# Patient Record
Sex: Male | Born: 1945 | Race: Black or African American | Hispanic: No | Marital: Married | Smoking: Former smoker
Health system: Southern US, Community
[De-identification: ages and names within clinical notes are randomized; demographics above are authoritative.]

## PROBLEM LIST (undated history)

## (undated) DIAGNOSIS — R972 Elevated prostate specific antigen [PSA]: Secondary | ICD-10-CM

## (undated) DIAGNOSIS — E538 Deficiency of other specified B group vitamins: Secondary | ICD-10-CM

## (undated) DIAGNOSIS — H9319 Tinnitus, unspecified ear: Secondary | ICD-10-CM

## (undated) DIAGNOSIS — R350 Frequency of micturition: Secondary | ICD-10-CM

## (undated) DIAGNOSIS — Z973 Presence of spectacles and contact lenses: Secondary | ICD-10-CM

## (undated) DIAGNOSIS — Z8619 Personal history of other infectious and parasitic diseases: Secondary | ICD-10-CM

## (undated) DIAGNOSIS — R351 Nocturia: Secondary | ICD-10-CM

## (undated) DIAGNOSIS — Z87898 Personal history of other specified conditions: Secondary | ICD-10-CM

## (undated) DIAGNOSIS — E785 Hyperlipidemia, unspecified: Secondary | ICD-10-CM

## (undated) DIAGNOSIS — M25551 Pain in right hip: Secondary | ICD-10-CM

## (undated) DIAGNOSIS — H9190 Unspecified hearing loss, unspecified ear: Secondary | ICD-10-CM

## (undated) DIAGNOSIS — M1990 Unspecified osteoarthritis, unspecified site: Secondary | ICD-10-CM

## (undated) DIAGNOSIS — Z86010 Personal history of colonic polyps: Secondary | ICD-10-CM

## (undated) DIAGNOSIS — T8859XA Other complications of anesthesia, initial encounter: Secondary | ICD-10-CM

## (undated) DIAGNOSIS — K429 Umbilical hernia without obstruction or gangrene: Secondary | ICD-10-CM

## (undated) DIAGNOSIS — K432 Incisional hernia without obstruction or gangrene: Secondary | ICD-10-CM

## (undated) DIAGNOSIS — R05 Cough: Secondary | ICD-10-CM

## (undated) DIAGNOSIS — M2550 Pain in unspecified joint: Secondary | ICD-10-CM

## (undated) DIAGNOSIS — M545 Low back pain, unspecified: Secondary | ICD-10-CM

## (undated) DIAGNOSIS — R490 Dysphonia: Secondary | ICD-10-CM

## (undated) DIAGNOSIS — J4489 Other specified chronic obstructive pulmonary disease: Secondary | ICD-10-CM

## (undated) DIAGNOSIS — M25561 Pain in right knee: Secondary | ICD-10-CM

## (undated) DIAGNOSIS — M791 Myalgia, unspecified site: Secondary | ICD-10-CM

## (undated) DIAGNOSIS — R0602 Shortness of breath: Secondary | ICD-10-CM

## (undated) DIAGNOSIS — R062 Wheezing: Secondary | ICD-10-CM

## (undated) DIAGNOSIS — E119 Type 2 diabetes mellitus without complications: Secondary | ICD-10-CM

## (undated) DIAGNOSIS — R059 Cough, unspecified: Secondary | ICD-10-CM

## (undated) DIAGNOSIS — M6289 Other specified disorders of muscle: Secondary | ICD-10-CM

## (undated) DIAGNOSIS — Z860101 Personal history of adenomatous and serrated colon polyps: Secondary | ICD-10-CM

## (undated) DIAGNOSIS — J449 Chronic obstructive pulmonary disease, unspecified: Secondary | ICD-10-CM

## (undated) DIAGNOSIS — K219 Gastro-esophageal reflux disease without esophagitis: Secondary | ICD-10-CM

## (undated) DIAGNOSIS — M436 Torticollis: Secondary | ICD-10-CM

## (undated) DIAGNOSIS — G479 Sleep disorder, unspecified: Secondary | ICD-10-CM

## (undated) DIAGNOSIS — E559 Vitamin D deficiency, unspecified: Secondary | ICD-10-CM

## (undated) DIAGNOSIS — I10 Essential (primary) hypertension: Secondary | ICD-10-CM

## (undated) DIAGNOSIS — C801 Malignant (primary) neoplasm, unspecified: Secondary | ICD-10-CM

## (undated) DIAGNOSIS — H43399 Other vitreous opacities, unspecified eye: Secondary | ICD-10-CM

## (undated) DIAGNOSIS — J45909 Unspecified asthma, uncomplicated: Secondary | ICD-10-CM

## (undated) HISTORY — DX: Wheezing: R06.2

## (undated) HISTORY — DX: Malignant (primary) neoplasm, unspecified: C80.1

## (undated) HISTORY — DX: Unspecified osteoarthritis, unspecified site: M19.90

## (undated) HISTORY — DX: Cough: R05

## (undated) HISTORY — DX: Pain in right knee: M25.561

## (undated) HISTORY — DX: Vitamin D deficiency, unspecified: E55.9

## (undated) HISTORY — DX: Hyperlipidemia, unspecified: E78.5

## (undated) HISTORY — DX: Frequency of micturition: R35.0

## (undated) HISTORY — DX: Umbilical hernia without obstruction or gangrene: K42.9

## (undated) HISTORY — DX: Low back pain: M54.5

## (undated) HISTORY — DX: Shortness of breath: R06.02

## (undated) HISTORY — PX: HEMORRHOID SURGERY: SHX153

## (undated) HISTORY — DX: Deficiency of other specified B group vitamins: E53.8

## (undated) HISTORY — DX: Type 2 diabetes mellitus without complications: E11.9

## (undated) HISTORY — DX: Essential (primary) hypertension: I10

## (undated) HISTORY — DX: Sleep disorder, unspecified: G47.9

## (undated) HISTORY — DX: Pain in right hip: M25.551

## (undated) HISTORY — DX: Dysphonia: R49.0

## (undated) HISTORY — DX: Torticollis: M43.6

## (undated) HISTORY — DX: Unspecified asthma, uncomplicated: J45.909

## (undated) HISTORY — DX: Other specified disorders of muscle: M62.89

## (undated) HISTORY — PX: KNEE ARTHROSCOPY: SUR90

## (undated) HISTORY — DX: Unspecified hearing loss, unspecified ear: H91.90

## (undated) HISTORY — DX: Tinnitus, unspecified ear: H93.19

## (undated) HISTORY — DX: Myalgia, unspecified site: M79.10

## (undated) HISTORY — DX: Pain in unspecified joint: M25.50

## (undated) HISTORY — DX: Low back pain, unspecified: M54.50

## (undated) HISTORY — DX: Other vitreous opacities, unspecified eye: H43.399

## (undated) HISTORY — PX: ESOPHAGOGASTRODUODENOSCOPY: SHX1529

## (undated) HISTORY — DX: Cough, unspecified: R05.9

---

## 1972-07-09 HISTORY — PX: HEMORRHOID SURGERY: SHX153

## 1983-07-10 HISTORY — PX: KNEE ARTHROSCOPY: SUR90

## 1988-07-09 HISTORY — PX: KNEE ARTHROSCOPY: SUR90

## 1998-07-09 HISTORY — PX: COLONOSCOPY: SHX174

## 1998-07-09 HISTORY — PX: PROSTATE SURGERY: SHX751

## 2001-05-20 ENCOUNTER — Encounter: Payer: Self-pay | Admitting: Internal Medicine

## 2001-05-20 ENCOUNTER — Ambulatory Visit (HOSPITAL_COMMUNITY): Admission: RE | Admit: 2001-05-20 | Discharge: 2001-05-20 | Payer: Self-pay | Admitting: Internal Medicine

## 2001-10-08 ENCOUNTER — Encounter: Payer: Self-pay | Admitting: Emergency Medicine

## 2001-10-08 ENCOUNTER — Emergency Department (HOSPITAL_COMMUNITY): Admission: EM | Admit: 2001-10-08 | Discharge: 2001-10-08 | Payer: Self-pay | Admitting: Emergency Medicine

## 2002-09-09 ENCOUNTER — Ambulatory Visit (HOSPITAL_COMMUNITY): Admission: RE | Admit: 2002-09-09 | Discharge: 2002-09-09 | Payer: Self-pay | Admitting: Orthopaedic Surgery

## 2002-09-09 ENCOUNTER — Encounter: Payer: Self-pay | Admitting: Orthopaedic Surgery

## 2002-11-26 ENCOUNTER — Ambulatory Visit (HOSPITAL_COMMUNITY)
Admission: RE | Admit: 2002-11-26 | Discharge: 2002-11-26 | Payer: Self-pay | Admitting: Physical Medicine and Rehabilitation

## 2002-11-26 ENCOUNTER — Encounter: Payer: Self-pay | Admitting: Physical Medicine and Rehabilitation

## 2003-02-01 ENCOUNTER — Encounter: Payer: Self-pay | Admitting: Emergency Medicine

## 2003-02-01 ENCOUNTER — Emergency Department (HOSPITAL_COMMUNITY): Admission: EM | Admit: 2003-02-01 | Discharge: 2003-02-01 | Payer: Self-pay | Admitting: Emergency Medicine

## 2005-07-09 DIAGNOSIS — C61 Malignant neoplasm of prostate: Secondary | ICD-10-CM

## 2005-07-09 DIAGNOSIS — R9721 Rising PSA following treatment for malignant neoplasm of prostate: Secondary | ICD-10-CM

## 2005-07-09 HISTORY — DX: Malignant neoplasm of prostate: C61

## 2005-07-09 HISTORY — DX: Rising PSA following treatment for malignant neoplasm of prostate: R97.21

## 2005-07-09 HISTORY — PX: PROSTATECTOMY: SHX69

## 2006-07-09 HISTORY — PX: COLONOSCOPY: SHX174

## 2007-02-11 ENCOUNTER — Ambulatory Visit (HOSPITAL_COMMUNITY): Admission: RE | Admit: 2007-02-11 | Discharge: 2007-02-11 | Payer: Self-pay | Admitting: Internal Medicine

## 2007-02-27 ENCOUNTER — Ambulatory Visit (HOSPITAL_COMMUNITY): Admission: RE | Admit: 2007-02-27 | Discharge: 2007-02-27 | Payer: Self-pay | Admitting: Internal Medicine

## 2007-02-27 ENCOUNTER — Ambulatory Visit: Payer: Self-pay | Admitting: Internal Medicine

## 2007-02-28 ENCOUNTER — Encounter: Payer: Self-pay | Admitting: Internal Medicine

## 2008-05-03 ENCOUNTER — Ambulatory Visit (HOSPITAL_COMMUNITY): Admission: RE | Admit: 2008-05-03 | Discharge: 2008-05-03 | Payer: Self-pay | Admitting: Internal Medicine

## 2008-08-16 ENCOUNTER — Ambulatory Visit (HOSPITAL_COMMUNITY): Admission: RE | Admit: 2008-08-16 | Discharge: 2008-08-16 | Payer: Self-pay | Admitting: Internal Medicine

## 2008-12-26 IMAGING — CR DG CHEST 2V
2 series · 2 of 2 positions shown · non-contrast
Comparison: None.

Exam: Chest, 2 views.

HISTORY: Bronchitis.

[view not recorded (1 of 2)]
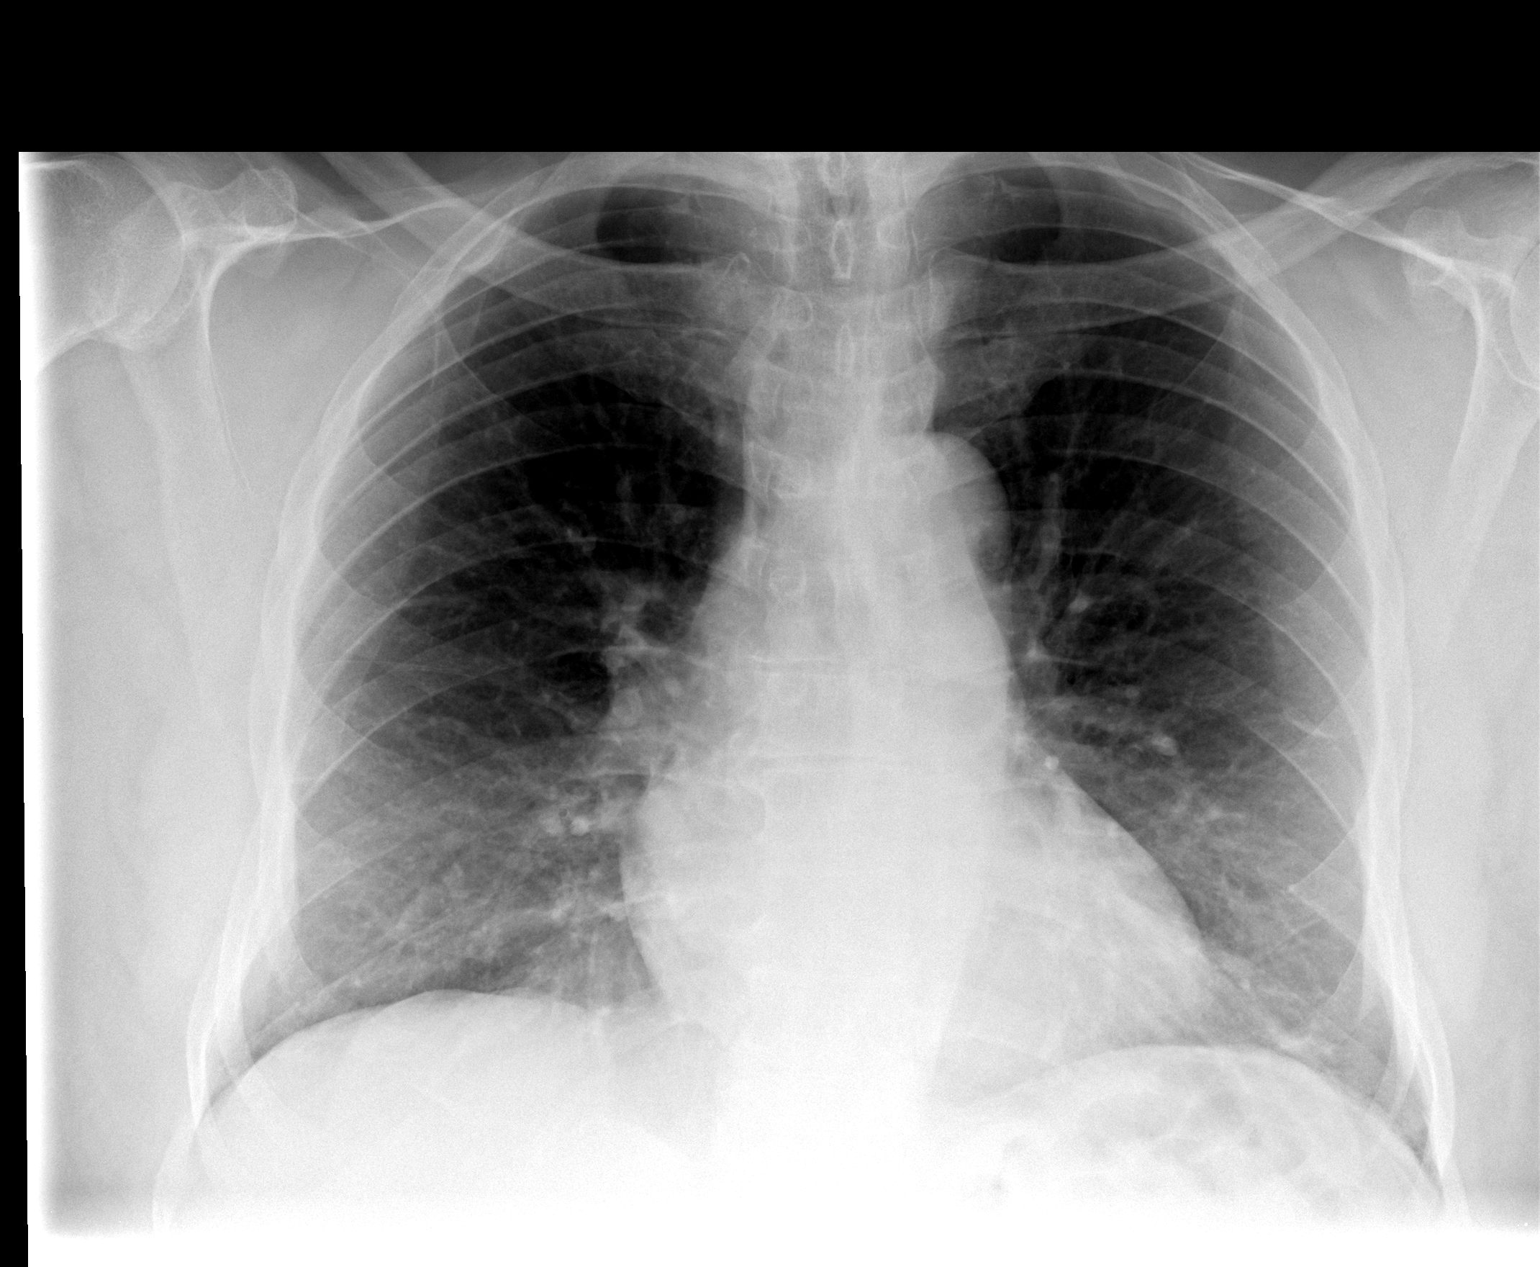

[view not recorded (2 of 2)]
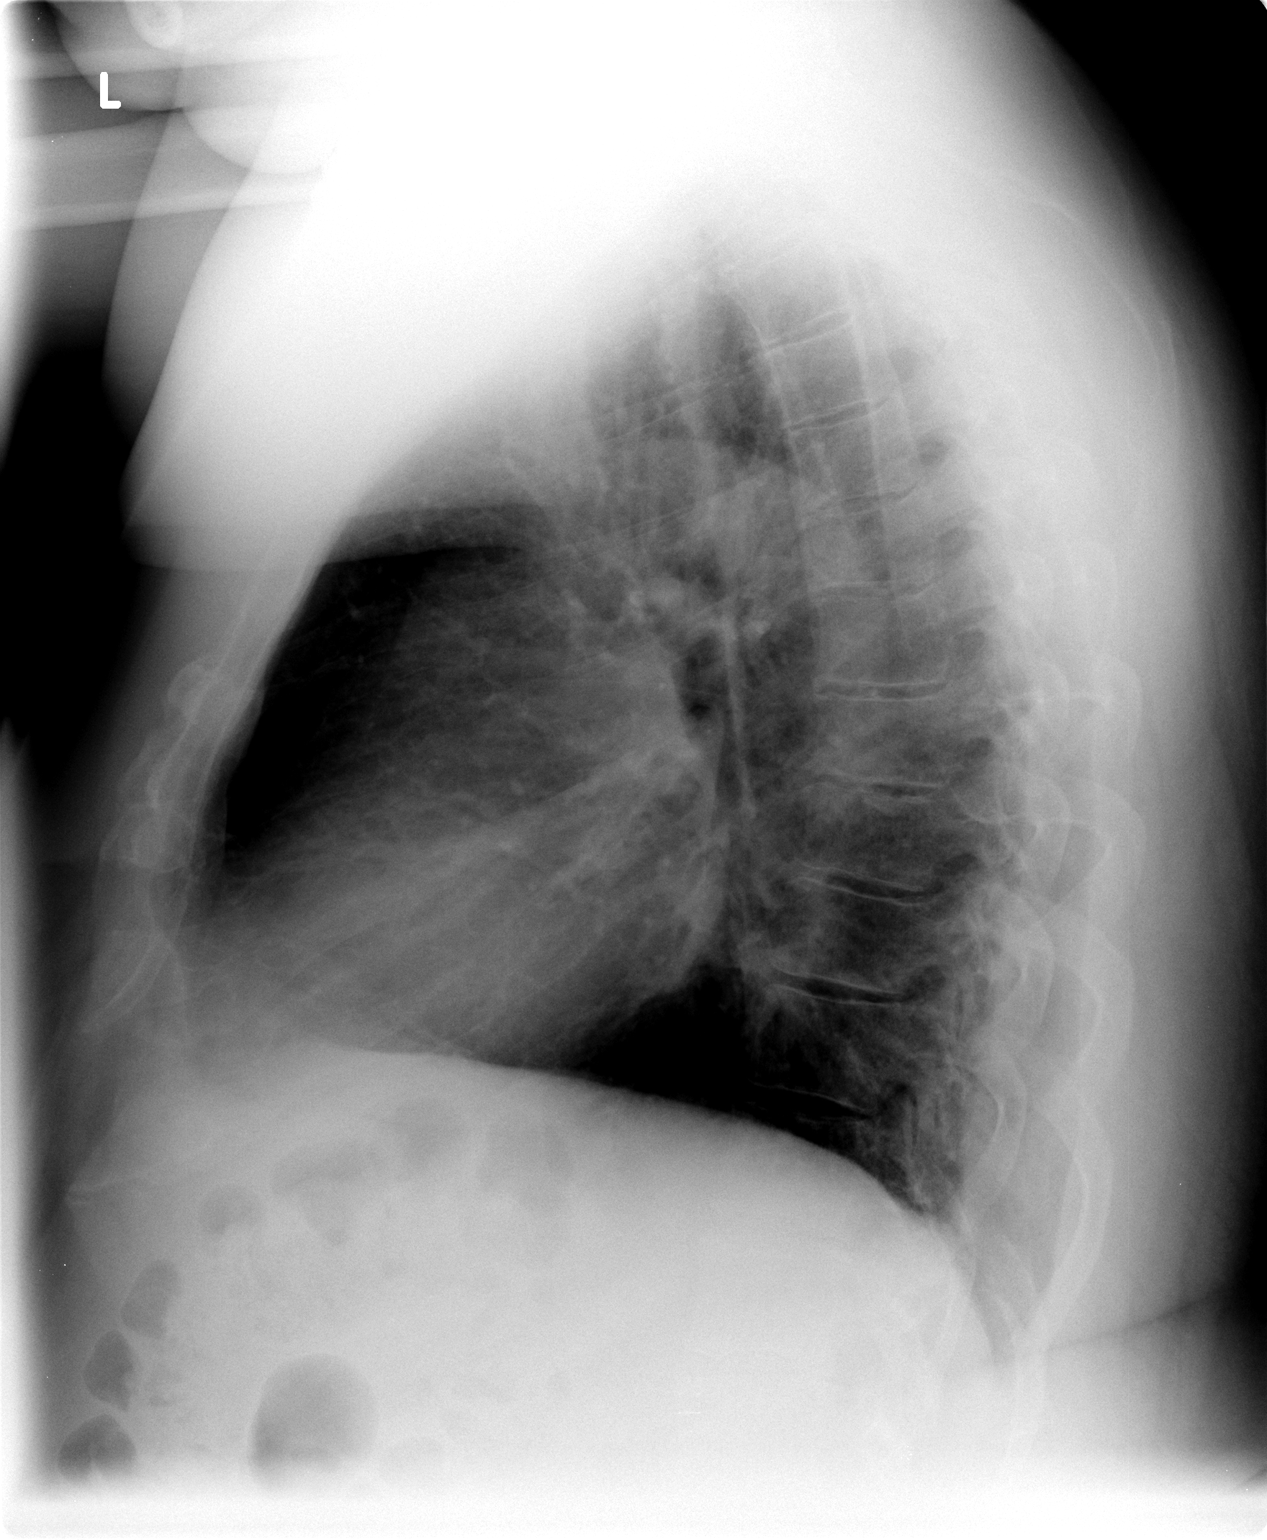

[2 of 2 positions shown; findings below may reference images not displayed]

FINDINGS: Heart size is normal.

There is no pleural fluid or pulmonary edema. 

Bronchitic changes including bronchial wall thickening is noted.

At the left bases there is scar versus atelectasis.
IMPRESSION: 1. Left base scar versus atelectasis.
2. Chronic bronchitic changes.

## 2010-11-21 NOTE — Op Note (Signed)
NAME:  Travis Blanchard, Travis Blanchard NO.:  [id number]   MEDICAL RECORD NO.:  [id number]          PATIENT TYPE:  AMB   LOCATION:  DAY                           FACILITY:  [facility]   PHYSICIAN:  R. Roetta Sessions, M.D. DATE OF BIRTH:  June 01, 1946   DATE OF PROCEDURE:  02/27/2007  DATE OF DISCHARGE:                               OPERATIVE REPORT   PROCEDURE PERFORMED:  Colonoscopy with biopsy.   INDICATIONS FOR PROCEDURE:  The patient is a 65 year old African-  American male sent here through the courtesy of Dr. Felecia Shelling for colorectal  cancer screening. He had a colonoscopy by Dr. Jani Files in January of  2000, had both hyperplastic and adenomas biopsied, removed from his  sigmoid colon, and there was concern that some portion of a flat polyp  was left behind and it was recommended that he come back for another  colonoscopy in 1 or 2 years. He has not had a follow-up colonoscopy. He  has no lower GI tract symptoms. There is no family history of colorectal  complaint. Colonoscopy is now being done .This approach has been  discussed with the patient at length. The potential risks, benefits,  alternatives have been reviewed, questions answered. He is agreeable.  Please see documentation in medical record.   PROCEDURE IN DETAIL:  Oxygen saturation, blood pressure, pulse,  respirations monitored throughout the entire procedure.   CONSCIOUS SEDATION:  Versed 4 mg IV, Demerol 100 mg IV in divided doses.   INSTRUMENT:  Pentax video chip system.   FINDINGS:  Digital rectal exam revealed no abnormalities.   ENDOSCOPIC FINDINGS:  Prep was adequate.   COLON:  The colonic mucosa was surveyed from the rectosigmoid junction  through the left transverse and right colon to the appendiceal orifice,  ileocecal valve and cecum. These structures were well seen and  photographed for the record.   From this level the scope was withdrawn. Previously treated mucosal  surfaces were again seen. There is a  thin coating of stool-stained  mucous along the way intermittently which was easily washed away. The  colonic mucosa down to the rectum appeared normal. The scope was pulled  down to the rectum. A thorough examination of the rectal mucosa  including a retroflexed view of the anal verge demonstrated 2 diminutive  polyps which were cold biopsied/removed. The remainder of the rectal and  colonic mucosa appeared normal.   The patient tolerated the procedure well and was reacted in endoscopy.   IMPRESSION:  1. Diminutive polyps in the rectum (2), status post cold      biopsy/removal. Otherwise normal rectum.  2. Normal colon.   RECOMMENDATIONS:  1. Follow up on path.  2. Further recommendations to follow.      Jonathon Bellows, M.D.  Electronically Signed     RMR/MEDQ  D:  02/27/2007  T:  02/28/2007  Job:  [id number]   cc:   Tesfaye D. Felecia Shelling, MD  Fax: [phone]

## 2012-01-22 ENCOUNTER — Encounter: Payer: Self-pay | Admitting: Internal Medicine

## 2012-11-11 DIAGNOSIS — E119 Type 2 diabetes mellitus without complications: Secondary | ICD-10-CM | POA: Diagnosis not present

## 2012-11-11 DIAGNOSIS — I10 Essential (primary) hypertension: Secondary | ICD-10-CM | POA: Diagnosis not present

## 2012-11-11 DIAGNOSIS — E669 Obesity, unspecified: Secondary | ICD-10-CM | POA: Diagnosis not present

## 2012-11-14 DIAGNOSIS — E7800 Pure hypercholesterolemia, unspecified: Secondary | ICD-10-CM | POA: Diagnosis not present

## 2012-11-14 DIAGNOSIS — I10 Essential (primary) hypertension: Secondary | ICD-10-CM | POA: Diagnosis not present

## 2012-11-14 DIAGNOSIS — E119 Type 2 diabetes mellitus without complications: Secondary | ICD-10-CM | POA: Diagnosis not present

## 2013-01-13 DIAGNOSIS — J309 Allergic rhinitis, unspecified: Secondary | ICD-10-CM | POA: Diagnosis not present

## 2013-01-13 DIAGNOSIS — E119 Type 2 diabetes mellitus without complications: Secondary | ICD-10-CM | POA: Diagnosis not present

## 2013-01-13 DIAGNOSIS — E669 Obesity, unspecified: Secondary | ICD-10-CM | POA: Diagnosis not present

## 2013-03-11 ENCOUNTER — Ambulatory Visit (INDEPENDENT_AMBULATORY_CARE_PROVIDER_SITE_OTHER): Payer: Medicare Other | Admitting: General Surgery

## 2013-03-11 ENCOUNTER — Encounter (INDEPENDENT_AMBULATORY_CARE_PROVIDER_SITE_OTHER): Payer: Self-pay | Admitting: General Surgery

## 2013-03-11 VITALS — BP 122/72 | HR 82 | Temp 97.8°F | Ht 70.0 in | Wt 305.8 lb

## 2013-03-11 DIAGNOSIS — K432 Incisional hernia without obstruction or gangrene: Secondary | ICD-10-CM | POA: Insufficient documentation

## 2013-03-11 NOTE — Patient Instructions (Signed)
Call the office when you have decided what you would like to do. Remember to try to call 6 weeks before your desired surgical date If you would like more information about weight loss surgery, please call the office  Laparoscopic Ventral Hernia Repair Laparoscopic ventral hernia repairis a surgery to fix a ventral hernia. Aventral hernia, also called an incisional hernia, is a bulge of body tissue or intestines that pushes through the front part of the abdomen. This can happen if the connective tissue covering the muscles over the abdomen has a weak spot or is torn because of a surgical cut (incision) from a previous surgery. Laparoscopic ventral hernia repair is often done soon after diagnosis to stop the hernia from getting bigger, becoming uncomfortable, or becoming an emergency. This surgery usually takes about 2 hours, but the time can vary greatly. LET YOUR CAREGIVER KNOW ABOUT:  Any allergies you have.  All medicines you are taking, including steroids, vitamins, herbs, eyedrops, and over-the-counter medicines and creams.  Previous problems you or members of your family have had with the use of anesthetics.  Any blood disorders or bleeding problems you have had.  Past surgeries you have had.  Other health problems you have. RISKS AND COMPLICATIONS  Generally, laparoscopic ventral hernia repair is a safe procedure. However, as with any surgical procedure, complications can occur. Possible complications include:  Bleeding.  Trouble passing urine or having a bowel movement after the operation.  Infection.  Pneumonia.  Blood clots.  Pain in the area of the hernia.  A bulge in the area of the hernia that may be caused by a collection of fluid.  Injury to intestines or other structures in the abdomen.  Return of the hernia after surgery. In some cases, the caregiver may need to stop the laparoscopic procedure and do regular, open surgery. This may be necessary for very  difficult hernias, when organs are hard to see, or when bleeding problems occur during surgery. BEFORE THE PROCEDURE   You may need to have blood tests, urine tests, a chest X-ray, or electrocardiography done before the day of the surgery.  Ask your caregiver about changing or stopping your regular medicines.  You may need to wash with a special type of germ-killing soap.  Do not eat or drink anything for at least 6 hours before the surgery.  Make plans to have someone drive you home after the procedure. PROCEDURE   Small monitors will be put on your body. They are used to check your heart, blood pressure, and oxygen level.  An intravenous (IV) access tube will be put into a vein in your hand or arm. Fluids and medicine will flow directly into your body through the IV tube.  You will be given medicine to make you sleep through the procedure (general anesthetic).  Your abdomen will be cleaned with a special soap to kill any germs on your skin.  Once you are asleep, several small incisions will be made in your abdomen.  The large space in your abdomen will be filled with air so that it expands. This gives the caregiver more room and a better view.  A thin, lighted tube with a tiny camera on the end (laparoscope) is put through a small incision in your abdomen. The camera on the laparoscope sends a picture to a TV screen in the operating room. This gives the caregiver a good view inside the abdomen.  Hollow tubes are put through the other small incisions in your abdomen.  The tools needed for the procedure are put through these tubes.  The caregiver puts the tissue or intestines that formed the hernia back in place.  A screen-like patch (mesh) is used to close the hernia. This helps make the area stronger. Stitches, tacks, or staples are used to keep the mesh in place.  Medicine and a bandage (dressing) or skin glue will be put over the incisions. AFTER THE PROCEDURE   You will stay  in a recovery area until the anesthetic wears off. Your blood pressure and pulse will be checked often.  You may be able to go home the same day or may need to stay in the hospital for 1 or 2 days after this surgery. Your caregiver will decide when you can go home.  You may feel some pain. You will likely be given medicine for pain.  You will be urged to do breathing exercises that involve taking deep breaths. This helps prevent a lung infection after a surgery.  You may have to wear compression stockings while you are in the hospital. These stockings help keep blood clots from forming in your legs. Document Released: 06/11/2012 Document Reviewed: 04/16/2012 [facility] Patient Information 2014 [place], [place].

## 2013-03-11 NOTE — Progress Notes (Signed)
Patient ID: Travis Blanchard, male   DOB: February 09, 1946, 67 y.o.   MRN: [id number]  Chief Complaint  Patient presents with  . New Evaluation    eval hernia    HPI Travis Blanchard is a 67 y.o. male.   HPI 67 year old morbidly obese African American male self-referred himself for evaluation of a ventral hernia. Patient states that the hernia is been there for at least 12 years. He states that it has grown in size over the past Several years. He states weekend before last he was doing some yard work when he developed significant periumbilical pain. He states that it was a hard lump. States it is difficult and tender to touch. He also has some nausea. The pain resolved in the hernia And it became soft. He denies any fever, chills, vomiting, diarrhea or constipation. He denies any melena or hematochezia. He had an open prostatectomy many years ago. He is diabetic and has hypertension. Also takes medication for high cholesterol.  Past Medical History  Diagnosis Date  . Arthritis   . Cancer     Prostate  . Diabetes mellitus without complication   . Hyperlipidemia   . Hypertension     Past Surgical History  Procedure Laterality Date  . Prostate surgery  2000    Prostate removal/Cancer    Family History  Problem Relation Age of Onset  . Diabetes Mother   . Hypertension Mother   . Cancer Sister     Breast    Social History History  Substance Use Topics  . Smoking status: Former Smoker    Quit date: 03/11/2002  . Smokeless tobacco: Not on file  . Alcohol Use: Yes     Comment: social    Allergies  Allergen Reactions  . Penicillins Hives and Itching    Current Outpatient Prescriptions  Medication Sig Dispense Refill  . atorvastatin (LIPITOR) 20 MG tablet       . lisinopril (PRINIVIL,ZESTRIL) 5 MG tablet       . metFORMIN (GLUCOPHAGE) 500 MG tablet       . zolpidem (AMBIEN) 5 MG tablet        No current facility-administered medications for this visit.    Review of  Systems Review of Systems  Constitutional: Negative for fever, chills, appetite change and unexpected weight change.  HENT: Positive for hearing loss. Negative for congestion and trouble swallowing.   Eyes: Negative for visual disturbance.  Respiratory: Negative for chest tightness and shortness of breath.   Cardiovascular: Negative for chest pain and leg swelling.       No PND, no orthopnea, no DOE  Gastrointestinal:       See HPI  Genitourinary: Negative for dysuria and hematuria.  Musculoskeletal: Negative.        Joint pains  Skin: Negative for rash.  Neurological: Negative for seizures and speech difficulty.       Denies TIAs and amaurosis fugax  Hematological: Does not bruise/bleed easily.  Psychiatric/Behavioral: Negative for behavioral problems and confusion.    Blood pressure 122/72, pulse 82, temperature 97.8 F (36.6 C), temperature source Temporal, height 5\' 10"  (1.778 m), weight 305 lb 12.8 oz (138.71 kg), SpO2 96.00%.  Physical Exam Physical Exam  Vitals reviewed. Constitutional: He is oriented to person, place, and time. He appears well-developed and well-nourished. No distress.  Morbidly obese  HENT:  Head: Normocephalic and atraumatic.  Right Ear: External ear normal.  Left Ear: External ear normal.  Eyes: Conjunctivae are normal. No scleral icterus.  Neck: Normal range of motion. Neck supple. No tracheal deviation present. No thyromegaly present.  Cardiovascular: Normal rate, normal heart sounds and intact distal pulses.   Pulmonary/Chest: Effort normal and breath sounds normal. No respiratory distress. He has no wheezes.  Abdominal: Soft. Bowel sounds are normal. He exhibits no distension. There is no tenderness. There is no rebound and no guarding.    Periumbilical ventral incisional hernia. Reducible. Difficult to determine size of defect due to body habitus. At least 4 cm, maybe 5cm. Soft  Musculoskeletal: Normal range of motion. He exhibits no edema and  no tenderness.  Lymphadenopathy:    He has no cervical adenopathy.  Neurological: He is alert and oriented to person, place, and time. He exhibits normal muscle tone.  Skin: Skin is warm and dry. No rash noted. He is not diaphoretic. No erythema. No pallor.  Psychiatric: He has a normal mood and affect. His behavior is normal. Judgment and thought content normal.    Data Reviewed None available  Assessment    Ventral incisional hernia     Plan    We discussed the etiology of ventral Incisional hernias. We discussed the signs and symptoms of incarceration and strangulation. The patient was given educational material. I also drew diagrams.  We discussed nonoperative and operative management. With respect to operative management, we discussed both open repair and laparoscopic repair. We discussed the pros and cons of each approach. I discussed the typical aftercare with each procedure and how each procedure differs.  The patient Is leaning toward a Laparoscopic repair of ventral incisional hernia repair with mesh  We discussed the risk and benefits of surgery including but not limited to bleeding, infection, injury to surrounding structures, hernia recurrence, mesh complications, hematoma/seroma formation, need to convert to an open procedure, blood clot formation, urinary retention, post operative ileus, general anesthesia risk, long-term abdominal pain. We discussed that this procedure can be quite uncomfortable and difficult to recover from based on how the mesh is secured to the abdominal wall. We discussed the importance of avoiding heavy lifting and straining for a period of 6 weeks.  I explained that he is at higher risk for recurrence given his current weight as well as his diabetes  We did also discuss attempts at weight loss prior to surgical intervention. We also discussed weight loss surgery such as gastric bypass And sleeve gastrectomy. I suggested hime attending one of our  weight-loss surgeryseminars as well as one of the weight-loss surgery support group meetings in order to gain more information about weight loss surgery.   Right now is more interested in hernia surgery.   He states he will contact our office with how he would like to proceed in the next few days  Mary Sella. Andrey Campanile, MD, FACS General, Bariatric, & Minimally Invasive Surgery [facility], [place]          [facility] M 03/11/2013, 12:04 PM

## 2013-03-30 DIAGNOSIS — J410 Simple chronic bronchitis: Secondary | ICD-10-CM | POA: Diagnosis not present

## 2013-04-03 DIAGNOSIS — J449 Chronic obstructive pulmonary disease, unspecified: Secondary | ICD-10-CM | POA: Diagnosis not present

## 2013-04-03 DIAGNOSIS — Z23 Encounter for immunization: Secondary | ICD-10-CM | POA: Diagnosis not present

## 2013-04-06 ENCOUNTER — Other Ambulatory Visit (HOSPITAL_COMMUNITY): Payer: Self-pay | Admitting: Internal Medicine

## 2013-04-06 ENCOUNTER — Ambulatory Visit (HOSPITAL_COMMUNITY)
Admission: RE | Admit: 2013-04-06 | Discharge: 2013-04-06 | Disposition: A | Discharge status: Home / Self Care | Payer: Medicare Other | Source: Ambulatory Visit | Attending: Internal Medicine | Admitting: Internal Medicine

## 2013-04-06 DIAGNOSIS — R059 Cough, unspecified: Secondary | ICD-10-CM | POA: Diagnosis not present

## 2013-04-06 DIAGNOSIS — J449 Chronic obstructive pulmonary disease, unspecified: Secondary | ICD-10-CM | POA: Diagnosis not present

## 2013-04-06 DIAGNOSIS — E119 Type 2 diabetes mellitus without complications: Secondary | ICD-10-CM | POA: Diagnosis not present

## 2013-04-06 DIAGNOSIS — I10 Essential (primary) hypertension: Secondary | ICD-10-CM | POA: Diagnosis not present

## 2013-04-06 DIAGNOSIS — R05 Cough: Secondary | ICD-10-CM | POA: Insufficient documentation

## 2013-04-06 DIAGNOSIS — J9819 Other pulmonary collapse: Secondary | ICD-10-CM | POA: Diagnosis not present

## 2013-04-24 ENCOUNTER — Encounter (HOSPITAL_COMMUNITY): Payer: Self-pay | Admitting: Emergency Medicine

## 2013-04-24 ENCOUNTER — Emergency Department (HOSPITAL_COMMUNITY)
Admission: EM | Admit: 2013-04-24 | Discharge: 2013-04-24 | Disposition: A | Discharge status: Home / Self Care | Payer: Medicare Other | Attending: Emergency Medicine | Admitting: Emergency Medicine

## 2013-04-24 DIAGNOSIS — Z79899 Other long term (current) drug therapy: Secondary | ICD-10-CM | POA: Insufficient documentation

## 2013-04-24 DIAGNOSIS — E785 Hyperlipidemia, unspecified: Secondary | ICD-10-CM | POA: Insufficient documentation

## 2013-04-24 DIAGNOSIS — E119 Type 2 diabetes mellitus without complications: Secondary | ICD-10-CM | POA: Insufficient documentation

## 2013-04-24 DIAGNOSIS — Z8546 Personal history of malignant neoplasm of prostate: Secondary | ICD-10-CM | POA: Insufficient documentation

## 2013-04-24 DIAGNOSIS — H209 Unspecified iridocyclitis: Secondary | ICD-10-CM | POA: Insufficient documentation

## 2013-04-24 DIAGNOSIS — IMO0002 Reserved for concepts with insufficient information to code with codable children: Secondary | ICD-10-CM | POA: Diagnosis not present

## 2013-04-24 DIAGNOSIS — Z87891 Personal history of nicotine dependence: Secondary | ICD-10-CM | POA: Diagnosis not present

## 2013-04-24 DIAGNOSIS — I10 Essential (primary) hypertension: Secondary | ICD-10-CM | POA: Diagnosis not present

## 2013-04-24 DIAGNOSIS — J449 Chronic obstructive pulmonary disease, unspecified: Secondary | ICD-10-CM | POA: Diagnosis not present

## 2013-04-24 DIAGNOSIS — Z8739 Personal history of other diseases of the musculoskeletal system and connective tissue: Secondary | ICD-10-CM | POA: Diagnosis not present

## 2013-04-24 DIAGNOSIS — Z880 Allergy status to penicillin: Secondary | ICD-10-CM | POA: Insufficient documentation

## 2013-04-24 DIAGNOSIS — H2000 Unspecified acute and subacute iridocyclitis: Secondary | ICD-10-CM | POA: Diagnosis not present

## 2013-04-24 DIAGNOSIS — J4489 Other specified chronic obstructive pulmonary disease: Secondary | ICD-10-CM | POA: Insufficient documentation

## 2013-04-24 HISTORY — DX: Chronic obstructive pulmonary disease, unspecified: J44.9

## 2013-04-24 MED ORDER — FLUORESCEIN SODIUM 1 MG OP STRP
ORAL_STRIP | OPHTHALMIC | Status: AC
  Administered 2013-04-24: 13:00:00
  Filled 2013-04-24: qty 1

## 2013-04-24 MED ORDER — CYCLOPENTOLATE HCL 1 % OP SOLN: 1.0000 [drp] | Freq: Two times a day (BID) | OPHTHALMIC | Status: AC

## 2013-04-24 MED ORDER — ERYTHROMYCIN 5 MG/GM OP OINT
TOPICAL_OINTMENT | Freq: Once | OPHTHALMIC | Status: AC
  Administered 2013-04-24: 13:00:00 via OPHTHALMIC

## 2013-04-24 MED ORDER — TETRACAINE HCL 0.5 % OP SOLN
2.0000 [drp] | Freq: Once | OPHTHALMIC | Status: AC
  Administered 2013-04-24: 2 [drp] via OPHTHALMIC
  Filled 2013-04-24: qty 2

## 2013-04-24 MED ORDER — ERYTHROMYCIN 5 MG/GM OP OINT
TOPICAL_OINTMENT | OPHTHALMIC | Status: AC
  Filled 2013-04-24: qty 3.5

## 2013-04-24 MED ORDER — PREDNISOLONE ACETATE 1 % OP SUSP: 1.0000 [drp] | Freq: Three times a day (TID) | OPHTHALMIC | Status: AC

## 2013-04-24 NOTE — ED Notes (Addendum)
Patient states he has been dealing with sinus congestion x 1 month after spraying for spiders w/Raid.  Over last 2 weeks has taken Zyrtec, Prednisone, nasal spray and started Xyzal on Monday. Sunday night cleaned his corner shower w/Pine-Sol and Ajax before showering and going to bed. Eye irritation and watering began Monday. Denies any fevers or purulent drainage.  He has been treating his dog's eye infection x 1 year w/eye drops - states vet told him the name of the infection was "Pannus".

## 2013-04-24 NOTE — ED Notes (Signed)
Patient with no complaints at this time. Respirations even and unlabored. Skin warm/dry. Discharge instructions reviewed with patient at this time. Patient given opportunity to voice concerns/ask questions. Patient discharged at this time and left Emergency Department with steady gait.   

## 2013-04-24 NOTE — ED Notes (Signed)
Both eyes red and tearing  Since Monday, No known injury.  Pt is treating his dog with eye infection with eye drops.

## 2013-04-24 NOTE — ED Provider Notes (Signed)
CSN: [id number]     Arrival date & time 04/24/13  1212 History   First MD Initiated Contact with Patient 04/24/13 1227     Chief Complaint  Patient presents with  . Eye Pain    Patient is a 67 y.o. male presenting with eye pain. The history is provided by the patient.  Eye Pain This is a new problem. The current episode started more than 2 days ago. The problem occurs daily. The problem has been gradually worsening. Pertinent negatives include no headaches. Exacerbated by: light. The symptoms are relieved by rest.  pt reports bilateral eye pain and redness for a week No contact use No trauma to eye No h/o eye surgery and no h/o glaucoma No significant eye drainage Reports treating dogs eye infection for a month but does not think he has been exposed to this Also reports recent cleaning his shower with pine-sol and Ajax but no direct exposure to his eyes   Past Medical History  Diagnosis Date  . Arthritis   . Diabetes mellitus without complication   . Hyperlipidemia   . Hypertension   . Cancer     Prostate  . COPD (chronic obstructive pulmonary disease)    Past Surgical History  Procedure Laterality Date  . Prostate surgery  2000    Prostate removal/Cancer   Family History  Problem Relation Age of Onset  . Diabetes Mother   . Hypertension Mother   . Cancer Sister     Breast   History  Substance Use Topics  . Smoking status: Former Smoker    Quit date: 03/11/2002  . Smokeless tobacco: Not on file  . Alcohol Use: Yes     Comment: social    Review of Systems  Eyes: Positive for pain.  Neurological: Negative for headaches.    Allergies  Penicillins  Home Medications   Current Outpatient Rx  Name  Route  Sig  Dispense  Refill  . atorvastatin (LIPITOR) 20 MG tablet   Oral   Take 20 mg by mouth at bedtime.          [name] lisinopril (PRINIVIL,ZESTRIL) 5 MG tablet   Oral   Take 5 mg by mouth every morning.          . metFORMIN (GLUCOPHAGE) 500 MG tablet  Oral   Take 500 mg by mouth 2 (two) times daily with a meal.          . psyllium (METAMUCIL) 58.6 % powder   Oral   Take 1 packet by mouth at bedtime. SUGAR FREE ORANGE FLAVORED         . VENTOLIN HFA 108 (90 BASE) MCG/ACT inhaler   Inhalation   Inhale 1 puff into the lungs every 6 (six) hours as needed for wheezing or shortness of breath.          . zolpidem (AMBIEN) 5 MG tablet   Oral   Take 5 mg by mouth at bedtime.          . cyclopentolate (CYCLOGYL) 1 % ophthalmic solution   Both Eyes   Place 1 drop into both eyes 2 (two) times daily.   5 mL   0   . prednisoLONE acetate (PRED FORTE) 1 % ophthalmic suspension   Both Eyes   Place 1 drop into both eyes 3 (three) times daily.   5 mL   0    BP 130/71  Pulse 85  Temp(Src) 98.6 F (37 C) (Oral)  Resp 18  Ht  5\' 11"  (1.803 m)  Wt 300 lb (136.079 kg)  BMI 41.86 kg/m2  SpO2 97% Physical Exam CONSTITUTIONAL: Well developed/well nourished HEAD: Normocephalic/atraumatic EYES: EOMI/PERRL. Bilateral conjunctival injection. No proptosis.  No drainage noted.  No corneal hazing.  IOP 12 in OD, IOP 16 in OS, no cell flare noted on slit lamp, no corneal abrasions noted +consensual pain and significant photophobia noted ENMT: Mucous membranes moist NECK: supple no meningeal signs CV: S1/S2 noted, no murmurs/rubs/gallops noted LUNGS: Lungs are clear to auscultation bilaterally, no apparent distress ABDOMEN: soft, obese NEURO: Pt is awake/alert, moves all extremitiesx4, pt is ambulatory EXTREMITIES:full ROM SKIN: warm, color normal PSYCH: no abnormalities of mood noted  ED Course  Procedures  EKG Interpretation   None     visual acuity is 20/15 bilaterally D/w dr Lita Mains.  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 Iritis    Nursing notes including past medical history and social history reviewed and considered in  documentation     Joya Gaskins, MD 04/24/13 1401

## 2013-05-05 DIAGNOSIS — H2510 Age-related nuclear cataract, unspecified eye: Secondary | ICD-10-CM | POA: Diagnosis not present

## 2013-05-05 DIAGNOSIS — H20019 Primary iridocyclitis, unspecified eye: Secondary | ICD-10-CM | POA: Diagnosis not present

## 2013-05-05 DIAGNOSIS — E119 Type 2 diabetes mellitus without complications: Secondary | ICD-10-CM | POA: Diagnosis not present

## 2013-05-05 DIAGNOSIS — H35379 Puckering of macula, unspecified eye: Secondary | ICD-10-CM | POA: Diagnosis not present

## 2013-05-12 DIAGNOSIS — J45909 Unspecified asthma, uncomplicated: Secondary | ICD-10-CM | POA: Diagnosis not present

## 2013-05-12 DIAGNOSIS — E669 Obesity, unspecified: Secondary | ICD-10-CM | POA: Diagnosis not present

## 2013-05-12 DIAGNOSIS — Z23 Encounter for immunization: Secondary | ICD-10-CM | POA: Diagnosis not present

## 2013-05-12 DIAGNOSIS — E119 Type 2 diabetes mellitus without complications: Secondary | ICD-10-CM | POA: Diagnosis not present

## 2013-05-12 DIAGNOSIS — J309 Allergic rhinitis, unspecified: Secondary | ICD-10-CM | POA: Diagnosis not present

## 2013-05-28 DIAGNOSIS — H20019 Primary iridocyclitis, unspecified eye: Secondary | ICD-10-CM | POA: Diagnosis not present

## 2013-07-20 DIAGNOSIS — J309 Allergic rhinitis, unspecified: Secondary | ICD-10-CM | POA: Diagnosis not present

## 2013-07-20 DIAGNOSIS — J329 Chronic sinusitis, unspecified: Secondary | ICD-10-CM | POA: Diagnosis not present

## 2013-07-20 DIAGNOSIS — J410 Simple chronic bronchitis: Secondary | ICD-10-CM | POA: Diagnosis not present

## 2013-07-30 DIAGNOSIS — J343 Hypertrophy of nasal turbinates: Secondary | ICD-10-CM | POA: Diagnosis not present

## 2013-07-30 DIAGNOSIS — J310 Chronic rhinitis: Secondary | ICD-10-CM | POA: Diagnosis not present

## 2013-09-23 DIAGNOSIS — H01009 Unspecified blepharitis unspecified eye, unspecified eyelid: Secondary | ICD-10-CM | POA: Diagnosis not present

## 2014-01-19 DIAGNOSIS — H20019 Primary iridocyclitis, unspecified eye: Secondary | ICD-10-CM | POA: Diagnosis not present

## 2014-02-04 DIAGNOSIS — H20019 Primary iridocyclitis, unspecified eye: Secondary | ICD-10-CM | POA: Diagnosis not present

## 2014-04-26 DIAGNOSIS — Z23 Encounter for immunization: Secondary | ICD-10-CM | POA: Diagnosis not present

## 2014-05-24 DIAGNOSIS — E1342 Other specified diabetes mellitus with diabetic polyneuropathy: Secondary | ICD-10-CM | POA: Diagnosis not present

## 2014-05-24 DIAGNOSIS — L609 Nail disorder, unspecified: Secondary | ICD-10-CM | POA: Diagnosis not present

## 2014-05-24 DIAGNOSIS — B351 Tinea unguium: Secondary | ICD-10-CM | POA: Diagnosis not present

## 2014-05-24 DIAGNOSIS — L853 Xerosis cutis: Secondary | ICD-10-CM | POA: Diagnosis not present

## 2014-05-24 DIAGNOSIS — L851 Acquired keratosis [keratoderma] palmaris et plantaris: Secondary | ICD-10-CM | POA: Diagnosis not present

## 2014-07-08 DIAGNOSIS — E1165 Type 2 diabetes mellitus with hyperglycemia: Secondary | ICD-10-CM | POA: Diagnosis not present

## 2014-07-08 DIAGNOSIS — E782 Mixed hyperlipidemia: Secondary | ICD-10-CM | POA: Diagnosis not present

## 2014-09-21 DIAGNOSIS — E782 Mixed hyperlipidemia: Secondary | ICD-10-CM | POA: Diagnosis not present

## 2014-09-21 DIAGNOSIS — E1165 Type 2 diabetes mellitus with hyperglycemia: Secondary | ICD-10-CM | POA: Diagnosis not present

## 2014-09-23 DIAGNOSIS — M5136 Other intervertebral disc degeneration, lumbar region: Secondary | ICD-10-CM | POA: Diagnosis not present

## 2014-09-23 DIAGNOSIS — M1611 Unilateral primary osteoarthritis, right hip: Secondary | ICD-10-CM | POA: Diagnosis not present

## 2014-09-23 DIAGNOSIS — M5431 Sciatica, right side: Secondary | ICD-10-CM | POA: Diagnosis not present

## 2014-12-13 DIAGNOSIS — E782 Mixed hyperlipidemia: Secondary | ICD-10-CM | POA: Diagnosis not present

## 2014-12-13 DIAGNOSIS — E1165 Type 2 diabetes mellitus with hyperglycemia: Secondary | ICD-10-CM | POA: Diagnosis not present

## 2014-12-13 DIAGNOSIS — K429 Umbilical hernia without obstruction or gangrene: Secondary | ICD-10-CM | POA: Diagnosis not present

## 2015-02-19 IMAGING — CR DG CHEST 2V
2 series · 2 of 2 positions shown · non-contrast
Comparison: 08/16/2008

CLINICAL DATA: Cough and congestion for 3 weeks, history
hypertension, diabetes, smoking

EXAM:
CHEST  2 VIEW

[view not recorded (1 of 2)]
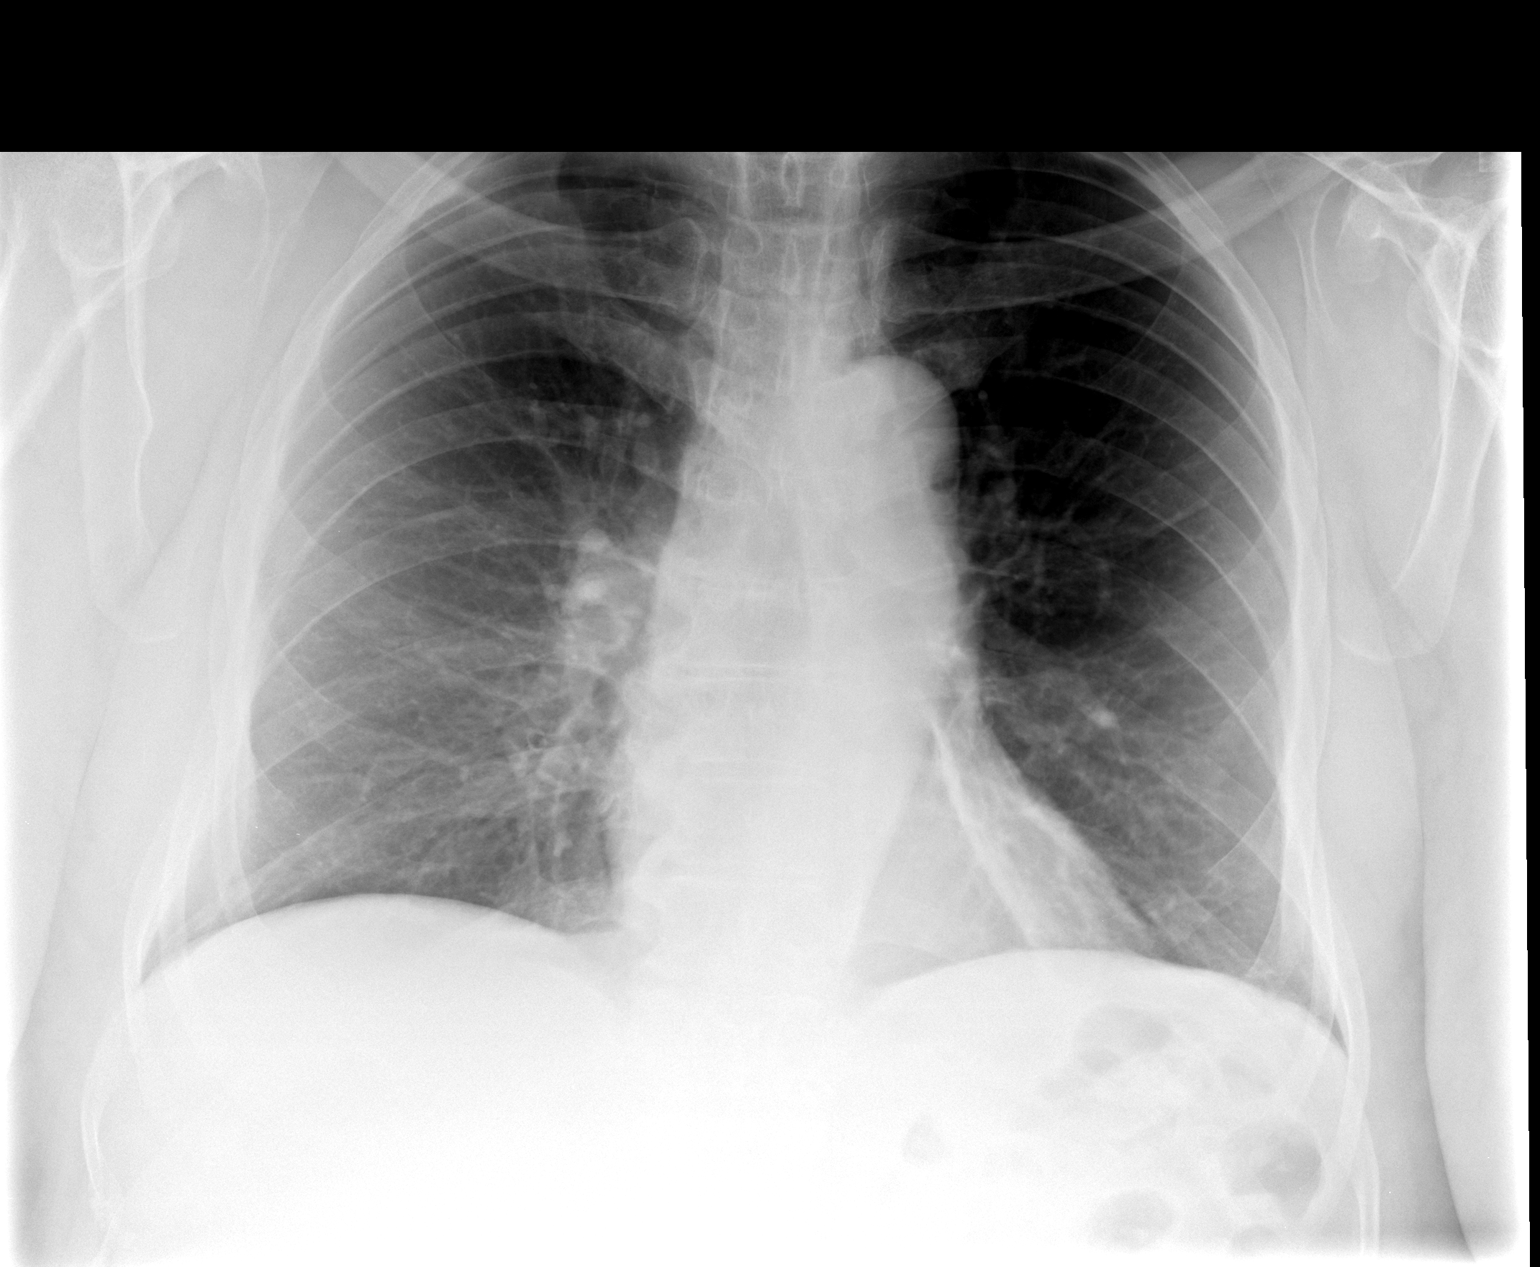

[view not recorded (2 of 2)]
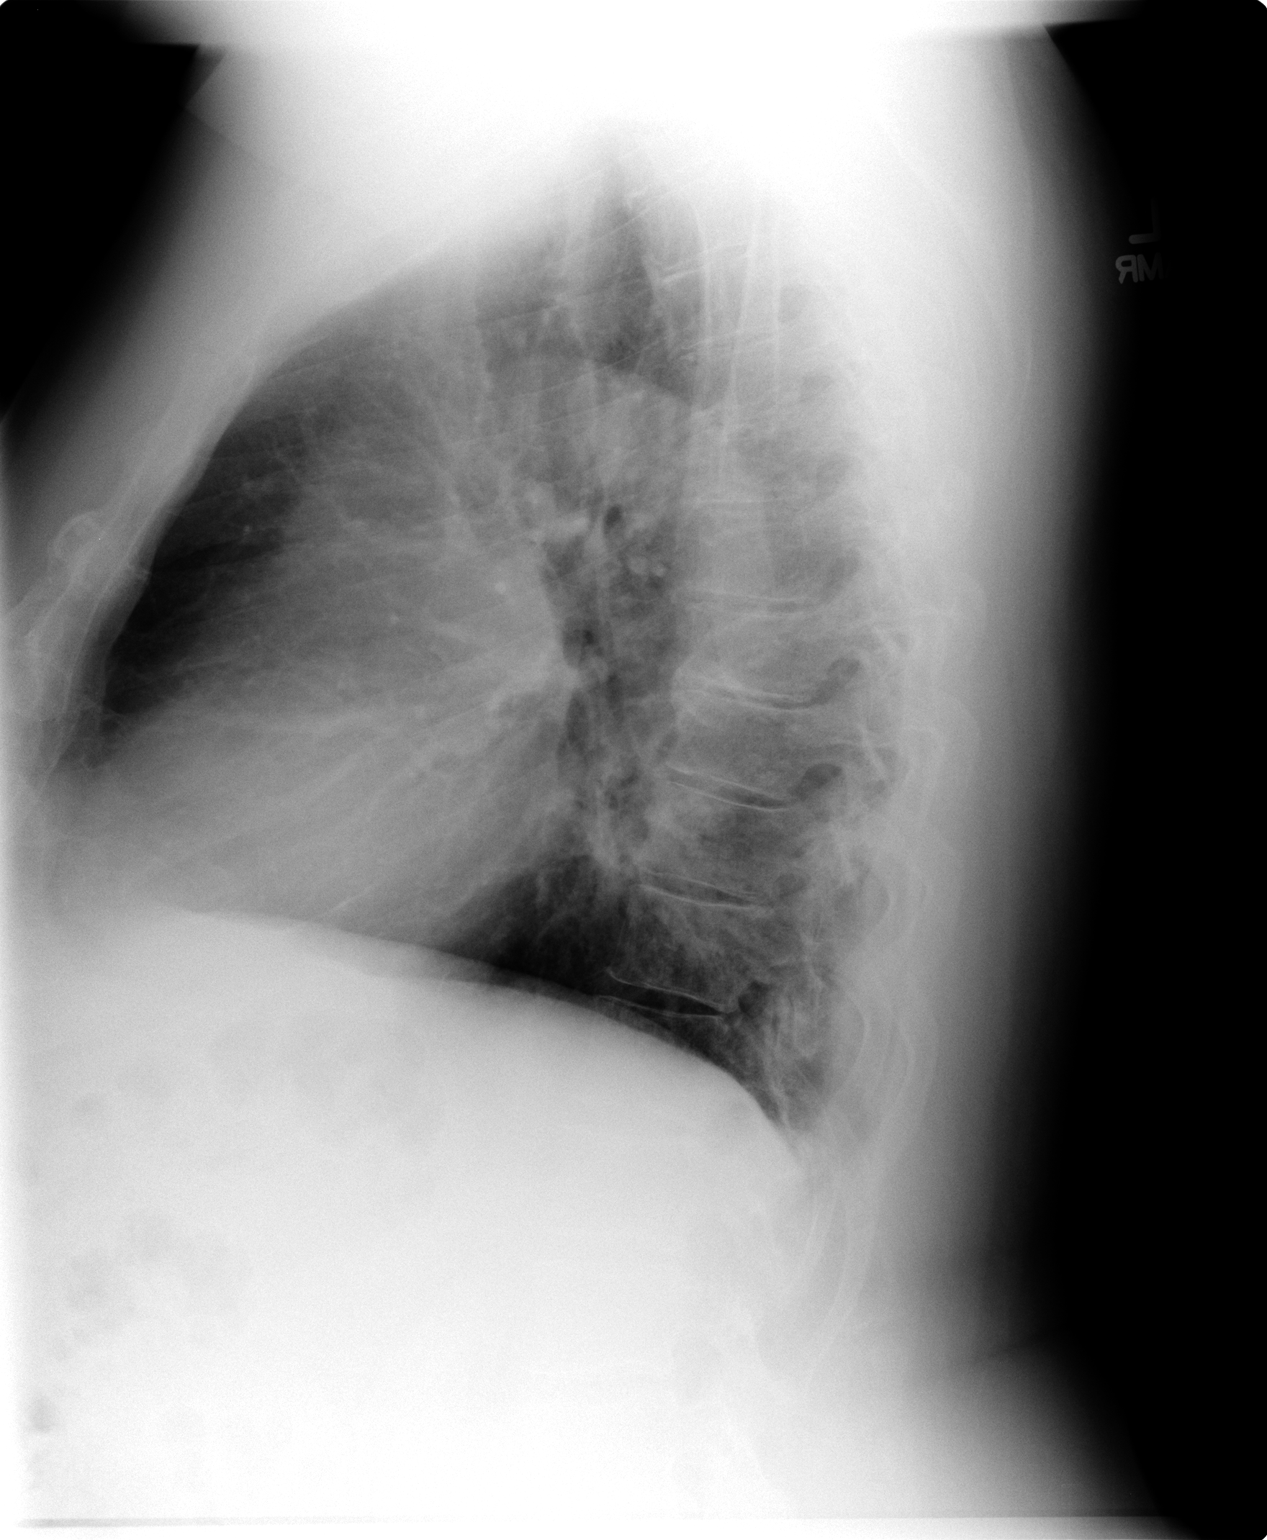

[2 of 2 positions shown; findings below may reference images not displayed]

FINDINGS: Normal heart size and pulmonary vascularity.

Tortuous aorta.

Subsegmental atelectasis left lower lobe.

Emphysematous changes without infiltrate, pleural effusion or
pneumothorax.

No acute osseous findings.
IMPRESSION: COPD changes with subsegmental atelectasis left lower lobe.

## 2015-02-24 DIAGNOSIS — K429 Umbilical hernia without obstruction or gangrene: Secondary | ICD-10-CM | POA: Diagnosis not present

## 2015-02-24 DIAGNOSIS — E782 Mixed hyperlipidemia: Secondary | ICD-10-CM | POA: Diagnosis not present

## 2015-02-24 DIAGNOSIS — E1165 Type 2 diabetes mellitus with hyperglycemia: Secondary | ICD-10-CM | POA: Diagnosis not present

## 2015-03-22 DIAGNOSIS — J40 Bronchitis, not specified as acute or chronic: Secondary | ICD-10-CM | POA: Diagnosis not present

## 2015-03-22 DIAGNOSIS — J449 Chronic obstructive pulmonary disease, unspecified: Secondary | ICD-10-CM | POA: Diagnosis not present

## 2015-03-22 DIAGNOSIS — R062 Wheezing: Secondary | ICD-10-CM | POA: Diagnosis not present

## 2015-03-22 DIAGNOSIS — E1165 Type 2 diabetes mellitus with hyperglycemia: Secondary | ICD-10-CM | POA: Diagnosis not present

## 2015-04-14 DIAGNOSIS — J40 Bronchitis, not specified as acute or chronic: Secondary | ICD-10-CM | POA: Diagnosis not present

## 2015-04-14 DIAGNOSIS — E1165 Type 2 diabetes mellitus with hyperglycemia: Secondary | ICD-10-CM | POA: Diagnosis not present

## 2015-04-26 DIAGNOSIS — E119 Type 2 diabetes mellitus without complications: Secondary | ICD-10-CM | POA: Diagnosis not present

## 2015-04-26 DIAGNOSIS — H2513 Age-related nuclear cataract, bilateral: Secondary | ICD-10-CM | POA: Diagnosis not present

## 2015-12-09 DIAGNOSIS — K429 Umbilical hernia without obstruction or gangrene: Secondary | ICD-10-CM | POA: Diagnosis not present

## 2015-12-09 DIAGNOSIS — I10 Essential (primary) hypertension: Secondary | ICD-10-CM | POA: Diagnosis not present

## 2015-12-09 DIAGNOSIS — E1165 Type 2 diabetes mellitus with hyperglycemia: Secondary | ICD-10-CM | POA: Diagnosis not present

## 2016-03-07 ENCOUNTER — Other Ambulatory Visit: Payer: Self-pay

## 2016-03-23 DIAGNOSIS — I10 Essential (primary) hypertension: Secondary | ICD-10-CM | POA: Diagnosis not present

## 2016-03-23 DIAGNOSIS — Z23 Encounter for immunization: Secondary | ICD-10-CM | POA: Diagnosis not present

## 2016-03-23 DIAGNOSIS — E782 Mixed hyperlipidemia: Secondary | ICD-10-CM | POA: Diagnosis not present

## 2016-03-23 DIAGNOSIS — E1165 Type 2 diabetes mellitus with hyperglycemia: Secondary | ICD-10-CM | POA: Diagnosis not present

## 2016-03-23 DIAGNOSIS — K429 Umbilical hernia without obstruction or gangrene: Secondary | ICD-10-CM | POA: Diagnosis not present

## 2016-03-27 DIAGNOSIS — K429 Umbilical hernia without obstruction or gangrene: Secondary | ICD-10-CM | POA: Diagnosis not present

## 2016-04-19 DIAGNOSIS — I10 Essential (primary) hypertension: Secondary | ICD-10-CM | POA: Diagnosis not present

## 2016-04-19 DIAGNOSIS — E1165 Type 2 diabetes mellitus with hyperglycemia: Secondary | ICD-10-CM | POA: Diagnosis not present

## 2016-04-19 DIAGNOSIS — K429 Umbilical hernia without obstruction or gangrene: Secondary | ICD-10-CM | POA: Diagnosis not present

## 2016-04-19 DIAGNOSIS — E782 Mixed hyperlipidemia: Secondary | ICD-10-CM | POA: Diagnosis not present

## 2016-04-19 DIAGNOSIS — E119 Type 2 diabetes mellitus without complications: Secondary | ICD-10-CM | POA: Diagnosis not present

## 2016-10-18 DIAGNOSIS — Z79899 Other long term (current) drug therapy: Secondary | ICD-10-CM | POA: Diagnosis not present

## 2016-12-06 DIAGNOSIS — E669 Obesity, unspecified: Secondary | ICD-10-CM | POA: Diagnosis not present

## 2016-12-06 DIAGNOSIS — M1990 Unspecified osteoarthritis, unspecified site: Secondary | ICD-10-CM | POA: Diagnosis not present

## 2016-12-06 DIAGNOSIS — I10 Essential (primary) hypertension: Secondary | ICD-10-CM | POA: Diagnosis not present

## 2016-12-06 DIAGNOSIS — G4700 Insomnia, unspecified: Secondary | ICD-10-CM | POA: Diagnosis not present

## 2016-12-06 DIAGNOSIS — Z87891 Personal history of nicotine dependence: Secondary | ICD-10-CM | POA: Diagnosis not present

## 2016-12-06 DIAGNOSIS — J449 Chronic obstructive pulmonary disease, unspecified: Secondary | ICD-10-CM | POA: Diagnosis not present

## 2016-12-06 DIAGNOSIS — Z7984 Long term (current) use of oral hypoglycemic drugs: Secondary | ICD-10-CM | POA: Diagnosis not present

## 2016-12-06 DIAGNOSIS — J309 Allergic rhinitis, unspecified: Secondary | ICD-10-CM | POA: Diagnosis not present

## 2016-12-06 DIAGNOSIS — Z8546 Personal history of malignant neoplasm of prostate: Secondary | ICD-10-CM | POA: Diagnosis not present

## 2016-12-06 DIAGNOSIS — Z8249 Family history of ischemic heart disease and other diseases of the circulatory system: Secondary | ICD-10-CM | POA: Diagnosis not present

## 2016-12-06 DIAGNOSIS — F54 Psychological and behavioral factors associated with disorders or diseases classified elsewhere: Secondary | ICD-10-CM | POA: Diagnosis not present

## 2016-12-06 DIAGNOSIS — Z6841 Body Mass Index (BMI) 40.0 and over, adult: Secondary | ICD-10-CM | POA: Diagnosis not present

## 2016-12-06 DIAGNOSIS — J3089 Other allergic rhinitis: Secondary | ICD-10-CM | POA: Diagnosis not present

## 2016-12-06 DIAGNOSIS — Z7189 Other specified counseling: Secondary | ICD-10-CM | POA: Diagnosis not present

## 2016-12-06 DIAGNOSIS — E119 Type 2 diabetes mellitus without complications: Secondary | ICD-10-CM | POA: Diagnosis not present

## 2016-12-06 DIAGNOSIS — Z79899 Other long term (current) drug therapy: Secondary | ICD-10-CM | POA: Diagnosis not present

## 2016-12-06 DIAGNOSIS — E785 Hyperlipidemia, unspecified: Secondary | ICD-10-CM | POA: Diagnosis not present

## 2016-12-06 DIAGNOSIS — Z880 Allergy status to penicillin: Secondary | ICD-10-CM | POA: Diagnosis not present

## 2016-12-13 DIAGNOSIS — E1165 Type 2 diabetes mellitus with hyperglycemia: Secondary | ICD-10-CM | POA: Diagnosis not present

## 2016-12-13 DIAGNOSIS — I10 Essential (primary) hypertension: Secondary | ICD-10-CM | POA: Diagnosis not present

## 2017-01-17 DIAGNOSIS — E1165 Type 2 diabetes mellitus with hyperglycemia: Secondary | ICD-10-CM | POA: Diagnosis not present

## 2017-01-17 DIAGNOSIS — K429 Umbilical hernia without obstruction or gangrene: Secondary | ICD-10-CM | POA: Diagnosis not present

## 2017-01-17 DIAGNOSIS — I10 Essential (primary) hypertension: Secondary | ICD-10-CM | POA: Diagnosis not present

## 2017-02-19 DIAGNOSIS — E1165 Type 2 diabetes mellitus with hyperglycemia: Secondary | ICD-10-CM | POA: Diagnosis not present

## 2017-03-19 DIAGNOSIS — E782 Mixed hyperlipidemia: Secondary | ICD-10-CM | POA: Diagnosis not present

## 2017-03-19 DIAGNOSIS — E1165 Type 2 diabetes mellitus with hyperglycemia: Secondary | ICD-10-CM | POA: Diagnosis not present

## 2017-03-19 DIAGNOSIS — K429 Umbilical hernia without obstruction or gangrene: Secondary | ICD-10-CM | POA: Diagnosis not present

## 2017-04-02 ENCOUNTER — Encounter: Payer: Self-pay | Admitting: Internal Medicine

## 2017-04-15 DIAGNOSIS — E119 Type 2 diabetes mellitus without complications: Secondary | ICD-10-CM | POA: Diagnosis not present

## 2017-04-15 DIAGNOSIS — H3540 Unspecified peripheral retinal degeneration: Secondary | ICD-10-CM | POA: Diagnosis not present

## 2017-04-15 DIAGNOSIS — H52223 Regular astigmatism, bilateral: Secondary | ICD-10-CM | POA: Diagnosis not present

## 2017-04-15 DIAGNOSIS — H5203 Hypermetropia, bilateral: Secondary | ICD-10-CM | POA: Diagnosis not present

## 2017-04-15 DIAGNOSIS — H2513 Age-related nuclear cataract, bilateral: Secondary | ICD-10-CM | POA: Diagnosis not present

## 2017-04-15 DIAGNOSIS — H35413 Lattice degeneration of retina, bilateral: Secondary | ICD-10-CM | POA: Diagnosis not present

## 2017-04-15 DIAGNOSIS — H524 Presbyopia: Secondary | ICD-10-CM | POA: Diagnosis not present

## 2017-04-18 DIAGNOSIS — Z1389 Encounter for screening for other disorder: Secondary | ICD-10-CM | POA: Diagnosis not present

## 2017-04-18 DIAGNOSIS — I10 Essential (primary) hypertension: Secondary | ICD-10-CM | POA: Diagnosis not present

## 2017-04-18 DIAGNOSIS — Z23 Encounter for immunization: Secondary | ICD-10-CM | POA: Diagnosis not present

## 2017-04-18 DIAGNOSIS — E1165 Type 2 diabetes mellitus with hyperglycemia: Secondary | ICD-10-CM | POA: Diagnosis not present

## 2017-04-18 DIAGNOSIS — K429 Umbilical hernia without obstruction or gangrene: Secondary | ICD-10-CM | POA: Diagnosis not present

## 2017-05-29 ENCOUNTER — Ambulatory Visit (INDEPENDENT_AMBULATORY_CARE_PROVIDER_SITE_OTHER): Payer: Medicare Other | Admitting: Gastroenterology

## 2017-05-29 ENCOUNTER — Other Ambulatory Visit: Payer: Self-pay | Admitting: *Deleted

## 2017-05-29 ENCOUNTER — Encounter: Payer: Self-pay | Admitting: Gastroenterology

## 2017-05-29 ENCOUNTER — Encounter: Payer: Self-pay | Admitting: *Deleted

## 2017-05-29 DIAGNOSIS — Z860101 Personal history of adenomatous and serrated colon polyps: Secondary | ICD-10-CM | POA: Insufficient documentation

## 2017-05-29 DIAGNOSIS — Z86010 Personal history of colonic polyps: Secondary | ICD-10-CM

## 2017-05-29 MED ORDER — NA SULFATE-K SULFATE-MG SULF 17.5-3.13-1.6 GM/177ML PO SOLN: 1.0000 | ORAL | 0 refills | Status: DC

## 2017-05-29 NOTE — Progress Notes (Signed)
cc'ed to pcp °

## 2017-05-29 NOTE — Patient Instructions (Signed)
We have scheduled you for a colonoscopy with Dr. Gala Romney in the near future.  Do not take metformin the day of the procedure, as you will not be eating anything that morning.

## 2017-05-29 NOTE — Assessment & Plan Note (Signed)
71 year old male with remote history of adenomas at an outside facility in 2000, last colonoscopy in 2008 with hyperplastic polyp, and presenting today for surveillance. As of note, he has a baseball sized umbilical hernia, but this is not causing him any issues. Evaluation for bariatric surgery underway with [facility]. He has no concerning lower or upper GI symptoms.   Proceed with TCS with Dr. Gala Romney in near future: the risks, benefits, and alternatives have been discussed with the patient in detail. The patient states understanding and desires to proceed. Due to co-morbidities, feel it would be best that he undergoes this with Propofol for monitored anesthesia care.  Hold metformin the day of procedure

## 2017-05-29 NOTE — Progress Notes (Signed)
Primary Care Physician:  Rosita Fire, MD Primary Gastroenterologist:  Dr. Gala Romney   Chief Complaint  Patient presents with  . Colonoscopy    consult    HPI:   Travis Blanchard is a 71 y.o. male presenting today at the request of Dr. Legrand Rams with a reason stating "abdominal pain". However, he denies any abdominal pain. Upon review of records, his last colonoscopy was in 2008 with hyperplastic polyps. Prior to this in the year 2000, there is notation that he had adenomas at an outside facility.   He has a large base-ball sized umbilical hernia, easily reducible, and he is followed at [facility] and undergoing pre-operative evaluations for bariatric surgery. He will need to lose weight before repair.   He has no abdominal pain, rectal bleeding, constipation, diarrhea, or upper GI symptoms.   Past Medical History:  Diagnosis Date  . Arthritis   . Cancer [facility])    Prostate  . COPD (chronic obstructive pulmonary disease) ([place])   . Diabetes mellitus without complication ([place])   . Hyperlipidemia   . Hypertension     Past Surgical History:  Procedure Laterality Date  . PROSTATE SURGERY  2000   Prostate removal/Cancer    Current Outpatient Medications  Medication Sig Dispense Refill  . atorvastatin (LIPITOR) 20 MG tablet Take 20 mg by mouth at bedtime.     [name] lisinopril (PRINIVIL,ZESTRIL) 5 MG tablet Take 5 mg by mouth every morning.     . metFORMIN (GLUCOPHAGE) 500 MG tablet Take 500 mg by mouth 2 (two) times daily with a meal.     . psyllium (METAMUCIL) 58.6 % powder Take 1 packet by mouth at bedtime. SUGAR FREE ORANGE FLAVORED    . VENTOLIN HFA 108 (90 BASE) MCG/ACT inhaler Inhale 1 puff into the lungs every 6 (six) hours as needed for wheezing or shortness of breath.     . zolpidem (AMBIEN) 5 MG tablet Take 5 mg by mouth at bedtime.      No current facility-administered medications for this visit.     Allergies as of 05/29/2017 - Review Complete 05/29/2017  Allergen  Reaction Noted  . Penicillins Hives and Itching 03/11/2013    Family History  Problem Relation Age of Onset  . Diabetes Mother   . Hypertension Mother   . Cancer Sister        Breast  . Colon cancer Neg Hx     Social History   Socioeconomic History  . Marital status: Married    Spouse name: Not on file  . Number of children: Not on file  . Years of education: Not on file  . Highest education level: Not on file  Social Needs  . Financial resource strain: Not on file  . Food insecurity - worry: Not on file  . Food insecurity - inability: Not on file  . Transportation needs - medical: Not on file  . Transportation needs - non-medical: Not on file  Occupational History  . Not on file  Tobacco Use  . Smoking status: Former Smoker    Last attempt to quit: 03/11/2002    Years since quitting: 15.2  . Smokeless tobacco: Never Used  Substance and Sexual Activity  . Alcohol use: No    Frequency: Never  . Drug use: No  . Sexual activity: Not on file  Other Topics Concern  . Not on file  Social History Narrative  . Not on file    Review of Systems:  Gen: Denies any fever, chills, fatigue, weight loss, lack of appetite.  CV: Denies chest pain, heart palpitations, peripheral edema, syncope.  Resp: Denies shortness of breath at rest or with exertion. Denies wheezing or cough.  GI: see HPI  GU : Denies urinary burning, urinary frequency, urinary hesitancy MS: Denies joint pain, muscle weakness, cramps, or limitation of movement.  Derm: Denies rash, itching, dry skin Psych: Denies depression, anxiety, memory loss, and confusion Heme: Denies bruising, bleeding, and enlarged lymph nodes.  Physical Exam: BP 135/82   Pulse 75   Temp 98 F (36.7 C) (Oral)   Ht 5\' 10"  (1.778 m)   Wt 294 lb 9.6 oz (133.6 kg)   BMI 42.27 kg/m  General:   Alert and oriented. Pleasant and cooperative. Well-nourished and well-developed.  Head:  Normocephalic and atraumatic. Eyes:  Without icterus,  sclera clear and conjunctiva pink.  Ears:  Normal auditory acuity. Nose:  No deformity, discharge,  or lesions. Mouth:  No deformity or lesions, oral mucosa pink.  Lungs:  Clear to auscultation bilaterally. No wheezes, rales, or rhonchi. No distress.  Heart:  S1, S2 present without murmurs appreciated.  Abdomen:  +BS, soft, non-tender and non-distended. No HSM noted. Baseball sized umbilical hernia that easily reduces when laying flat Rectal:  Deferred  Msk:  Symmetrical without gross deformities. Normal posture. Extremities:  Without edema. Neurologic:  Alert and  oriented x4 Psych:  Alert and cooperative. Normal mood and affect.

## 2017-06-03 ENCOUNTER — Encounter: Payer: Self-pay | Admitting: *Deleted

## 2017-06-03 ENCOUNTER — Telehealth: Payer: Self-pay | Admitting: *Deleted

## 2017-06-03 NOTE — Telephone Encounter (Signed)
Spoke with pt and is aware preop is scheduled for 07/12/17 @ 9:00am. Letter also mailed to patient

## 2017-07-08 NOTE — Patient Instructions (Signed)
Travis Blanchard  07/08/2017     @PREFPERIOPPHARMACY @   Your procedure is scheduled on  07/18/2017 .  Report to [facility] at  645   A.M.  Call this number if you have problems the morning of surgery:  [phone]   Remember:  Do not eat food or drink liquids after midnight.  Take these medicines the morning of surgery with A SIP OF WATER  Lisinopril, claritin. Use your inhaler before you come and bring your rescue inhaler if you have one.   Do not wear jewelry, make-up or nail polish.  Do not wear lotions, powders, or perfumes, or deodorant.  Do not shave 48 hours prior to surgery.  Men may shave face and neck.  Do not bring valuables to the hospital.  [facility] is not responsible for any belongings or valuables.  Contacts, dentures or bridgework may not be worn into surgery.  Leave your suitcase in the car.  After surgery it may be brought to your room.  For patients admitted to the hospital, discharge time will be determined by your treatment team.  Patients discharged the day of surgery will not be allowed to drive home.   Name and phone number of your driver:   family Special instructions:  Follow the diet and prep instructions given to you by Dr Roseanne Kaufman office.  Please read over the following fact sheets that you were given. Anesthesia Post-op Instructions and Care and Recovery After Surgery       Colonoscopy, Adult A colonoscopy is an exam to look at the large intestine. It is done to check for problems, such as:  Lumps (tumors).  Growths (polyps).  Swelling (inflammation).  Bleeding.  What happens before the procedure? Eating and drinking Follow instructions from your doctor about eating and drinking. These instructions may include:  A few days before the procedure - follow a low-fiber diet. ? Avoid nuts. ? Avoid seeds. ? Avoid dried fruit. ? Avoid raw fruits. ? Avoid vegetables.  1-3 days before the procedure - follow a clear  liquid diet. Avoid liquids that have red or purple dye. Drink only clear liquids, such as: ? Clear broth or bouillon. ? Black coffee or tea. ? Clear juice. ? Clear soft drinks or sports drinks. ? Gelatin dessert. ? Popsicles.  On the day of the procedure - do not eat or drink anything during the 2 hours before the procedure.  Bowel prep If you were prescribed an oral bowel prep:  Take it as told by your doctor. Starting the day before your procedure, you will need to drink a lot of liquid. The liquid will cause you to poop (have bowel movements) until your poop is almost clear or light green.  If your skin or butt gets irritated from diarrhea, you may: ? Wipe the area with wipes that have medicine in them, such as adult wet wipes with aloe and vitamin E. ? Put something on your skin that soothes the area, such as petroleum jelly.  If you throw up (vomit) while drinking the bowel prep, take a break for up to 60 minutes. Then begin the bowel prep again. If you keep throwing up and you cannot take the bowel prep without throwing up, call your doctor.  General instructions  Ask your doctor about changing or stopping your normal medicines. This is important if you take diabetes medicines or blood thinners.  Plan to have someone take you home  from the hospital or clinic. What happens during the procedure?  An IV tube may be put into one of your veins.  You will be given medicine to help you relax (sedative).  To reduce your risk of infection: ? Your doctors will wash their hands. ? Your anal area will be washed with soap.  You will be asked to lie on your side with your knees bent.  Your doctor will get a long, thin, flexible tube ready. The tube will have a camera and a light on the end.  The tube will be put into your anus.  The tube will be gently put into your large intestine.  Air will be delivered into your large intestine to keep it open. You may feel some pressure or  cramping.  The camera will be used to take photos.  A small tissue sample may be removed from your body to be looked at under a microscope (biopsy). If any possible problems are found, the tissue will be sent to a lab for testing.  If small growths are found, your doctor may remove them and have them checked for cancer.  The tube that was put into your anus will be slowly removed. The procedure may vary among doctors and hospitals. What happens after the procedure?  Your doctor will check on you often until the medicines you were given have worn off.  Do not drive for 24 hours after the procedure.  You may have a small amount of blood in your poop.  You may pass gas.  You may have mild cramps or bloating in your belly (abdomen).  It is up to you to get the results of your procedure. Ask your doctor, or the department performing the procedure, when your results will be ready. This information is not intended to replace advice given to you by your health care provider. Make sure you discuss any questions you have with your health care provider. Document Released: 07/28/2010 Document Revised: 04/25/2016 Document Reviewed: 09/06/2015 Elsevier Interactive Patient Education  2017 [employer]  Colonoscopy, Adult, Care After This sheet gives you information about how to care for yourself after your procedure. Your doctor may also give you more specific instructions. If you have problems or questions, call your doctor. Follow these instructions at home: General instructions   For the first 24 hours after the procedure: ? Do not drive or use machinery. ? Do not sign important documents. ? Do not drink alcohol. ? Do your daily activities more slowly than normal. ? Eat foods that are soft and easy to digest. ? Rest often.  Take over-the-counter or prescription medicines only as told by your doctor.  It is up to you to get the results of your procedure. Ask your doctor, or the  department performing the procedure, when your results will be ready. To help cramping and bloating:  Try walking around.  Put heat on your belly (abdomen) as told by your doctor. Use a heat source that your doctor recommends, such as a moist heat pack or a heating pad. ? Put a towel between your skin and the heat source. ? Leave the heat on for 20-30 minutes. ? Remove the heat if your skin turns bright red. This is especially important if you cannot feel pain, heat, or cold. You can get burned. Eating and drinking  Drink enough fluid to keep your pee (urine) clear or pale yellow.  Return to your normal diet as told by your doctor. Avoid heavy or  fried foods that are hard to digest.  Avoid drinking alcohol for as long as told by your doctor. Contact a doctor if:  You have blood in your poop (stool) 2-3 days after the procedure. Get help right away if:  You have more than a small amount of blood in your poop.  You see large clumps of tissue (blood clots) in your poop.  Your belly is swollen.  You feel sick to your stomach (nauseous).  You throw up (vomit).  You have a fever.  You have belly pain that gets worse, and medicine does not help your pain. This information is not intended to replace advice given to you by your health care provider. Make sure you discuss any questions you have with your health care provider. Document Released: 07/28/2010 Document Revised: 03/19/2016 Document Reviewed: 03/19/2016 Elsevier Interactive Patient Education  2017 [place] Anesthesia is a term that refers to techniques, procedures, and medicines that help a person stay safe and comfortable during a medical procedure. Monitored anesthesia care, or sedation, is one type of anesthesia. Your anesthesia specialist may recommend sedation if you will be having a procedure that does not require you to be unconscious, such as:  Cataract surgery.  A dental  procedure.  A biopsy.  A colonoscopy.  During the procedure, you may receive a medicine to help you relax (sedative). There are three levels of sedation:  Mild sedation. At this level, you may feel awake and relaxed. You will be able to follow directions.  Moderate sedation. At this level, you will be sleepy. You may not remember the procedure.  Deep sedation. At this level, you will be asleep. You will not remember the procedure.  The more medicine you are given, the deeper your level of sedation will be. Depending on how you respond to the procedure, the anesthesia specialist may change your level of sedation or the type of anesthesia to fit your needs. An anesthesia specialist will monitor you closely during the procedure. Let your health care provider know about:  Any allergies you have.  All medicines you are taking, including vitamins, herbs, eye drops, creams, and over-the-counter medicines.  Any use of steroids (by mouth or as a cream).  Any problems you or family members have had with sedatives and anesthetic medicines.  Any blood disorders you have.  Any surgeries you have had.  Any medical conditions you have, such as sleep apnea.  Whether you are pregnant or may be pregnant.  Any use of cigarettes, alcohol, or street drugs. What are the risks? Generally, this is a safe procedure. However, problems may occur, including:  Getting too much medicine (oversedation).  Nausea.  Allergic reaction to medicines.  Trouble breathing. If this happens, a breathing tube may be used to help with breathing. It will be removed when you are awake and breathing on your own.  Heart trouble.  Lung trouble.  Before the procedure Staying hydrated Follow instructions from your health care provider about hydration, which may include:  Up to 2 hours before the procedure - you may continue to drink clear liquids, such as water, clear fruit juice, black coffee, and plain  tea.  Eating and drinking restrictions Follow instructions from your health care provider about eating and drinking, which may include:  8 hours before the procedure - stop eating heavy meals or foods such as meat, fried foods, or fatty foods.  6 hours before the procedure - stop eating light meals or foods,  such as toast or cereal.  6 hours before the procedure - stop drinking milk or drinks that contain milk.  2 hours before the procedure - stop drinking clear liquids.  Medicines Ask your health care provider about:  Changing or stopping your regular medicines. This is especially important if you are taking diabetes medicines or blood thinners.  Taking medicines such as aspirin and ibuprofen. These medicines can thin your blood. Do not take these medicines before your procedure if your health care provider instructs you not to.  Tests and exams  You will have a physical exam.  You may have blood tests done to show: ? How well your kidneys and liver are working. ? How well your blood can clot.  General instructions  Plan to have someone take you home from the hospital or clinic.  If you will be going home right after the procedure, plan to have someone with you for 24 hours.  What happens during the procedure?  Your blood pressure, heart rate, breathing, level of pain and overall condition will be monitored.  An IV tube will be inserted into one of your veins.  Your anesthesia specialist will give you medicines as needed to keep you comfortable during the procedure. This may mean changing the level of sedation.  The procedure will be performed. After the procedure  Your blood pressure, heart rate, breathing rate, and blood oxygen level will be monitored until the medicines you were given have worn off.  Do not drive for 24 hours if you received a sedative.  You may: ? Feel sleepy, clumsy, or nauseous. ? Feel forgetful about what happened after the  procedure. ? Have a sore throat if you had a breathing tube during the procedure. ? Vomit. This information is not intended to replace advice given to you by your health care provider. Make sure you discuss any questions you have with your health care provider. Document Released: 03/21/2005 Document Revised: 12/02/2015 Document Reviewed: 10/16/2015 Elsevier Interactive Patient Education  2018 [place], Care After These instructions provide you with information about caring for yourself after your procedure. Your health care provider may also give you more specific instructions. Your treatment has been planned according to current medical practices, but problems sometimes occur. Call your health care provider if you have any problems or questions after your procedure. What can I expect after the procedure? After your procedure, it is common to:  Feel sleepy for several hours.  Feel clumsy and have poor balance for several hours.  Feel forgetful about what happened after the procedure.  Have poor judgment for several hours.  Feel nauseous or vomit.  Have a sore throat if you had a breathing tube during the procedure.  Follow these instructions at home: For at least 24 hours after the procedure:   Do not: ? Participate in activities in which you could fall or become injured. ? Drive. ? Use heavy machinery. ? Drink alcohol. ? Take sleeping pills or medicines that cause drowsiness. ? Make important decisions or sign legal documents. ? Take care of children on your own.  Rest. Eating and drinking  Follow the diet that is recommended by your health care provider.  If you vomit, drink water, juice, or soup when you can drink without vomiting.  Make sure you have little or no nausea before eating solid foods. General instructions  Have a responsible adult stay with you until you are awake and alert.  Take over-the-counter and prescription  medicines only as told by your health care provider.  If you smoke, do not smoke without supervision.  Keep all follow-up visits as told by your health care provider. This is important. Contact a health care provider if:  You keep feeling nauseous or you keep vomiting.  You feel light-headed.  You develop a rash.  You have a fever. Get help right away if:  You have trouble breathing. This information is not intended to replace advice given to you by your health care provider. Make sure you discuss any questions you have with your health care provider. Document Released: 10/16/2015 Document Revised: 02/15/2016 Document Reviewed: 10/16/2015 Elsevier Interactive Patient Education  [employer].

## 2017-07-12 ENCOUNTER — Other Ambulatory Visit: Payer: Self-pay

## 2017-07-12 ENCOUNTER — Encounter (HOSPITAL_COMMUNITY)
Admission: RE | Admit: 2017-07-12 | Discharge: 2017-07-12 | Disposition: A | Discharge status: Home / Self Care | Payer: Medicare Other | Source: Ambulatory Visit | Attending: Internal Medicine | Admitting: Internal Medicine

## 2017-07-12 ENCOUNTER — Encounter (HOSPITAL_COMMUNITY): Payer: Self-pay

## 2017-07-12 DIAGNOSIS — J449 Chronic obstructive pulmonary disease, unspecified: Secondary | ICD-10-CM | POA: Diagnosis not present

## 2017-07-12 DIAGNOSIS — Z01810 Encounter for preprocedural cardiovascular examination: Secondary | ICD-10-CM | POA: Insufficient documentation

## 2017-07-12 DIAGNOSIS — Z87891 Personal history of nicotine dependence: Secondary | ICD-10-CM | POA: Insufficient documentation

## 2017-07-12 DIAGNOSIS — Z01812 Encounter for preprocedural laboratory examination: Secondary | ICD-10-CM | POA: Insufficient documentation

## 2017-07-12 DIAGNOSIS — Z7984 Long term (current) use of oral hypoglycemic drugs: Secondary | ICD-10-CM | POA: Insufficient documentation

## 2017-07-12 DIAGNOSIS — I10 Essential (primary) hypertension: Secondary | ICD-10-CM | POA: Insufficient documentation

## 2017-07-12 DIAGNOSIS — E119 Type 2 diabetes mellitus without complications: Secondary | ICD-10-CM | POA: Insufficient documentation

## 2017-07-12 LAB — CBC WITH DIFFERENTIAL/PLATELET
Basophils Absolute: 0 10*3/uL (ref 0.0–0.1)
Basophils Relative: 0 %
EOS PCT: 1 %
Eosinophils Absolute: 0.1 10*3/uL (ref 0.0–0.7)
HCT: 45.1 % (ref 39.0–52.0)
Hemoglobin: 14.3 g/dL (ref 13.0–17.0)
LYMPHS PCT: 37 %
Lymphs Abs: 2.8 10*3/uL (ref 0.7–4.0)
MCH: 28.3 pg (ref 26.0–34.0)
MCHC: 31.7 g/dL (ref 30.0–36.0)
MCV: 89.3 fL (ref 78.0–100.0)
Monocytes Absolute: 0.3 10*3/uL (ref 0.1–1.0)
Monocytes Relative: 4 %
Neutro Abs: 4.5 10*3/uL (ref 1.7–7.7)
Neutrophils Relative %: 58 %
PLATELETS: 213 10*3/uL (ref 150–400)
RBC: 5.05 MIL/uL (ref 4.22–5.81)
RDW: 12.9 % (ref 11.5–15.5)
WBC: 7.6 10*3/uL (ref 4.0–10.5)

## 2017-07-12 LAB — BASIC METABOLIC PANEL
Anion gap: 11 (ref 5–15)
BUN: 11 mg/dL (ref 6–20)
CHLORIDE: 106 mmol/L (ref 101–111)
CO2: 21 mmol/L — ABNORMAL LOW (ref 22–32)
Calcium: 8.7 mg/dL — ABNORMAL LOW (ref 8.9–10.3)
Creatinine, Ser: 0.83 mg/dL (ref 0.61–1.24)
GFR calc Af Amer: >60 mL/min (ref >60)
Glucose, Bld: 158 mg/dL — ABNORMAL HIGH (ref 65–99)
POTASSIUM: 3.7 mmol/L (ref 3.5–5.1)
Sodium: 138 mmol/L (ref 135–145)

## 2017-07-18 ENCOUNTER — Encounter (HOSPITAL_COMMUNITY): Payer: Self-pay | Admitting: *Deleted

## 2017-07-18 ENCOUNTER — Other Ambulatory Visit: Payer: Self-pay

## 2017-07-18 ENCOUNTER — Ambulatory Visit (HOSPITAL_COMMUNITY)
Admission: RE | Admit: 2017-07-18 | Discharge: 2017-07-18 | Disposition: A | Discharge status: Home / Self Care | Payer: Medicare Other | Source: Ambulatory Visit | Attending: Internal Medicine | Admitting: Internal Medicine

## 2017-07-18 ENCOUNTER — Ambulatory Visit (HOSPITAL_COMMUNITY): Payer: Medicare Other | Admitting: Anesthesiology

## 2017-07-18 ENCOUNTER — Encounter (HOSPITAL_COMMUNITY)
Admission: RE | Disposition: A | Discharge status: Home / Self Care | Payer: Self-pay | Source: Ambulatory Visit | Attending: Internal Medicine

## 2017-07-18 ENCOUNTER — Telehealth: Payer: Self-pay | Admitting: General Practice

## 2017-07-18 DIAGNOSIS — Z7984 Long term (current) use of oral hypoglycemic drugs: Secondary | ICD-10-CM | POA: Insufficient documentation

## 2017-07-18 DIAGNOSIS — Z86010 Personal history of colonic polyps: Secondary | ICD-10-CM | POA: Insufficient documentation

## 2017-07-18 DIAGNOSIS — Z87891 Personal history of nicotine dependence: Secondary | ICD-10-CM | POA: Insufficient documentation

## 2017-07-18 DIAGNOSIS — Z880 Allergy status to penicillin: Secondary | ICD-10-CM | POA: Insufficient documentation

## 2017-07-18 DIAGNOSIS — J449 Chronic obstructive pulmonary disease, unspecified: Secondary | ICD-10-CM | POA: Diagnosis not present

## 2017-07-18 DIAGNOSIS — Z538 Procedure and treatment not carried out for other reasons: Secondary | ICD-10-CM | POA: Diagnosis not present

## 2017-07-18 DIAGNOSIS — M1990 Unspecified osteoarthritis, unspecified site: Secondary | ICD-10-CM | POA: Insufficient documentation

## 2017-07-18 DIAGNOSIS — I10 Essential (primary) hypertension: Secondary | ICD-10-CM | POA: Insufficient documentation

## 2017-07-18 DIAGNOSIS — Z79899 Other long term (current) drug therapy: Secondary | ICD-10-CM | POA: Insufficient documentation

## 2017-07-18 DIAGNOSIS — Z1211 Encounter for screening for malignant neoplasm of colon: Secondary | ICD-10-CM | POA: Diagnosis not present

## 2017-07-18 DIAGNOSIS — E785 Hyperlipidemia, unspecified: Secondary | ICD-10-CM | POA: Diagnosis not present

## 2017-07-18 DIAGNOSIS — K5730 Diverticulosis of large intestine without perforation or abscess without bleeding: Secondary | ICD-10-CM | POA: Diagnosis not present

## 2017-07-18 DIAGNOSIS — K439 Ventral hernia without obstruction or gangrene: Secondary | ICD-10-CM | POA: Diagnosis not present

## 2017-07-18 DIAGNOSIS — Z8042 Family history of malignant neoplasm of prostate: Secondary | ICD-10-CM | POA: Insufficient documentation

## 2017-07-18 DIAGNOSIS — E119 Type 2 diabetes mellitus without complications: Secondary | ICD-10-CM | POA: Insufficient documentation

## 2017-07-18 HISTORY — PX: COLONOSCOPY WITH PROPOFOL: SHX5780

## 2017-07-18 LAB — GLUCOSE, CAPILLARY
GLUCOSE-CAPILLARY: 79 mg/dL (ref 65–99)
Glucose-Capillary: 108 mg/dL — ABNORMAL HIGH (ref 65–99)

## 2017-07-18 SURGERY — COLONOSCOPY WITH PROPOFOL
Anesthesia: Monitor Anesthesia Care

## 2017-07-18 MED ORDER — LACTATED RINGERS IV SOLN
INTRAVENOUS | Status: DC
  Administered 2017-07-18: 08:00:00 via INTRAVENOUS

## 2017-07-18 MED ORDER — PROPOFOL 10 MG/ML IV BOLUS
INTRAVENOUS | Status: AC
  Filled 2017-07-18: qty 60

## 2017-07-18 MED ORDER — CHLORHEXIDINE GLUCONATE CLOTH 2 % EX PADS: 6.0000 | MEDICATED_PAD | Freq: Once | CUTANEOUS | Status: DC

## 2017-07-18 MED ORDER — DEXTROSE 50 % IV SOLN
12.5000 g | Freq: Once | INTRAVENOUS | Status: AC
  Administered 2017-07-18: 12.5 g via INTRAVENOUS

## 2017-07-18 MED ORDER — PROPOFOL 500 MG/50ML IV EMUL
INTRAVENOUS | Status: DC | PRN
  Administered 2017-07-18 (×2): 100 ug/kg/min via INTRAVENOUS

## 2017-07-18 MED ORDER — DEXTROSE 50 % IV SOLN
INTRAVENOUS | Status: AC
  Filled 2017-07-18: qty 50

## 2017-07-18 MED ORDER — FENTANYL CITRATE (PF) 100 MCG/2ML IJ SOLN
INTRAMUSCULAR | Status: AC
  Filled 2017-07-18: qty 2

## 2017-07-18 MED ORDER — MIDAZOLAM HCL 2 MG/2ML IJ SOLN
INTRAMUSCULAR | Status: AC
  Filled 2017-07-18: qty 2

## 2017-07-18 MED ORDER — FENTANYL CITRATE (PF) 100 MCG/2ML IJ SOLN
25.0000 ug | Freq: Once | INTRAMUSCULAR | Status: AC
  Administered 2017-07-18: 25 ug via INTRAVENOUS

## 2017-07-18 MED ORDER — MIDAZOLAM HCL 2 MG/2ML IJ SOLN
1.0000 mg | INTRAMUSCULAR | Status: AC
  Administered 2017-07-18: 2 mg via INTRAVENOUS

## 2017-07-18 MED ORDER — PROPOFOL 10 MG/ML IV BOLUS
INTRAVENOUS | Status: AC
  Filled 2017-07-18: qty 40

## 2017-07-18 MED ORDER — PROPOFOL 10 MG/ML IV BOLUS
INTRAVENOUS | Status: DC | PRN
  Administered 2017-07-18 (×2): 20 mg via INTRAVENOUS

## 2017-07-18 NOTE — Anesthesia Procedure Notes (Signed)
Procedure Name: [place] Performed by: Vista Deck, CRNA Pre-anesthesia Checklist: Patient identified, Emergency Drugs available, Suction available, Timeout performed and Patient being monitored Patient Re-evaluated:Patient Re-evaluated prior to induction Oxygen Delivery Method: Non-rebreather mask

## 2017-07-18 NOTE — Transfer of Care (Signed)
Immediate Anesthesia Transfer of Care Note  Patient: [name]  Procedure(s) Performed: COLONOSCOPY WITH PROPOFOL (N/A )  Patient Location: PACU  Anesthesia Type:MAC  Level of Consciousness: awake and patient cooperative  Airway & Oxygen Therapy: Patient Spontanous Breathing and Patient connected to face mask oxygen  Post-op Assessment: Report given to RN, Post -op Vital signs reviewed and stable and Patient moving all extremities  Post vital signs: Reviewed and stable  Last Vitals: There were no vitals filed for this visit.  Last Pain:  Vitals:   07/18/17 0714  TempSrc: Oral      Patients Stated Pain Goal: 9 (56/38/75 6433)  Complications: No apparent anesthesia complications

## 2017-07-18 NOTE — Op Note (Signed)
[facility] Patient Name: Travis Blanchard Procedure Date: 07/18/2017 8:00 AM MRN: [id number] Date of Birth: 05/22/1946 Attending MD: Norvel Richards , MD CSN: [id number] Age: 72 Admit Type: Outpatient Procedure:                Colonoscopy Indications:              High risk colon cancer surveillance: Personal                            history of colonic polyps Providers:                Norvel Richards, MD, Lurline Del, RN, Aram Candela Referring MD:              Medicines:                Propofol per Anesthesia Complications:            No immediate complications. Estimated Blood Loss:     Estimated blood loss: none. Procedure:                Pre-Anesthesia Assessment:                           - Prior to the procedure, a History and Physical                            was performed, and patient medications and                            allergies were reviewed. The patient's tolerance of                            previous anesthesia was also reviewed. The risks                            and benefits of the procedure and the sedation                            options and risks were discussed with the patient.                            All questions were answered, and informed consent                            was obtained. Prior Anticoagulants: The patient has                            taken no previous anticoagulant or antiplatelet                            agents. ASA Grade Assessment: III - A patient with  severe systemic disease. After reviewing the risks                            and benefits, the patient was deemed in                            satisfactory condition to undergo the procedure.                           After obtaining informed consent, the colonoscope                            was passed under direct vision. Throughout the                            procedure, the patient's blood pressure,  pulse, and                            oxygen saturations were monitored continuously. The                            EC38-[id number]) scope was introduced through                            the and advanced to the the descending colon. The                            colonoscopy was performed with moderate difficulty                            due to the patient's body habitus. The patient                            tolerated the procedure well. The quality of the                            bowel preparation was adequate. The rectum was                            photographed. The entire colon was not examined. Scope In: 8:37:38 AM Scope Out: 9:01:05 AM Total Procedure Duration: 0 hours 23 minutes 27 seconds  Findings:      The perianal and digital rectal examinations were normal.      Scattered diverticula were found in the sigmoid colon and descending       colon. Segment of colon apparently sequestered in large ventral hernia       sac. Utilizing multiple maneuvers including changing patient's position       and external abd pressure, I was unable to negotiate the scope through       the sequestered segment of colon to reach the cecum. There may have been       a 6-7 mm pedunculated polyp in the opening of the hernia sac which I       could not reach. Impression:               -  Diverticulosis in the sigmoid colon and in the                            descending colon. Large abdominal wall hernia                            precluded completion of colonoscopy.                           - No specimens collected. Moderate Sedation:      Moderate (conscious) sedation was personally administered by an       anesthesia professional. The following parameters were monitored: oxygen       saturation, heart rate, blood pressure, respiratory rate, EKG, adequacy       of pulmonary ventilation, and response to care. Total physician       intraservice time was 30 minutes. Recommendation:            - Patient has a contact number available for                            emergencies. The signs and symptoms of potential                            delayed complications were discussed with the                            patient. Return to normal activities tomorrow.                            Written discharge instructions were provided to the                            patient. Referred to [facility] for attempt at                            completing colonoscopy.                           - Repeat colonoscopy in 1 month because the                            examination was incomplete.                           - Return to GI office (date not yet determined).                           - Resume previous diet. Procedure Code(s):        --- Professional ---                           [phone], 53, Colonoscopy, flexible; diagnostic,                            including collection of specimen(s) by brushing or  washing, when performed (separate procedure) Diagnosis Code(s):        --- Professional ---                           Z86.010, Personal history of colonic polyps                           K57.30, Diverticulosis of large intestine without                            perforation or abscess without bleeding CPT copyright 2016 American [employer] Association. All rights reserved. The codes documented in this report are preliminary and upon coder review may  be revised to meet current compliance requirements. Cristopher Estimable. Bently Morath, MD Norvel Richards, MD 07/18/2017 9:59:02 AM This report has been signed electronically. Number of Addenda: 0

## 2017-07-18 NOTE — Telephone Encounter (Signed)
I received a call from Dr. Gala Romney and stated patient needs a referral to [facility] for a surveillance tcs Re: H/O polyps.  He was unable to complete his tcs today due to the patient having a large hernia.  Please make ASAP referral.

## 2017-07-18 NOTE — Anesthesia Preprocedure Evaluation (Signed)
Anesthesia Evaluation  Patient identified by MRN, date of birth, ID band Patient awake    Reviewed: Allergy & Precautions, NPO status , Patient's Chart, lab work & pertinent test results  Airway Mallampati: II  TM Distance: >3 FB Neck ROM: Full    Dental  (+) Teeth Intact   Pulmonary COPD, former smoker,    breath sounds clear to auscultation       Cardiovascular hypertension, Pt. on medications  Rhythm:Regular Rate:Normal     Neuro/Psych    GI/Hepatic negative GI ROS, Neg liver ROS,   Endo/Other  diabetes, Type 2, Oral Hypoglycemic Agents  Renal/GU negative Renal ROS     Musculoskeletal   Abdominal   Peds  Hematology   Anesthesia Other Findings   Reproductive/Obstetrics                             Anesthesia Physical Anesthesia Plan  ASA: III  Anesthesia Plan: MAC   Post-op Pain Management:    Induction: Intravenous  PONV Risk Score and Plan:   Airway Management Planned: Simple Face Mask  Additional Equipment:   Intra-op Plan:   Post-operative Plan:   Informed Consent: I have reviewed the patients History and Physical, chart, labs and discussed the procedure including the risks, benefits and alternatives for the proposed anesthesia with the patient or authorized representative who has indicated his/her understanding and acceptance.     Plan Discussed with:   Anesthesia Plan Comments: (1/2 amp D50 for cbg=79 in pre-op.)        Anesthesia Quick Evaluation

## 2017-07-18 NOTE — Telephone Encounter (Signed)
ASAP referral info faxed to [facility].

## 2017-07-18 NOTE — H&P (Signed)
_0 @   Primary Care Physician:  Rosita Fire, MD Primary Gastroenterologist:  Dr. Gala Romney  Pre-Procedure History & Physical: HPI:  Travis Blanchard is a 72 y.o. male here for surveillance colonoscopy. History of colonic polyps in the distant past. No bowel symptoms currently.  Past Medical History:  Diagnosis Date  . Arthritis   . Cancer [facility])    Prostate  . COPD (chronic obstructive pulmonary disease) ([place])   . Diabetes mellitus without complication ([place])   . Hyperlipidemia   . Hypertension     Past Surgical History:  Procedure Laterality Date  . COLONOSCOPY  2008   Dr. Gala Romney: 2 diminutive polyps in rectum (hyperplastic), otherwise normal  . COLONOSCOPY  2000   Per notes in epic, history of adenomas at outside facility  . PROSTATE SURGERY  2000   Prostate removal/Cancer    Prior to Admission medications   Medication Sig Start Date End Date Taking? Authorizing Provider  atorvastatin (LIPITOR) 20 MG tablet Take 20 mg by mouth daily.  03/04/13  Yes [provider]  lisinopril (PRINIVIL,ZESTRIL) 30 MG tablet Take 30 mg by mouth daily.  03/04/13  Yes [provider]  loratadine (CLARITIN) 10 MG tablet Take 10 mg by mouth daily.   Yes [provider]  metFORMIN (GLUCOPHAGE) 500 MG tablet Take 500 mg by mouth 2 (two) times daily with a meal.  03/03/13  Yes [provider]  Na Sulfate-K Sulfate-Mg Sulf 17.5-3.13-1.6 GM/177ML SOLN Take 1 kit by mouth as directed. 05/29/17  Yes Lorina Duffner, Cristopher Estimable, MD  naproxen sodium (ALEVE) 220 MG tablet Take 220 mg by mouth at bedtime.   Yes [provider]  psyllium (METAMUCIL) 58.6 % powder Take 1 packet by mouth at bedtime. SUGAR FREE ORANGE FLAVORED   Yes [provider]  VENTOLIN HFA 108 (90 BASE) MCG/ACT inhaler Inhale 1 puff into the lungs every 6 (six) hours as needed for wheezing or shortness of breath.  03/30/13  Yes [provider]  zolpidem (AMBIEN) 5 MG tablet Take 5 mg by mouth at  bedtime.  12/08/12  Yes [provider]    Allergies as of 05/29/2017 - Review Complete 05/29/2017  Allergen Reaction Noted  . Penicillins Hives and Itching 03/11/2013    Family History  Problem Relation Age of Onset  . Diabetes Mother   . Hypertension Mother   . Cancer Sister        Breast  . Colon cancer Neg Hx     Social History   Socioeconomic History  . Marital status: Married    Spouse name: Not on file  . Number of children: Not on file  . Years of education: Not on file  . Highest education level: Not on file  Social Needs  . Financial resource strain: Not on file  . Food insecurity - worry: Not on file  . Food insecurity - inability: Not on file  . Transportation needs - medical: Not on file  . Transportation needs - non-medical: Not on file  Occupational History  . Not on file  Tobacco Use  . Smoking status: Former Smoker    Types: Cigarettes    Last attempt to quit: 03/11/2002    Years since quitting: 15.3  . Smokeless tobacco: Never Used  Substance and Sexual Activity  . Alcohol use: No    Frequency: Never  . Drug use: No  . Sexual activity: Not on file  Other Topics Concern  . Not on file  Social History Narrative  .  Not on file    Review of Systems: See HPI, otherwise negative ROS  Physical Exam: There were no vitals taken for this visit. General:   Alert,  Well-developed, well-nourished, pleasant and cooperative in NAD Neck:  Supple; no masses or thyromegaly. No significant cervical adenopathy. Lungs:  Clear throughout to auscultation.   No wheezes, crackles, or rhonchi. No acute distress. Heart:  Regular rate and rhythm; no murmurs, clicks, rubs,  or gallops. Abdomen: Non-distended, normal bowel sounds. Moderate size ventral hernia. Easily reducible..  Pulses:  Normal pulses noted. Extremities:  Without clubbing or edema.  Impression:   Pleasant 72 year old gentleman here for surveillance colonoscopy. History of colonic  adenoma.  Recommendations: I have offered the patient a surveillance colonoscopy today.  The risks, benefits, limitations, alternatives and imponderables have been reviewed with the patient. Questions have been answered. All parties are agreeable.    Notice: This dictation was prepared with Dragon dictation along with smaller phrase technology. Any transcriptional errors that result from this process are unintentional and may not be corrected upon review.

## 2017-07-18 NOTE — Anesthesia Postprocedure Evaluation (Signed)
Anesthesia Post Note  Patient: Travis Blanchard  Procedure(s) Performed: COLONOSCOPY WITH PROPOFOL (N/A )  Patient location during evaluation: PACU Anesthesia Type: MAC Level of consciousness: awake and patient cooperative Pain management: pain level controlled Vital Signs Assessment: post-procedure vital signs reviewed and stable Respiratory status: spontaneous breathing, nonlabored ventilation and respiratory function stable Cardiovascular status: blood pressure returned to baseline Postop Assessment: no apparent nausea or vomiting Anesthetic complications: no     Last Vitals: There were no vitals filed for this visit.  Last Pain:  Vitals:   07/18/17 0714  TempSrc: Oral                 Nilza Eaker J

## 2017-07-18 NOTE — Discharge Instructions (Signed)
°  Colonoscopy Discharge Instructions  Read the instructions outlined below and refer to this sheet in the next few weeks. These discharge instructions provide you with general information on caring for yourself after you leave the hospital. Your doctor may also give you specific instructions. While your treatment has been planned according to the most current medical practices available, unavoidable complications occasionally occur. If you have any problems or questions after discharge, call Dr. Gala Romney at [phone]. ACTIVITY  You may resume your regular activity, but move at a slower pace for the next 24 hours.   Take frequent rest periods for the next 24 hours.   Walking will help get rid of the air and reduce the bloated feeling in your belly (abdomen).   No driving for 24 hours (because of the medicine (anesthesia) used during the test).    Do not sign any important legal documents or operate any machinery for 24 hours (because of the anesthesia used during the test).  NUTRITION  Drink plenty of fluids.   You may resume your normal diet as instructed by your doctor.   Begin with a light meal and progress to your normal diet. Heavy or fried foods are harder to digest and may make you feel sick to your stomach (nauseated).   Avoid alcoholic beverages for 24 hours or as instructed.  MEDICATIONS  You may resume your normal medications unless your doctor tells you otherwise.  WHAT YOU CAN EXPECT TODAY  Some feelings of bloating in the abdomen.   Passage of more gas than usual.   Spotting of blood in your stool or on the toilet paper.  IF YOU HAD POLYPS REMOVED DURING THE COLONOSCOPY:  No aspirin products for 7 days or as instructed.   No alcohol for 7 days or as instructed.   Eat a soft diet for the next 24 hours.  FINDING OUT THE RESULTS OF YOUR TEST Not all test results are available during your visit. If your test results are not back during the visit, make an appointment  with your caregiver to find out the results. Do not assume everything is normal if you have not heard from your caregiver or the medical facility. It is important for you to follow up on all of your test results.  SEEK IMMEDIATE MEDICAL ATTENTION IF:  You have more than a spotting of blood in your stool.   Your belly is swollen (abdominal distention).   You are nauseated or vomiting.   You have a temperature over 101.   You have abdominal pain or discomfort that is severe or gets worse throughout the day.    Your colonoscopy was incomplete due to the presence of large abdominal hernia.  You will be referred to [facility] for the doctors down there to attempt completion of colonoscopy to remove any polyps that may exist

## 2017-07-22 ENCOUNTER — Encounter (HOSPITAL_COMMUNITY): Payer: Self-pay | Admitting: Internal Medicine

## 2017-07-23 NOTE — Telephone Encounter (Signed)
Called [place] to check on referral and nothing has been scheduled yet but confirmed they did receive order and working on this.

## 2017-08-01 DIAGNOSIS — D123 Benign neoplasm of transverse colon: Secondary | ICD-10-CM | POA: Diagnosis not present

## 2017-08-01 DIAGNOSIS — E785 Hyperlipidemia, unspecified: Secondary | ICD-10-CM | POA: Diagnosis not present

## 2017-08-01 DIAGNOSIS — Z8546 Personal history of malignant neoplasm of prostate: Secondary | ICD-10-CM | POA: Diagnosis not present

## 2017-08-01 DIAGNOSIS — M1990 Unspecified osteoarthritis, unspecified site: Secondary | ICD-10-CM | POA: Diagnosis not present

## 2017-08-01 DIAGNOSIS — K439 Ventral hernia without obstruction or gangrene: Secondary | ICD-10-CM | POA: Diagnosis not present

## 2017-08-01 DIAGNOSIS — Z7951 Long term (current) use of inhaled steroids: Secondary | ICD-10-CM | POA: Diagnosis not present

## 2017-08-01 DIAGNOSIS — J449 Chronic obstructive pulmonary disease, unspecified: Secondary | ICD-10-CM | POA: Diagnosis not present

## 2017-08-01 DIAGNOSIS — Z880 Allergy status to penicillin: Secondary | ICD-10-CM | POA: Diagnosis not present

## 2017-08-01 DIAGNOSIS — Z1211 Encounter for screening for malignant neoplasm of colon: Secondary | ICD-10-CM | POA: Diagnosis not present

## 2017-08-01 DIAGNOSIS — Z79899 Other long term (current) drug therapy: Secondary | ICD-10-CM | POA: Diagnosis not present

## 2017-08-01 DIAGNOSIS — Z7984 Long term (current) use of oral hypoglycemic drugs: Secondary | ICD-10-CM | POA: Diagnosis not present

## 2017-08-01 DIAGNOSIS — K648 Other hemorrhoids: Secondary | ICD-10-CM | POA: Diagnosis not present

## 2017-08-01 DIAGNOSIS — E119 Type 2 diabetes mellitus without complications: Secondary | ICD-10-CM | POA: Diagnosis not present

## 2017-08-01 DIAGNOSIS — Z86010 Personal history of colonic polyps: Secondary | ICD-10-CM | POA: Diagnosis not present

## 2017-08-01 DIAGNOSIS — I10 Essential (primary) hypertension: Secondary | ICD-10-CM | POA: Diagnosis not present

## 2017-08-01 DIAGNOSIS — D126 Benign neoplasm of colon, unspecified: Secondary | ICD-10-CM | POA: Diagnosis not present

## 2017-08-01 HISTORY — PX: COLONOSCOPY: SHX174

## 2017-08-13 DIAGNOSIS — I10 Essential (primary) hypertension: Secondary | ICD-10-CM | POA: Diagnosis not present

## 2017-08-13 DIAGNOSIS — Z6841 Body Mass Index (BMI) 40.0 and over, adult: Secondary | ICD-10-CM | POA: Diagnosis not present

## 2017-08-13 DIAGNOSIS — E119 Type 2 diabetes mellitus without complications: Secondary | ICD-10-CM | POA: Diagnosis not present

## 2017-08-13 DIAGNOSIS — M1990 Unspecified osteoarthritis, unspecified site: Secondary | ICD-10-CM | POA: Diagnosis not present

## 2017-08-13 DIAGNOSIS — Z7189 Other specified counseling: Secondary | ICD-10-CM | POA: Diagnosis not present

## 2017-08-13 DIAGNOSIS — K429 Umbilical hernia without obstruction or gangrene: Secondary | ICD-10-CM | POA: Diagnosis not present

## 2017-10-03 DIAGNOSIS — K2980 Duodenitis without bleeding: Secondary | ICD-10-CM | POA: Diagnosis not present

## 2017-10-03 DIAGNOSIS — Z7984 Long term (current) use of oral hypoglycemic drugs: Secondary | ICD-10-CM | POA: Diagnosis not present

## 2017-10-03 DIAGNOSIS — J449 Chronic obstructive pulmonary disease, unspecified: Secondary | ICD-10-CM | POA: Diagnosis not present

## 2017-10-03 DIAGNOSIS — Z880 Allergy status to penicillin: Secondary | ICD-10-CM | POA: Diagnosis not present

## 2017-10-03 DIAGNOSIS — K2990 Gastroduodenitis, unspecified, without bleeding: Secondary | ICD-10-CM | POA: Diagnosis not present

## 2017-10-03 DIAGNOSIS — K222 Esophageal obstruction: Secondary | ICD-10-CM | POA: Diagnosis not present

## 2017-10-03 DIAGNOSIS — I10 Essential (primary) hypertension: Secondary | ICD-10-CM | POA: Diagnosis not present

## 2017-10-03 DIAGNOSIS — Z6841 Body Mass Index (BMI) 40.0 and over, adult: Secondary | ICD-10-CM | POA: Diagnosis not present

## 2017-10-03 DIAGNOSIS — B9681 Helicobacter pylori [H. pylori] as the cause of diseases classified elsewhere: Secondary | ICD-10-CM | POA: Diagnosis not present

## 2017-10-03 DIAGNOSIS — K210 Gastro-esophageal reflux disease with esophagitis: Secondary | ICD-10-CM | POA: Diagnosis not present

## 2017-10-03 DIAGNOSIS — Z8546 Personal history of malignant neoplasm of prostate: Secondary | ICD-10-CM | POA: Diagnosis not present

## 2017-10-03 DIAGNOSIS — K2970 Gastritis, unspecified, without bleeding: Secondary | ICD-10-CM | POA: Diagnosis not present

## 2017-10-03 DIAGNOSIS — Z01818 Encounter for other preprocedural examination: Secondary | ICD-10-CM | POA: Diagnosis not present

## 2017-10-03 DIAGNOSIS — E785 Hyperlipidemia, unspecified: Secondary | ICD-10-CM | POA: Diagnosis not present

## 2017-10-03 DIAGNOSIS — K449 Diaphragmatic hernia without obstruction or gangrene: Secondary | ICD-10-CM | POA: Diagnosis not present

## 2017-10-03 DIAGNOSIS — Z79899 Other long term (current) drug therapy: Secondary | ICD-10-CM | POA: Diagnosis not present

## 2017-10-03 DIAGNOSIS — E119 Type 2 diabetes mellitus without complications: Secondary | ICD-10-CM | POA: Diagnosis not present

## 2017-10-07 DIAGNOSIS — E782 Mixed hyperlipidemia: Secondary | ICD-10-CM | POA: Diagnosis not present

## 2017-10-07 DIAGNOSIS — E160 Drug-induced hypoglycemia without coma: Secondary | ICD-10-CM | POA: Diagnosis not present

## 2017-10-07 DIAGNOSIS — E1165 Type 2 diabetes mellitus with hyperglycemia: Secondary | ICD-10-CM | POA: Diagnosis not present

## 2017-10-07 DIAGNOSIS — I10 Essential (primary) hypertension: Secondary | ICD-10-CM | POA: Diagnosis not present

## 2017-10-15 DIAGNOSIS — F17210 Nicotine dependence, cigarettes, uncomplicated: Secondary | ICD-10-CM | POA: Diagnosis not present

## 2017-10-15 DIAGNOSIS — E860 Dehydration: Secondary | ICD-10-CM | POA: Diagnosis not present

## 2017-10-15 DIAGNOSIS — M1990 Unspecified osteoarthritis, unspecified site: Secondary | ICD-10-CM | POA: Diagnosis not present

## 2017-10-15 DIAGNOSIS — E119 Type 2 diabetes mellitus without complications: Secondary | ICD-10-CM | POA: Diagnosis not present

## 2017-10-15 DIAGNOSIS — Z880 Allergy status to penicillin: Secondary | ICD-10-CM | POA: Diagnosis not present

## 2017-10-15 DIAGNOSIS — Z79899 Other long term (current) drug therapy: Secondary | ICD-10-CM | POA: Diagnosis not present

## 2017-10-15 DIAGNOSIS — I10 Essential (primary) hypertension: Secondary | ICD-10-CM | POA: Diagnosis not present

## 2017-10-15 DIAGNOSIS — A048 Other specified bacterial intestinal infections: Secondary | ICD-10-CM | POA: Diagnosis not present

## 2017-10-15 DIAGNOSIS — I771 Stricture of artery: Secondary | ICD-10-CM | POA: Diagnosis not present

## 2017-10-15 DIAGNOSIS — Z800 Family history of malignant neoplasm of digestive organs: Secondary | ICD-10-CM | POA: Diagnosis not present

## 2017-10-15 DIAGNOSIS — J449 Chronic obstructive pulmonary disease, unspecified: Secondary | ICD-10-CM | POA: Diagnosis not present

## 2017-10-15 DIAGNOSIS — Z6841 Body Mass Index (BMI) 40.0 and over, adult: Secondary | ICD-10-CM | POA: Diagnosis not present

## 2017-10-15 DIAGNOSIS — R799 Abnormal finding of blood chemistry, unspecified: Secondary | ICD-10-CM | POA: Diagnosis not present

## 2017-12-17 DIAGNOSIS — E785 Hyperlipidemia, unspecified: Secondary | ICD-10-CM | POA: Diagnosis not present

## 2017-12-17 DIAGNOSIS — J449 Chronic obstructive pulmonary disease, unspecified: Secondary | ICD-10-CM | POA: Diagnosis not present

## 2017-12-17 DIAGNOSIS — A499 Bacterial infection, unspecified: Secondary | ICD-10-CM | POA: Diagnosis not present

## 2017-12-17 DIAGNOSIS — E669 Obesity, unspecified: Secondary | ICD-10-CM | POA: Diagnosis not present

## 2017-12-17 DIAGNOSIS — A048 Other specified bacterial intestinal infections: Secondary | ICD-10-CM | POA: Diagnosis not present

## 2017-12-17 DIAGNOSIS — E559 Vitamin D deficiency, unspecified: Secondary | ICD-10-CM | POA: Diagnosis not present

## 2017-12-17 DIAGNOSIS — I10 Essential (primary) hypertension: Secondary | ICD-10-CM | POA: Diagnosis not present

## 2017-12-17 DIAGNOSIS — Z720 Tobacco use: Secondary | ICD-10-CM | POA: Diagnosis not present

## 2017-12-17 DIAGNOSIS — E6609 Other obesity due to excess calories: Secondary | ICD-10-CM | POA: Diagnosis not present

## 2017-12-17 DIAGNOSIS — Z880 Allergy status to penicillin: Secondary | ICD-10-CM | POA: Diagnosis not present

## 2017-12-17 DIAGNOSIS — Z6841 Body Mass Index (BMI) 40.0 and over, adult: Secondary | ICD-10-CM | POA: Diagnosis not present

## 2017-12-17 DIAGNOSIS — Z7189 Other specified counseling: Secondary | ICD-10-CM | POA: Diagnosis not present

## 2017-12-17 DIAGNOSIS — M1990 Unspecified osteoarthritis, unspecified site: Secondary | ICD-10-CM | POA: Diagnosis not present

## 2017-12-17 DIAGNOSIS — E119 Type 2 diabetes mellitus without complications: Secondary | ICD-10-CM | POA: Diagnosis not present

## 2017-12-17 DIAGNOSIS — Z87891 Personal history of nicotine dependence: Secondary | ICD-10-CM | POA: Diagnosis not present

## 2018-01-23 DIAGNOSIS — I10 Essential (primary) hypertension: Secondary | ICD-10-CM | POA: Diagnosis not present

## 2018-01-23 DIAGNOSIS — K429 Umbilical hernia without obstruction or gangrene: Secondary | ICD-10-CM | POA: Diagnosis not present

## 2018-01-23 DIAGNOSIS — E119 Type 2 diabetes mellitus without complications: Secondary | ICD-10-CM | POA: Diagnosis not present

## 2018-02-06 ENCOUNTER — Other Ambulatory Visit: Payer: Self-pay

## 2018-03-11 ENCOUNTER — Encounter (INDEPENDENT_AMBULATORY_CARE_PROVIDER_SITE_OTHER): Payer: Self-pay

## 2018-03-25 ENCOUNTER — Encounter (INDEPENDENT_AMBULATORY_CARE_PROVIDER_SITE_OTHER): Payer: Self-pay | Admitting: Bariatrics

## 2018-03-25 ENCOUNTER — Ambulatory Visit (INDEPENDENT_AMBULATORY_CARE_PROVIDER_SITE_OTHER): Payer: Medicare Other | Admitting: Bariatrics

## 2018-03-25 VITALS — BP 132/72 | HR 77 | Temp 98.1°F | Ht 70.0 in | Wt 294.0 lb

## 2018-03-25 DIAGNOSIS — Z6841 Body Mass Index (BMI) 40.0 and over, adult: Secondary | ICD-10-CM | POA: Diagnosis not present

## 2018-03-25 DIAGNOSIS — I10 Essential (primary) hypertension: Secondary | ICD-10-CM

## 2018-03-25 DIAGNOSIS — Z1331 Encounter for screening for depression: Secondary | ICD-10-CM

## 2018-03-25 DIAGNOSIS — R5383 Other fatigue: Secondary | ICD-10-CM | POA: Diagnosis not present

## 2018-03-25 DIAGNOSIS — E559 Vitamin D deficiency, unspecified: Secondary | ICD-10-CM | POA: Diagnosis not present

## 2018-03-25 DIAGNOSIS — F3289 Other specified depressive episodes: Secondary | ICD-10-CM

## 2018-03-25 DIAGNOSIS — E118 Type 2 diabetes mellitus with unspecified complications: Secondary | ICD-10-CM | POA: Diagnosis not present

## 2018-03-25 DIAGNOSIS — Z8639 Personal history of other endocrine, nutritional and metabolic disease: Secondary | ICD-10-CM

## 2018-03-25 DIAGNOSIS — R0602 Shortness of breath: Secondary | ICD-10-CM

## 2018-03-25 DIAGNOSIS — Z0289 Encounter for other administrative examinations: Secondary | ICD-10-CM

## 2018-03-26 LAB — COMPREHENSIVE METABOLIC PANEL
ALT: 16 IU/L (ref 0–44)
AST: 15 IU/L (ref 0–40)
Albumin/Globulin Ratio: 1.9 (ref 1.2–2.2)
Albumin: 4.5 g/dL (ref 3.5–4.8)
Alkaline Phosphatase: 92 IU/L (ref 39–117)
BUN/Creatinine Ratio: 12 (ref 10–24)
BUN: 12 mg/dL (ref 8–27)
Bilirubin Total: 0.3 mg/dL (ref 0.0–1.2)
CALCIUM: 9.7 mg/dL (ref 8.6–10.2)
CHLORIDE: 102 mmol/L (ref 96–106)
CO2: 18 mmol/L — AB (ref 20–29)
Creatinine, Ser: 0.97 mg/dL (ref 0.76–1.27)
GFR, EST AFRICAN AMERICAN: 91 mL/min/{1.73_m2} (ref >59)
GFR, EST NON AFRICAN AMERICAN: 79 mL/min/{1.73_m2} (ref >59)
GLUCOSE: 108 mg/dL — AB (ref 65–99)
Globulin, Total: 2.4 g/dL (ref 1.5–4.5)
Potassium: 4.6 mmol/L (ref 3.5–5.2)
Sodium: 144 mmol/L (ref 134–144)
TOTAL PROTEIN: 6.9 g/dL (ref 6.0–8.5)

## 2018-03-26 LAB — LIPID PANEL
CHOLESTEROL TOTAL: 170 mg/dL (ref 100–199)
Chol/HDL Ratio: 3.3 ratio (ref 0.0–5.0)
HDL: 52 mg/dL (ref >39)
LDL CALC: 100 mg/dL — AB (ref 0–99)
TRIGLYCERIDES: 88 mg/dL (ref 0–149)
VLDL CHOLESTEROL CAL: 18 mg/dL (ref 5–40)

## 2018-03-26 LAB — HEMOGLOBIN A1C
Est. average glucose Bld gHb Est-mCnc: 143 mg/dL
Hgb A1c MFr Bld: 6.6 % — ABNORMAL HIGH (ref 4.8–5.6)

## 2018-03-26 LAB — VITAMIN B12: Vitamin B-12: 865 pg/mL (ref 232–1245)

## 2018-03-26 LAB — VITAMIN D 25 HYDROXY (VIT D DEFICIENCY, FRACTURES): Vit D, 25-Hydroxy: 42.6 ng/mL (ref 30.0–100.0)

## 2018-03-27 NOTE — Progress Notes (Signed)
Office: [phone]  /  Fax: [phone]   Dear Torrie Mayers, FNP,   Thank you for referring Travis Blanchard to our clinic. The following note includes my evaluation and treatment recommendations.  HPI:   Chief Complaint: OBESITY    Travis Blanchard has been referred by Torrie Mayers, FNP for consultation regarding his obesity and obesity related comorbidities.    Travis Blanchard (MR# [id number]) is a 72 y.o. male who presents on 03/25/2018 for obesity evaluation and treatment. Current BMI is Body mass index is 42.18 kg/m.Travis Blanchard has been struggling with his weight for many years and has been unsuccessful in either losing weight, maintaining weight loss, or reaching his healthy weight goal.     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 He has to get his weight down to 225 lbs approximately. He admits to late night binges, occasionally skips meals, and he is snaking at night.     Travis Blanchard attended our information session and states he is currently in the action stage of change and ready to dedicate time achieving and maintaining a healthier weight. Travis Blanchard is interested in becoming our patient and working on intensive lifestyle modifications including (but not limited to) diet, exercise and weight loss.    Travis Blanchard states his family eats meals together he thinks his family will eat healthier with  him his desired weight loss is 79 lbs he started gaining weight after prostate surgery and knee injuries his heaviest weight ever was 315 lbs he has significant food cravings issues  he snacks frequently in the evenings he skips meals frequently he is frequently drinking liquids with calories he frequently eats larger portions than normal  he has binge eating behaviors he struggles with emotional eating    Fatigue Travis Blanchard feels his energy is lower than it should be. This has worsened with weight gain and has not worsened recently. Travis Blanchard admits to daytime somnolence  and  denies waking up still tired. Patient is at risk for obstructive sleep apnea. Patent has a history of symptoms of daytime fatigue. Patient generally gets 7 hours of sleep per night, and states they generally have generally restful sleep. Snoring is not present. Apneic episodes are present. Epworth Sleepiness Score is 5.  Dyspnea on exertion Travis Blanchard notes increasing shortness of breath with exercising and seems to be worsening over time with weight gain. He notes getting out of breath sooner with activity than he used to. This has not gotten worse recently. Travis Blanchard denies orthopnea.  Hypertension Travis Blanchard is a 72 y.o. male with hypertension. Travis Blanchard's blood pressure is well controlled. He denies hypotension or lightheadedness. He is taking lisinopril with no side effects. He is working weight loss to help control his blood pressure with the goal of decreasing his risk of heart attack and stroke.   Diabetes II Travis Blanchard has a diagnosis of diabetes type II. Caide is on metformin with no side effects. His last A1c was 6.1 on 10/15/17. He denies polyphagia, polyuria, or polydipsia. He has been working on intensive lifestyle modifications including diet, exercise, and weight loss to help control his blood glucose levels.  History of Vitamin B12 Deficiency Halvor has a diagnosis of B12 insufficiency and notes fatigue. He is taking vitamin B12 OTC. He is not a vegetarian and does not have a previous diagnosis of pernicious anemia. He does not have a history of weight loss surgery.   Vitamin D Deficiency Travis Blanchard has a diagnosis of  vitamin D deficiency. He is taking OTC Vit D and denies nausea, vomiting or muscle weakness.  Depression with emotional eating behaviors Travis Blanchard notes binge eating but no formal diagnosis. Travis Blanchard struggles with emotional eating and using food for comfort to the extent that it is negatively impacting his health. He often snacks when he is not hungry. Travis Blanchard sometimes feels he is out of control and  then feels guilty that he made poor food choices. He has been working on behavior modification techniques to help reduce his emotional eating and has been somewhat successful. He shows no sign of suicidal or homicidal ideations.  Depression screen PHQ 2/9 03/25/2018  Decreased Interest 3  Down, Depressed, Hopeless 1  PHQ - 2 Score 4  Altered sleeping 1  Tired, decreased energy 1  Change in appetite 1  Feeling bad or failure about yourself  0  Trouble concentrating 0  Moving slowly or fidgety/restless 0  Suicidal thoughts 0  PHQ-9 Score 7  Difficult doing work/chores Not difficult at all    Depression Screen Travis Blanchard's Food and Mood (modified PHQ-9) score was  Depression screen PHQ 2/9 03/25/2018  Decreased Interest 3  Down, Depressed, Hopeless 1  PHQ - 2 Score 4  Altered sleeping 1  Tired, decreased energy 1  Change in appetite 1  Feeling bad or failure about yourself  0  Trouble concentrating 0  Moving slowly or fidgety/restless 0  Suicidal thoughts 0  PHQ-9 Score 7  Difficult doing work/chores Not difficult at all    ALLERGIES: Allergies  Allergen Reactions  . Penicillins Hives, Itching and Other (See Comments)    Has patient had a PCN reaction causing immediate rash, facial/tongue/throat swelling, SOB or lightheadedness with hypotension: Yes Has patient had a PCN reaction causing severe rash involving mucus membranes or skin necrosis: No Has patient had a PCN reaction that required hospitalization: No Has patient had a PCN reaction occurring within the last 10 years: No If all of the above answers are "NO", then may proceed with Cephalosporin use.     MEDICATIONS: Current Outpatient Medications on File Prior to Visit  Medication Sig Dispense Refill  . acetaminophen (TYLENOL) 500 MG tablet Take 1,000 mg by mouth every 6 (six) hours as needed.    [name] atorvastatin (LIPITOR) 20 MG tablet Take 20 mg by mouth daily.     . Cholecalciferol (VITAMIN D3) 1000 units CAPS Take by  mouth.    . fluticasone (FLONASE) 50 MCG/ACT nasal spray Place into both nostrils daily.    [name] ipratropium-albuterol (DUONEB) 0.5-2.5 (3) MG/3ML SOLN Take 3 mLs by nebulization.    [name] lisinopril (PRINIVIL,ZESTRIL) 30 MG tablet Take 30 mg by mouth daily.     [name] loratadine (CLARITIN) 10 MG tablet Take 10 mg by mouth daily.    . metFORMIN (GLUCOPHAGE) 500 MG tablet Take 500 mg by mouth 2 (two) times daily with a meal.     . naproxen sodium (ALEVE) 220 MG tablet Take 220 mg by mouth at bedtime.    . psyllium (METAMUCIL) 58.6 % powder Take 1 packet by mouth at bedtime. SUGAR FREE ORANGE FLAVORED    . VENTOLIN HFA 108 (90 BASE) MCG/ACT inhaler Inhale 1 puff into the lungs every 6 (six) hours as needed for wheezing or shortness of breath.     . vitamin B-12 (CYANOCOBALAMIN) 1000 MCG tablet Take 1,000 mcg by mouth daily.    [name] zolpidem (AMBIEN) 5 MG tablet Take 5 mg by mouth at bedtime.  No current facility-administered medications on file prior to visit.     PAST MEDICAL HISTORY: Past Medical History:  Diagnosis Date  . Arthritis   . Asthma   . B12 deficiency   . Cancer [facility])    Prostate  . COPD (chronic obstructive pulmonary disease) ([place])   . Cough   . Decreased hearing   . Diabetes mellitus without complication ([place])   . Floaters in visual field   . Frequent urination   . Hoarseness   . Hyperlipidemia   . Hypertension   . Joint pain   . Lower back pain   . Muscle pain   . Muscle stiffness   . Neck stiffness   . Right hip pain   . Right knee pain   . Shortness of breath    with sudden awakening from sleep  . Tinnitus   . Trouble in sleeping   . Umbilical hernia   . Vitamin D deficiency   . Wheezing     PAST SURGICAL HISTORY: Past Surgical History:  Procedure Laterality Date  . COLONOSCOPY  2008   Dr. Gala Romney: 2 diminutive polyps in rectum (hyperplastic), otherwise normal  . COLONOSCOPY  2000   Per notes in epic, history of adenomas at outside facility  . COLONOSCOPY  WITH PROPOFOL N/A 07/18/2017   Procedure: COLONOSCOPY WITH PROPOFOL;  Surgeon: Daneil Dolin, MD;  Location: [facility];  Service: Endoscopy;  Laterality: N/A;  8:45  . HEMORRHOID SURGERY    . KNEE ARTHROSCOPY     Left  . KNEE ARTHROSCOPY     right  . PROSTATE SURGERY  2000   Prostate removal/Cancer    SOCIAL HISTORY: Social History   Tobacco Use  . Smoking status: Former Smoker    Types: Cigarettes    Last attempt to quit: 03/11/2002    Years since quitting: 16.0  . Smokeless tobacco: Never Used  Substance Use Topics  . Alcohol use: No    Frequency: Never  . Drug use: No    FAMILY HISTORY: Family History  Problem Relation Age of Onset  . Diabetes Mother   . Hypertension Mother   . Hyperlipidemia Mother   . Stroke Mother   . Heart disease Mother   . Depression Mother   . Obesity Mother   . Eating disorder Mother   . Cancer Father   . Depression Father   . Anxiety disorder Father   . Liver disease Father   . Alcoholism Father   . Cancer Sister        Breast  . Colon cancer Neg Hx     ROS: Review of Systems  Constitutional: Positive for malaise/fatigue. Negative for weight loss.       + Trouble sleeping  HENT: Positive for tinnitus.        + Decreased hearing + Hoarseness  Eyes:       + Vision changes + Wear glasses or contacts + Floaters  Respiratory: Positive for cough, shortness of breath (with exertion) and wheezing.   Cardiovascular: Negative for orthopnea.       + Sudden awakening from sleep with shortness of breath  Genitourinary: Positive for frequency.  Musculoskeletal: Positive for back pain.       Negative muscle weakness + Muscle or joint pain + Muscle stiffness + Neck stiffness  Neurological:       Negative lightheadedness  Endo/Heme/Allergies: Negative for polydipsia.       Negative polyphagia  Psychiatric/Behavioral: Positive for depression. Negative for  suicidal ideas.    PHYSICAL EXAM: Blood pressure 132/72, pulse 77,  temperature 98.1 F (36.7 C), temperature source Oral, height 5\' 10"  (1.778 m), weight 294 lb (133.4 kg), SpO2 98 %. Body mass index is 42.18 kg/m. Physical Exam  Constitutional: He is oriented to person, place, and time. He appears well-developed and well-nourished.  HENT:  Head: Normocephalic and atraumatic.  Nose: Nose normal.  + Mallampati= 4  Eyes: EOM are normal. No scleral icterus.  Neck: Normal range of motion. Neck supple. No thyromegaly present.  Cardiovascular: Normal rate and regular rhythm.  Pulmonary/Chest: Effort normal. No respiratory distress.  Abdominal: Soft. There is no tenderness.  + Obesity + Umbilical abdominal hernia  Musculoskeletal:  Range of Motion normal in all 4 extremities Trace edema noted in bilateral lower extremities  Neurological: He is alert and oriented to person, place, and time. Coordination normal.  Skin: Skin is warm and dry.  Psychiatric: He has a normal mood and affect. His behavior is normal.  Vitals reviewed.   RECENT LABS AND TESTS: BMET    Component Value Date/Time   NA 144 03/25/2018 1041   K 4.6 03/25/2018 1041   CL 102 03/25/2018 1041   CO2 18 (L) 03/25/2018 1041   GLUCOSE 108 (H) 03/25/2018 1041   GLUCOSE 158 (H) 07/12/2017 0850   BUN 12 03/25/2018 1041   CREATININE 0.97 03/25/2018 1041   CALCIUM 9.7 03/25/2018 1041   GFRNONAA 79 03/25/2018 1041   GFRAA 91 03/25/2018 1041   Lab Results  Component Value Date   HGBA1C 6.6 (H) 03/25/2018   No results found for: INSULIN CBC    Component Value Date/Time   WBC 7.6 07/12/2017 0850   RBC 5.05 07/12/2017 0850   HGB 14.3 07/12/2017 0850   HCT 45.1 07/12/2017 0850   PLT 213 07/12/2017 0850   MCV 89.3 07/12/2017 0850   MCH 28.3 07/12/2017 0850   MCHC 31.7 07/12/2017 0850   RDW 12.9 07/12/2017 0850   LYMPHSABS 2.8 07/12/2017 0850   MONOABS 0.3 07/12/2017 0850   EOSABS 0.1 07/12/2017 0850   BASOSABS 0.0 07/12/2017 0850   Iron/TIBC/Ferritin/ %Sat No results found  for: IRON, TIBC, FERRITIN, IRONPCTSAT Lipid Panel     Component Value Date/Time   CHOL 170 03/25/2018 1041   TRIG 88 03/25/2018 1041   HDL 52 03/25/2018 1041   CHOLHDL 3.3 03/25/2018 1041   LDLCALC 100 (H) 03/25/2018 1041   Hepatic Function Panel     Component Value Date/Time   PROT 6.9 03/25/2018 1041   ALBUMIN 4.5 03/25/2018 1041   AST 15 03/25/2018 1041   ALT 16 03/25/2018 1041   ALKPHOS 92 03/25/2018 1041   BILITOT 0.3 03/25/2018 1041   No results found for: TSH  ECG  shows NSR with a rate of 74 BPM INDIRECT CALORIMETER done today shows a VO2 of 271 and a REE of 1885.  His calculated basal metabolic rate is 3846 thus his basal metabolic rate is worse than expected.    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 Labs will be ordered, and in the meanwhile Dontarius has agreed to work on diet, exercise and weight loss to help with fatigue. Proper sleep hygiene was discussed including the need for 7-8 hours of quality sleep each night. A sleep study was not ordered based on symptoms and Epworth score.  Dyspnea on exertion Travis Blanchard's shortness of breath appears to be obesity related and exercise induced. He has agreed to work on weight loss and gradually increase exercise  to treat his exercise induced shortness of breath. If Lynkin follows our instructions and loses weight without improvement of his shortness of breath, we will plan to refer to pulmonology. We will monitor this condition regularly. Boleslaw agrees to this plan.  Hypertension We discussed sodium restriction, working on healthy weight loss, and a regular exercise program as the means to achieve improved blood pressure control. Nikoli agreed with this plan and agreed to follow up as directed. We will continue to monitor his blood pressure as well as his progress with the above lifestyle modifications. Chandler agrees to continue lisinopril at current dose and decrease sodium intake. He will watch for signs of hypotension as he continues his lifestyle modifications. We will check labs and Quinlan agrees to follow up with our clinic in 2 weeks.  Diabetes II Travis Blanchard has been given extensive diabetes education by myself today including ideal fasting and post-prandial blood glucose readings, individual ideal Hgb A1c goals and hypoglycemia prevention. We discussed the importance of good blood sugar control to decrease the likelihood of diabetic complications such as nephropathy, neuropathy, limb loss, blindness, coronary artery disease, and death. We discussed the importance of intensive lifestyle modification including diet, exercise and weight loss as the first line treatment for diabetes. Travis Blanchard agrees to continue metformin, decrease carbohydrates and night time eating. We will check labs today and Travis Blanchard agrees to follow up with our clinic in 2 weeks.  History of Vitamin B12 Deficiency Tauren will work on increasing B12 rich foods in his diet. B12 supplementation was not prescribed today. We will check labs today and Damario agrees to follow up with our clinic in 2 weeks.  Vitamin D Deficiency Rollins was informed that low vitamin D levels contributes to fatigue and are associated with obesity, breast, and colon cancer. He will follow up  for routine testing of vitamin D, at least 2-3 times per year, and increase Vit D rich foods. He was informed of the risk of over-replacement of vitamin D and agrees to not increase his dose unless he discusses this with [name] first. We will check labs today and Duncan agrees to follow up with our clinic in 2 weeks.  Depression with Emotional Eating Behaviors We discussed behavior modification techniques, and cognitive behavioral therapy techniques today to help Jayjay deal with his night time eating, emotional eating, and depression. We will refer to Dr. Mallie Mussel, our bariatric psychologist for evaluation. Travis Blanchard agrees to follow up with our clinic in 2 weeks.  Depression Screen Jusitn had a moderately positive depression screening. Depression is commonly associated with obesity and often results in emotional eating behaviors. We will monitor this closely and work on CBT to help improve the non-hunger eating patterns. Referral to Psychology may be required if no improvement is seen as he continues in our clinic.  Obesity Xxavier is currently in the action stage of change and his goal is to continue with weight loss efforts. I recommend Willam begin the structured treatment plan as follows:  He has agreed to follow the Category 3 plan Azavier has been instructed to eventually work up to a goal of 150 minutes of combined cardio and strengthening exercise per week for weight loss and overall health benefits. We discussed the following Behavioral Modification Strategies today: increasing lean protein intake, decreasing simple carbohydrates, increasing vegetables, decreasing sodium intake, work on meal planning and easy cooking plans, emotional eating strategies, ways to avoid night time snacking, increase H20 intake, and no skipping meals Abb will increase protein, stop skipping meals, decrease binge eating, and decrease portion size.   He was informed of the importance of frequent follow up visits to maximize his success  with intensive lifestyle modifications for his multiple health conditions. He was informed we would discuss his lab results at his next visit unless there is a critical issue that needs to be addressed sooner. Ennis agreed to keep his next visit at the agreed upon time to discuss these results.    OBESITY BEHAVIORAL INTERVENTION VISIT  Today's visit was # 1   Starting weight: 294 lbs Starting date: 03/25/18 Today's weight : 294 lbs  Today's date: 03/25/2018 Total lbs lost to date: 0 At least 15 minutes were spent on discussing the following behavioral intervention visit.   ASK: We discussed the diagnosis of obesity with Travis Blanchard today and Rand agreed to give [name] permission to discuss obesity behavioral modification therapy today.  ASSESS: Lemmie has the diagnosis of obesity and his BMI today is 42.18 Ivey is in the action stage of change   ADVISE: Hiroto was educated on the multiple health risks of obesity as well as the benefit of weight loss to improve his health. He was advised of the need for long term treatment and the importance of lifestyle modifications to improve his current health and to decrease his risk of future health problems.  AGREE: Multiple dietary modification options and treatment options were discussed and  Shaquil agreed to follow the recommendations documented in the above note.  ARRANGE: Jehu was educated on the importance of frequent visits to treat obesity as outlined per CMS and USPSTF guidelines and agreed to schedule his next follow up appointment today.  Wilhemena Durie, am acting as transcriptionist for [employer], DO  I have reviewed the above documentation for accuracy and completeness, and I agree with the above. -Jearld Lesch, DO

## 2018-04-01 ENCOUNTER — Ambulatory Visit (INDEPENDENT_AMBULATORY_CARE_PROVIDER_SITE_OTHER): Payer: Medicare Other | Admitting: Psychology

## 2018-04-01 DIAGNOSIS — F3289 Other specified depressive episodes: Secondary | ICD-10-CM | POA: Diagnosis not present

## 2018-04-01 DIAGNOSIS — E119 Type 2 diabetes mellitus without complications: Secondary | ICD-10-CM | POA: Diagnosis not present

## 2018-04-01 DIAGNOSIS — H2513 Age-related nuclear cataract, bilateral: Secondary | ICD-10-CM | POA: Diagnosis not present
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
## 2018-04-09 ENCOUNTER — Ambulatory Visit (INDEPENDENT_AMBULATORY_CARE_PROVIDER_SITE_OTHER): Payer: Medicare Other | Admitting: Bariatrics

## 2018-04-09 VITALS — BP 120/72 | HR 92 | Temp 98.1°F | Ht 70.0 in | Wt 292.0 lb

## 2018-04-09 DIAGNOSIS — I10 Essential (primary) hypertension: Secondary | ICD-10-CM | POA: Diagnosis not present

## 2018-04-09 DIAGNOSIS — Z8639 Personal history of other endocrine, nutritional and metabolic disease: Secondary | ICD-10-CM | POA: Diagnosis not present

## 2018-04-09 DIAGNOSIS — E1169 Type 2 diabetes mellitus with other specified complication: Secondary | ICD-10-CM

## 2018-04-09 DIAGNOSIS — Z6841 Body Mass Index (BMI) 40.0 and over, adult: Secondary | ICD-10-CM

## 2018-04-09 NOTE — Progress Notes (Signed)
Office: [phone]  /  Fax: [phone]   Date: April 16, 2018 Time Seen: 12:00pm Duration: 30 minutes Provider: Glennie Blanchard, Psy.D. Type of Session: Individual Therapy   HPI: Travis Blanchard was referred by Dr. Jearld Blanchard due to depression with emotional eating behaviors and was seen for an initial appointment with this provider on April 01, 2018. Per the note for the initial visit with Dr. Jearld Blanchard on March 25, 2018, "Travis Blanchard binge eating but no formal diagnosis. Travis Blanchard struggleswith emotional eating and using food for comfort to the extent that it is negatively impacting hishealth. Heoften snacks when heis not hungry. Travis Blanchard feels heis out of control and then feels guilty that hemade poor food choices. Hehas been working on behavior modification techniques to help reduce hisemotional eating and has been somewhat successful.Heshows no sign of suicidal or homicidal ideations." In addition during the initial appointment with Dr. Owens Blanchard, Travis Blanchard reported experiencing the following: significant food cravings issues; snacking frequently in the evenings; skipping meals frequently; frequently drinking liquids with calories; frequently eating larger portions than normal; binge eating behaviors; and struggling with emotional eating. Also, per the note for the initial visit with Dr. Owens Blanchard on March 25, 2018, Travis Blanchard. His heaviest Blanchard ever was 315 pounds. During the initial appointment with this provider, Travis Blanchard reported, "The reason I ended up at the clinic was because I failed a couple colonoscopies." He described having a hernia and various medical appointments resulting in the suggestion of bariatric surgery. After "weighing the pros and cons," Travis Blanchard discussed he decided to try Blanchard loss. Regarding emotional eating, Travis Blanchard indicated he tends to eat peanuts and ice cream at night while watching television. He reported  he stopped buying peanuts. Moreover, he described a reduction in eating larger portions since increased awareness of appropriate portion sizes. Since starting with the prescribed meal plan, Travis Blanchard stated, "It's going great. I feel better. 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 Furthermore, Travis Blanchard was asked to complete a questionnaire assessing various behaviors related to emotional eating. Travis Blanchard endorsed the following: overeat when you are celebrating, eat certain foods when you are anxious, stressed, depressed, or your feelings are hurt, not worry about what you eat when you are in a good mood and eat as a reward.  Session Content: Session focused on the following treatment goal: decrease emotional eating. The session was initiated with the administration of the PHQ-9 and GAD-7, as well as a brief check-in. Regarding eating, Travis Blanchard reported, "It's going great." He indicated he went out to eat yesterday and noted he brought 65% of the meal home to avoid feeling overstuffed. He was asked to describe what he ate the rest of the day. This provider reflected that Travis Blanchard is not eating all the protein prescribed in the meal plan. Travis Blanchard noted he is struggling with his protein intake and futher acknowledged he ate a bag of peanuts. This provider and Travis Blanchard discussed barriers to meeting his protein goal. Travis Blanchard was receptive to setting a goal of eating at least lunch or dinner at home to ensure he is consuming the recommended protein. In addition, psychoeducation regarding triggers for emotional eating was provided. Travis Blanchard was provided a handout, and encouraged to utilize the handout between now and the next appointment to increase awareness of triggers and frequency. Travis Blanchard agreed. Overall, Travis Blanchard was receptive to today's session as evidenced by his openness  to sharing, responsiveness to feedback and collaboration on developing a goal to  increase protein intake. He was also receptive to discussing and identifying triggers for emotional eating.  Mental Status Examination: Travis Blanchard arrived early for the appointment. He presented as appropriately dressed and groomed. Travis Blanchard appeared his stated age and demonstrated adequate orientation to time, place, person, and purpose of the appointment. He also demonstrated appropriate eye contact. No psychomotor abnormalities or behavioral peculiarities noted. His mood was euthymic with congruent affect. His thought processes were logical, linear, and goal-directed. No hallucinations, delusions, bizarre thinking or behavior reported or observed. Judgment, insight, and impulse control appeared to be grossly intact. There was no evidence of paraphasias (i.e., errors in speech, gross mispronunciations, and word substitutions), repetition deficits, or disturbances in volume or prosody (i.e., rhythm and intonation). There was no evidence of attention or memory impairments. Travis Blanchard denied current suicidal and homicidal ideation, intent or plan.  Structured Assessment Results: The Patient Health Questionnaire-9 (PHQ-9) is a self-report measure that assesses symptoms and severity of depression over the course of the last two weeks. Travis Blanchard obtained a score of three suggesting minimal depression. Travis Blanchard finds the endorsed symptoms to be not difficult at all. Depression screen PHQ 2/9 04/16/2018  Decreased Interest 0  Down, Depressed, Hopeless 0  PHQ - 2 Score 0  Altered sleeping 2  Tired, decreased energy 1  Change in appetite 0  Feeling bad or failure about yourself  0  Trouble concentrating 0  Moving slowly or fidgety/restless 0  Suicidal thoughts 0  PHQ-9 Score 3  Difficult doing work/chores -   The Generalized Anxiety Disorder-7 (GAD-7) is a brief self-report measure that assesses symptoms of anxiety over the course of the last two weeks. Travis Blanchard obtained a score of zero. GAD 7 : Generalized Anxiety Score 04/16/2018    Nervous, Anxious, on Edge 0  Control/stop worrying 0  Worry too much - different things 0  Trouble relaxing 0  Restless 0  Easily annoyed or irritable 0  Afraid - awful might happen 0  Total GAD 7 Score 0  Anxiety Difficulty -   Interventions: Clif was administered the PHQ-9 and GAD-7 for symptom monitoring. Content from the last session was reviewed. Throughout today's session, empathic reflections and validation were provided. This provider and Kiran discussed barriers to consuming his recommended protein. A goal was set. Psychoeducation regarding triggers for emotional eating was provided.  DSM-5 Diagnosis: 311 (F32.8) Other Specified Depressive Disorder, Emotional Eating  Treatment Goal & Progress: Satchel was seen for an initial appointment with this provider on April 01, 2018 to during which the following treatment goal was established: decrease emotional eating. Toan has demonstrated progress in his goal of decreasing emotional eating as evidenced by his willingness to discuss hunger patterns and identify triggers for emotional eating. He was also receptive to establishing a goal to increase protein intake.  Plan: Kile continues to appear able and willing to participate as evidenced by engagement in reciprocal conversation, and asking questions for clarification as appropriate. The next appointment will be scheduled in two weeks. The next session will focus on reviewing triggers for emotional eating, discussing progress towards the established protein goal, and working towards reducing overall emotional eating.

## 2018-04-10 DIAGNOSIS — E119 Type 2 diabetes mellitus without complications: Secondary | ICD-10-CM | POA: Insufficient documentation

## 2018-04-10 DIAGNOSIS — Z8639 Personal history of other endocrine, nutritional and metabolic disease: Secondary | ICD-10-CM | POA: Insufficient documentation

## 2018-04-10 DIAGNOSIS — I10 Essential (primary) hypertension: Secondary | ICD-10-CM | POA: Insufficient documentation

## 2018-04-10 DIAGNOSIS — E669 Obesity, unspecified: Secondary | ICD-10-CM | POA: Insufficient documentation

## 2018-04-10 NOTE — Progress Notes (Signed)
Office: [phone]  /  Fax: [phone]   HPI:   Chief Complaint: OBESITY Travis Blanchard is here to discuss his progress with his obesity treatment plan. He is on the Category 3 plan and is following his eating plan approximately 75 to 80 % of the time. He states he is walking 30 minutes 3 to 4 times per week. Travis Blanchard is "enjoying the diet" and his hunger is controlled. He is "not able to eat all the food." He is still struggling with late night eating.  His weight is 292 lb (132.5 kg) today and has had a weight loss of 2 pounds over a period of 2 weeks since his last visit. He has lost 2 lbs since starting treatment with [name].  Diabetes II Travis Blanchard has a diagnosis of diabetes type II. Travis Blanchard reports that his fasting BGs are typically in the 90's (<than 122) and 2 hours postprandial BGs range from 90's to 120's. He is taking metformin and is not having side effects. His last A1c was 6.6 on 03/25/18. He has been working on intensive lifestyle modifications including diet, exercise, and weight loss to help control his blood glucose levels.  Hypertension Travis Blanchard is a 72 y.o. male with hypertension. He is taking lisinopril 30mg . He is working on weight loss to help control his blood pressure with the goal of decreasing his risk of heart attack and stroke. Travis Blanchard's blood pressure is currently well controlled. He denies hypotension.  History of Vitamin D deficiency Travis Blanchard has a history of vitamin D deficiency. He is currently taking OTC vit D and denies nausea, vomiting or muscle weakness.  ALLERGIES: Allergies  Allergen Reactions  . Penicillins Hives, Itching and Other (See Comments)    Has patient had a PCN reaction causing immediate rash, facial/tongue/throat swelling, SOB or lightheadedness with hypotension: Yes Has patient had a PCN reaction causing severe rash involving mucus membranes or skin necrosis: No Has patient had a PCN reaction that required hospitalization: No Has patient had a PCN reaction  occurring within the last 10 years: No If all of the above answers are "NO", then may proceed with Cephalosporin use.     MEDICATIONS: Current Outpatient Medications on File Prior to Visit  Medication Sig Dispense Refill  . acetaminophen (TYLENOL) 500 MG tablet Take 1,000 mg by mouth every 6 (six) hours as needed.    [name] atorvastatin (LIPITOR) 20 MG tablet Take 20 mg by mouth daily.     . Cholecalciferol (VITAMIN D3) 1000 units CAPS Take by mouth.    . fluticasone (FLONASE) 50 MCG/ACT nasal spray Place into both nostrils daily.    [name] ipratropium-albuterol (DUONEB) 0.5-2.5 (3) MG/3ML SOLN Take 3 mLs by nebulization.    [name] lisinopril (PRINIVIL,ZESTRIL) 30 MG tablet Take 30 mg by mouth daily.     [name] loratadine (CLARITIN) 10 MG tablet Take 10 mg by mouth daily.    . metFORMIN (GLUCOPHAGE) 500 MG tablet Take 500 mg by mouth 2 (two) times daily with a meal.     . naproxen sodium (ALEVE) 220 MG tablet Take 220 mg by mouth at bedtime.    . psyllium (METAMUCIL) 58.6 % powder Take 1 packet by mouth at bedtime. SUGAR FREE ORANGE FLAVORED    . VENTOLIN HFA 108 (90 BASE) MCG/ACT inhaler Inhale 1 puff into the lungs every 6 (six) hours as needed for wheezing or shortness of breath.     . vitamin B-12 (CYANOCOBALAMIN) 1000 MCG tablet Take 1,000 mcg by mouth daily.    [name]  zolpidem (AMBIEN) 5 MG tablet Take 5 mg by mouth at bedtime.      No current facility-administered medications on file prior to visit.     PAST MEDICAL HISTORY: Past Medical History:  Diagnosis Date  . Arthritis   . Asthma   . B12 deficiency   . Cancer [facility])    Prostate  . COPD (chronic obstructive pulmonary disease) ([place])   . Cough   . Decreased hearing   . Diabetes mellitus without complication ([place])   . Floaters in visual field   . Frequent urination   . Hoarseness   . Hyperlipidemia   . Hypertension   . Joint pain   . Lower back pain   . Muscle pain   . Muscle stiffness   . Neck stiffness   . Right hip pain   . Right  knee pain   . Shortness of breath    with sudden awakening from sleep  . Tinnitus   . Trouble in sleeping   . Umbilical hernia   . Vitamin D deficiency   . Wheezing     PAST SURGICAL HISTORY: Past Surgical History:  Procedure Laterality Date  . COLONOSCOPY  2008   Dr. Gala Romney: 2 diminutive polyps in rectum (hyperplastic), otherwise normal  . COLONOSCOPY  2000   Per notes in epic, history of adenomas at outside facility  . COLONOSCOPY WITH PROPOFOL N/A 07/18/2017   Procedure: COLONOSCOPY WITH PROPOFOL;  Surgeon: Daneil Dolin, MD;  Location: [facility];  Service: Endoscopy;  Laterality: N/A;  8:45  . HEMORRHOID SURGERY    . KNEE ARTHROSCOPY     Left  . KNEE ARTHROSCOPY     right  . PROSTATE SURGERY  2000   Prostate removal/Cancer    SOCIAL HISTORY: Social History   Tobacco Use  . Smoking status: Former Smoker    Types: Cigarettes    Last attempt to quit: 03/11/2002    Years since quitting: 16.0  . Smokeless tobacco: Never Used  Substance Use Topics  . Alcohol use: No    Frequency: Never  . Drug use: No    FAMILY HISTORY: Family History  Problem Relation Age of Onset  . Diabetes Mother   . Hypertension Mother   . Hyperlipidemia Mother   . Stroke Mother   . Heart disease Mother   . Depression Mother   . Obesity Mother   . Eating disorder Mother   . Cancer Father   . Depression Father   . Anxiety disorder Father   . Liver disease Father   . Alcoholism Father   . Cancer Sister        Breast  . Colon cancer Neg Hx     ROS: Review of Systems  Constitutional: Positive for weight loss.  Cardiovascular:       Negative for hypotension.  Gastrointestinal: Negative for nausea and vomiting.  Musculoskeletal:       Negative for muscle weakness.    PHYSICAL EXAM: Blood pressure 120/72, pulse 92, temperature 98.1 F (36.7 C), temperature source Oral, height 5\' 10"  (1.778 m), weight 292 lb (132.5 kg), SpO2 99 %. Body mass index is 41.9 kg/m. Physical Exam    Constitutional: He is oriented to person, place, and time. He appears well-developed and well-nourished.  Cardiovascular: Normal rate.  Pulmonary/Chest: Effort normal.  Musculoskeletal: Normal range of motion.  Neurological: He is oriented to person, place, and time.  Skin: Skin is warm and dry.  Psychiatric: He has a normal mood  and affect. His behavior is normal.  Vitals reviewed.   RECENT LABS AND TESTS: BMET    Component Value Date/Time   NA 144 03/25/2018 1041   K 4.6 03/25/2018 1041   CL 102 03/25/2018 1041   CO2 18 (L) 03/25/2018 1041   GLUCOSE 108 (H) 03/25/2018 1041   GLUCOSE 158 (H) 07/12/2017 0850   BUN 12 03/25/2018 1041   CREATININE 0.97 03/25/2018 1041   CALCIUM 9.7 03/25/2018 1041   GFRNONAA 79 03/25/2018 1041   GFRAA 91 03/25/2018 1041   Lab Results  Component Value Date   HGBA1C 6.6 (H) 03/25/2018   No results found for: INSULIN CBC    Component Value Date/Time   WBC 7.6 07/12/2017 0850   RBC 5.05 07/12/2017 0850   HGB 14.3 07/12/2017 0850   HCT 45.1 07/12/2017 0850   PLT 213 07/12/2017 0850   MCV 89.3 07/12/2017 0850   MCH 28.3 07/12/2017 0850   MCHC 31.7 07/12/2017 0850   RDW 12.9 07/12/2017 0850   LYMPHSABS 2.8 07/12/2017 0850   MONOABS 0.3 07/12/2017 0850   EOSABS 0.1 07/12/2017 0850   BASOSABS 0.0 07/12/2017 0850   Iron/TIBC/Ferritin/ %Sat No results found for: IRON, TIBC, FERRITIN, IRONPCTSAT Lipid Panel     Component Value Date/Time   CHOL 170 03/25/2018 1041   TRIG 88 03/25/2018 1041   HDL 52 03/25/2018 1041   CHOLHDL 3.3 03/25/2018 1041   LDLCALC 100 (H) 03/25/2018 1041   Hepatic Function Panel     Component Value Date/Time   PROT 6.9 03/25/2018 1041   ALBUMIN 4.5 03/25/2018 1041   AST 15 03/25/2018 1041   ALT 16 03/25/2018 1041   ALKPHOS 92 03/25/2018 1041   BILITOT 0.3 03/25/2018 1041   No results found for: TSH  Results for NORVIN, OHLIN (MRN [id number]) as of 04/10/2018 10:29  Ref. Range 03/25/2018 10:41  Vitamin  D, 25-Hydroxy Latest Ref Range: 30.0 - 100.0 ng/mL 42.6    ASSESSMENT AND PLAN: Controlled type 2 diabetes mellitus with other specified complication, without long-term current use of insulin (HCC)  Essential hypertension  History of vitamin D deficiency  Class 3 severe obesity with serious comorbidity and body mass index (BMI) of 40.0 to 44.9 in adult, unspecified obesity type ([place] Blanchard)  PLAN:  Diabetes II Travis Blanchard has been given extensive diabetes education by myself today including ideal fasting and post-prandial blood glucose readings, individual ideal Hgb A1c goals and hypoglycemia prevention. We discussed the importance of good blood sugar control to decrease the likelihood of diabetic complications such as nephropathy, neuropathy, limb loss, blindness, coronary artery disease, and death. We discussed the importance of intensive lifestyle modification including diet, exercise and weight loss as the first line treatment for diabetes. Travis Blanchard agrees to continue to monitor his fasting and 2 hour postprandial blood sugars. He agrees to follow up at the agreed upon time in 2 weeks.  Hypertension We discussed sodium restriction, working on healthy weight loss, and a regular exercise program as the means to achieve improved blood pressure control. Travis Blanchard agreed with this plan and agreed to follow up as directed. We will continue to monitor his blood pressure as well as his progress with the above lifestyle modifications. He will continue his hypertensive medications as prescribed and will watch for signs of hypotension as he continues his lifestyle modifications. He will follow up as directed.  History of Vitamin D deficiency Travis Blanchard was informed that low vitamin D levels contributes to fatigue and are associated with obesity, breast,  and colon cancer. He agrees to continue to take OTC Vit D and will follow up for routine testing of vitamin D, at least 2-3 times per year. He was informed of the risk of  over-replacement of vitamin D and agrees to not increase his dose unless he discusses this with [name] first. He agrees to follow up in 2 weeks.  Obesity Travis Blanchard is currently in the action stage of change. As such, his goal is to continue with weight loss efforts. He has agreed to follow the Category 3 plan. Travis Blanchard has been instructed to work up to a goal of 150 minutes of combined cardio and strengthening exercise per week for weight loss and overall health benefits. We discussed the following Behavioral Modification Strategies today: increasing lean protein intake, decreasing simple carbohydrates, increasing vegetables, increase H2O intake, decrease eating out, no skipping meals, and work on meal planning and easy cooking plans.  Travis Blanchard has agreed to follow up with our clinic in 2 weeks. He was informed of the importance of frequent follow up visits to maximize his success with intensive lifestyle modifications for his multiple health conditions.   OBESITY BEHAVIORAL INTERVENTION VISIT  Today's visit was # 2   Starting weight: 294 lbs Starting date: 03/25/18 Today's weight : Weight: 292 lb (132.5 kg)  Today's date: 04/09/2018 Total lbs lost to date: 2 At least 15 minutes were spent on discussing the following behavioral intervention visit.   ASK: We discussed the diagnosis of obesity with Travis Blanchard today and Travis Blanchard agreed to give [name] permission to discuss obesity behavioral modification therapy today.  ASSESS: Travis Blanchard has the diagnosis of obesity and his BMI today is 41.9. Travis Blanchard is in the action stage of change.   ADVISE: Travis Blanchard was educated on the multiple health risks of obesity as well as the benefit of weight loss to improve his health. He was advised of the need for long term treatment and the importance of lifestyle modifications to improve his current health and to decrease his risk of future health problems.  AGREE: Multiple dietary modification options and treatment options were  discussed and [place] agreed to follow the recommendations documented in the above note.  ARRANGE: Travis Blanchard was educated on the importance of frequent visits to treat obesity as outlined per CMS and USPSTF guidelines and agreed to schedule his next follow up appointment today.  I, Marcille Blanco, am acting as Location manager for [employer]. Owens Shark, DO  I have reviewed the above documentation for accuracy and completeness, and I agree with the above. -Jearld Lesch, DO

## 2018-04-16 ENCOUNTER — Ambulatory Visit (INDEPENDENT_AMBULATORY_CARE_PROVIDER_SITE_OTHER): Payer: Medicare Other | Admitting: Psychology

## 2018-04-16 DIAGNOSIS — F3289 Other specified depressive episodes: Secondary | ICD-10-CM | POA: Diagnosis not present

## 2018-04-21 DIAGNOSIS — Z79899 Other long term (current) drug therapy: Secondary | ICD-10-CM | POA: Diagnosis not present

## 2018-04-21 DIAGNOSIS — Z0000 Encounter for general adult medical examination without abnormal findings: Secondary | ICD-10-CM | POA: Diagnosis not present

## 2018-04-21 DIAGNOSIS — Z1331 Encounter for screening for depression: Secondary | ICD-10-CM | POA: Diagnosis not present

## 2018-04-21 DIAGNOSIS — Z1389 Encounter for screening for other disorder: Secondary | ICD-10-CM | POA: Diagnosis not present

## 2018-04-21 DIAGNOSIS — K429 Umbilical hernia without obstruction or gangrene: Secondary | ICD-10-CM | POA: Diagnosis not present

## 2018-04-21 DIAGNOSIS — Z23 Encounter for immunization: Secondary | ICD-10-CM | POA: Diagnosis not present

## 2018-04-21 DIAGNOSIS — E782 Mixed hyperlipidemia: Secondary | ICD-10-CM | POA: Diagnosis not present

## 2018-04-21 DIAGNOSIS — I10 Essential (primary) hypertension: Secondary | ICD-10-CM | POA: Diagnosis not present

## 2018-04-21 DIAGNOSIS — E1165 Type 2 diabetes mellitus with hyperglycemia: Secondary | ICD-10-CM | POA: Diagnosis not present

## 2018-04-23 ENCOUNTER — Ambulatory Visit (INDEPENDENT_AMBULATORY_CARE_PROVIDER_SITE_OTHER): Payer: Medicare Other | Admitting: Bariatrics

## 2018-04-23 VITALS — BP 130/75 | HR 77 | Temp 98.1°F | Ht 70.0 in | Wt 292.0 lb

## 2018-04-23 DIAGNOSIS — E119 Type 2 diabetes mellitus without complications: Secondary | ICD-10-CM

## 2018-04-23 DIAGNOSIS — Z6841 Body Mass Index (BMI) 40.0 and over, adult: Secondary | ICD-10-CM

## 2018-04-23 DIAGNOSIS — I10 Essential (primary) hypertension: Secondary | ICD-10-CM | POA: Diagnosis not present

## 2018-04-23 DIAGNOSIS — E559 Vitamin D deficiency, unspecified: Secondary | ICD-10-CM | POA: Diagnosis not present

## 2018-04-28 NOTE — Progress Notes (Signed)
Office: [phone]  /  Fax: [phone]   HPI:   Chief Complaint: OBESITY Travis Blanchard is here to discuss his progress with his obesity treatment plan. He is on the Category 3 plan and is following his eating plan approximately 85 % of the time. He states he is walking 30 minutes 3 times per week. Travis Blanchard is not having any particular struggles. He is experimenting with different meats and he is eating all of his food. His hunger is controlled and he denies cravings.  His weight is 292 lb (132.5 kg) today and has not lost weight since his last visit. He has lost 2 lbs since starting treatment with [name].  Diabetes II Travis Blanchard has a diagnosis of diabetes type II. Travis Blanchard states fasting BGs range between 120 and 130 and is occasionally elevated, but he did not bring his logs. He denies any hypoglycemic episodes. His last A1c was 6.6 on 03/25/18. He is taking metformin 500mg . He has been working on intensive lifestyle modifications including diet, exercise, and weight loss to help control his blood glucose levels.  Hypertension Travis Blanchard is a 72 y.o. male with hypertension. He is working on weight loss to help control his blood pressure with the goal of decreasing his risk of heart attack and stroke. He is taking lisinopril 30mg  and his  blood pressure is currently well controlled. Travis Blanchard denies lightheadedness.  Vitamin D deficiency Travis Blanchard has a diagnosis of vitamin D deficiency. He is currently taking OTC vit D and denies nausea, vomiting or muscle weakness.  ALLERGIES: Allergies  Allergen Reactions  . Penicillins Hives, Itching and Other (See Comments)    Has patient had a PCN reaction causing immediate rash, facial/tongue/throat swelling, SOB or lightheadedness with hypotension: Yes Has patient had a PCN reaction causing severe rash involving mucus membranes or skin necrosis: No Has patient had a PCN reaction that required hospitalization: No Has patient had a PCN reaction occurring within the last 10  years: No If all of the above answers are "NO", then may proceed with Cephalosporin use.     MEDICATIONS: Current Outpatient Medications on File Prior to Visit  Medication Sig Dispense Refill  . acetaminophen (TYLENOL) 500 MG tablet Take 1,000 mg by mouth every 6 (six) hours as needed.    [name] atorvastatin (LIPITOR) 20 MG tablet Take 20 mg by mouth daily.     . Cholecalciferol (VITAMIN D3) 1000 units CAPS Take by mouth.    . fluticasone (FLONASE) 50 MCG/ACT nasal spray Place into both nostrils daily.    [name] ipratropium-albuterol (DUONEB) 0.5-2.5 (3) MG/3ML SOLN Take 3 mLs by nebulization.    [name] lisinopril (PRINIVIL,ZESTRIL) 30 MG tablet Take 30 mg by mouth daily.     [name] loratadine (CLARITIN) 10 MG tablet Take 10 mg by mouth daily.    . metFORMIN (GLUCOPHAGE) 500 MG tablet Take 500 mg by mouth 2 (two) times daily with a meal.     . naproxen sodium (ALEVE) 220 MG tablet Take 220 mg by mouth at bedtime.    . psyllium (METAMUCIL) 58.6 % powder Take 1 packet by mouth at bedtime. SUGAR FREE ORANGE FLAVORED    . VENTOLIN HFA 108 (90 BASE) MCG/ACT inhaler Inhale 1 puff into the lungs every 6 (six) hours as needed for wheezing or shortness of breath.     . vitamin B-12 (CYANOCOBALAMIN) 1000 MCG tablet Take 1,000 mcg by mouth daily.    [name] zolpidem (AMBIEN) 5 MG tablet Take 5 mg by mouth at bedtime.  No current facility-administered medications on file prior to visit.     PAST MEDICAL HISTORY: Past Medical History:  Diagnosis Date  . Arthritis   . Asthma   . B12 deficiency   . Cancer [facility])    Prostate  . COPD (chronic obstructive pulmonary disease) ([place])   . Cough   . Decreased hearing   . Diabetes mellitus without complication ([place])   . Floaters in visual field   . Frequent urination   . Hoarseness   . Hyperlipidemia   . Hypertension   . Joint pain   . Lower back pain   . Muscle pain   . Muscle stiffness   . Neck stiffness   . Right hip pain   . Right knee pain   . Shortness of  breath    with sudden awakening from sleep  . Tinnitus   . Trouble in sleeping   . Umbilical hernia   . Vitamin D deficiency   . Wheezing     PAST SURGICAL HISTORY: Past Surgical History:  Procedure Laterality Date  . COLONOSCOPY  2008   Dr. Gala Romney: 2 diminutive polyps in rectum (hyperplastic), otherwise normal  . COLONOSCOPY  2000   Per notes in epic, history of adenomas at outside facility  . COLONOSCOPY WITH PROPOFOL N/A 07/18/2017   Procedure: COLONOSCOPY WITH PROPOFOL;  Surgeon: Daneil Dolin, MD;  Location: [facility];  Service: Endoscopy;  Laterality: N/A;  8:45  . HEMORRHOID SURGERY    . KNEE ARTHROSCOPY     Left  . KNEE ARTHROSCOPY     right  . PROSTATE SURGERY  2000   Prostate removal/Cancer    SOCIAL HISTORY: Social History   Tobacco Use  . Smoking status: Former Smoker    Types: Cigarettes    Last attempt to quit: 03/11/2002    Years since quitting: 16.1  . Smokeless tobacco: Never Used  Substance Use Topics  . Alcohol use: No    Frequency: Never  . Drug use: No    FAMILY HISTORY: Family History  Problem Relation Age of Onset  . Diabetes Mother   . Hypertension Mother   . Hyperlipidemia Mother   . Stroke Mother   . Heart disease Mother   . Depression Mother   . Obesity Mother   . Eating disorder Mother   . Cancer Father   . Depression Father   . Anxiety disorder Father   . Liver disease Father   . Alcoholism Father   . Cancer Sister        Breast  . Colon cancer Neg Hx     ROS: Review of Systems  Constitutional: Negative for weight loss.  Gastrointestinal: Negative for nausea and vomiting.  Musculoskeletal:       Negative for muscle weakness.  Neurological:       Negative for lightheadedness.  Endo/Heme/Allergies:       Negative for hypoglycemia.    PHYSICAL EXAM: Blood pressure 130/75, pulse 77, temperature 98.1 F (36.7 C), temperature source Oral, height 5\' 10"  (1.778 m), weight 292 lb (132.5 kg), SpO2 98 %. Body mass index  is 41.9 kg/m. Physical Exam  Constitutional: He is oriented to person, place, and time. He appears well-developed and well-nourished.  Cardiovascular: Normal rate.  Pulmonary/Chest: Effort normal.  Musculoskeletal: Normal range of motion.  Neurological: He is oriented to person, place, and time.  Skin: Skin is warm and dry.  Psychiatric: He has a normal mood and affect. His behavior is normal.  Vitals reviewed.   RECENT LABS AND TESTS: BMET    Component Value Date/Time   NA 144 03/25/2018 1041   K 4.6 03/25/2018 1041   CL 102 03/25/2018 1041   CO2 18 (L) 03/25/2018 1041   GLUCOSE 108 (H) 03/25/2018 1041   GLUCOSE 158 (H) 07/12/2017 0850   BUN 12 03/25/2018 1041   CREATININE 0.97 03/25/2018 1041   CALCIUM 9.7 03/25/2018 1041   GFRNONAA 79 03/25/2018 1041   GFRAA 91 03/25/2018 1041   Lab Results  Component Value Date   HGBA1C 6.6 (H) 03/25/2018   No results found for: INSULIN CBC    Component Value Date/Time   WBC 7.6 07/12/2017 0850   RBC 5.05 07/12/2017 0850   HGB 14.3 07/12/2017 0850   HCT 45.1 07/12/2017 0850   PLT 213 07/12/2017 0850   MCV 89.3 07/12/2017 0850   MCH 28.3 07/12/2017 0850   MCHC 31.7 07/12/2017 0850   RDW 12.9 07/12/2017 0850   LYMPHSABS 2.8 07/12/2017 0850   MONOABS 0.3 07/12/2017 0850   EOSABS 0.1 07/12/2017 0850   BASOSABS 0.0 07/12/2017 0850   Iron/TIBC/Ferritin/ %Sat No results found for: IRON, TIBC, FERRITIN, IRONPCTSAT Lipid Panel     Component Value Date/Time   CHOL 170 03/25/2018 1041   TRIG 88 03/25/2018 1041   HDL 52 03/25/2018 1041   CHOLHDL 3.3 03/25/2018 1041   LDLCALC 100 (H) 03/25/2018 1041   Hepatic Function Panel     Component Value Date/Time   PROT 6.9 03/25/2018 1041   ALBUMIN 4.5 03/25/2018 1041   AST 15 03/25/2018 1041   ALT 16 03/25/2018 1041   ALKPHOS 92 03/25/2018 1041   BILITOT 0.3 03/25/2018 1041   No results found for: TSH   Results for Travis Blanchard, Travis Blanchard (MRN [id number]) as of 04/28/2018 16:46  Ref.  Range 03/25/2018 10:41  Vitamin D, 25-Hydroxy Latest Ref Range: 30.0 - 100.0 ng/mL 42.6   ASSESSMENT AND PLAN: Type 2 diabetes mellitus without complication, without long-term current use of insulin (HCC)  Essential hypertension  Vitamin D deficiency  Class 3 severe obesity with serious comorbidity and body mass index (BMI) of 40.0 to 44.9 in adult, unspecified obesity type ([place])  PLAN:  Diabetes II Travis Blanchard has been given extensive diabetes education by myself today including ideal fasting and post-prandial blood glucose readings, individual ideal Hgb A1c goals, and hypoglycemia prevention. We discussed the importance of good blood sugar control to decrease the likelihood of diabetic complications such as nephropathy, neuropathy, limb loss, blindness, coronary artery disease, and death. We discussed the importance of intensive lifestyle modification including diet, exercise and weight loss as the first line treatment for diabetes. Travis Blanchard agrees to continue his metformin and will follow up at the agreed upon time in 2 weeks.  Hypertension We discussed sodium restriction, working on healthy weight loss, and a regular exercise program as the means to achieve improved blood pressure control. We will continue to monitor his blood pressure as well as his progress with the above lifestyle modifications. He will continue his medications as prescribed and will watch for signs of hypotension as he continues his lifestyle modifications. He agrees to decrease his sodium content and to read labels. Travis Blanchard agreed with this plan and agreed to follow up as directed.  Vitamin D Deficiency Travis Blanchard was informed that low vitamin D levels contributes to fatigue and are associated with obesity, breast, and colon cancer. He agrees to continue to take OTC  Vit D 1,000 IU and will follow up  for routine testing of vitamin D, at least 2-3 times per year. He was informed of the risk of over-replacement of vitamin D and agrees to not  increase his dose unless he discusses this with [name] first. Travis Blanchard agrees to follow up in 2 weeks as directed.  Obesity Travis Blanchard is currently in the action stage of change. As such, his goal is to continue with weight loss efforts. He has agreed to follow the Category 3 plan and to increase water intake. Travis Blanchard has been instructed to work up to a goal of 150 minutes of combined cardio and strengthening exercise per week for weight loss and overall health benefits. We discussed the following Behavioral Modification Strategies today: increasing lean protein intake, decreasing simple carbohydrates, increasing vegetables, increase H2O intake, decreasing sodium intake, and no skipping meals.   Travis Blanchard has agreed to follow up with our clinic in 2 weeks. He was informed of the importance of frequent follow up visits to maximize his success with intensive lifestyle modifications for his multiple health conditions.   OBESITY BEHAVIORAL INTERVENTION VISIT  Today's visit was # 3   Starting weight: 294 lbs Starting date: 03/25/18 Today's weight : Weight: 292 lb (132.5 kg)  Today's date: 04/23/2018 Total lbs lost to date: 2 At least 15 minutes were spent on discussing the following behavioral intervention visit.  ASK: We discussed the diagnosis of obesity with Travis Blanchard today and Travis Blanchard agreed to give [name] permission to discuss obesity behavioral modification therapy today.  ASSESS: Travis Blanchard has the diagnosis of obesity and his BMI today is 41.9. Travis Blanchard is in the action stage of change.  ADVISE: Travis Blanchard was educated on the multiple health risks of obesity as well as the benefit of weight loss to improve his health. He was advised of the need for long term treatment and the importance of lifestyle modifications to improve his current health and to decrease his risk of future health problems.  AGREE: Multiple dietary modification options and treatment options were discussed and Travis Blanchard agreed to follow the  recommendations documented in the above note.  ARRANGE: Aydden was educated on the importance of frequent visits to treat obesity as outlined per CMS and USPSTF guidelines and agreed to schedule his next follow up appointment today.  I, Travis Blanchard, am acting as Location manager for [employer]. Travis Shark, DO   I have reviewed the above documentation for accuracy and completeness, and I agree with the above. -Travis Blanchard Lesch, DO

## 2018-04-30 ENCOUNTER — Ambulatory Visit (INDEPENDENT_AMBULATORY_CARE_PROVIDER_SITE_OTHER): Payer: Medicare Other | Admitting: Psychology

## 2018-04-30 DIAGNOSIS — F3289 Other specified depressive episodes: Secondary | ICD-10-CM

## 2018-04-30 NOTE — Progress Notes (Signed)
Office: [phone]  /  Fax: [phone]   Date: April 30, 2018   Time Seen: 4:40pm Duration: 25 minutes Provider: Glennie Blanchard, Psy.D. Type of Session: Individual Therapy   HPI: Travis Blanchard referred by Dr. Ronney Asters to depression with emotional eating behaviors and was seen for an initial appointment with this provider on April 01, 2018. Per the note for the initial visit withDr. Norville Haggard March 25, 2018,"Travis Blanchard binge eating but no formal diagnosis. Travis Blanchard struggleswith emotional eating and using food for comfort to the extent that it is negatively impacting hishealth. Heoften snacks when heis not hungry. Johnsometimes feels heis out of control and then feels guilty that hemade poor food choices. Hehas been working on behavior modification techniques to help reduce hisemotional eating and has been somewhat successful.Heshows no sign of suicidal or homicidal ideations."In addition during the initial appointment with Dr. Owens Shark, Travis Blanchard reported experiencing the following:significant food cravings issues; snackingfrequently in the evenings; skippingmeals frequently;frequently drinking liquids with calories;frequentlyeatinglarger portions than normal;binge eating behaviors; and strugglingwith emotional eating.Also, per the note for the initial visit withDr. Sherry Ruffing March 25, 2018,Travis Blanchard started gaining weight after his his prostate surgery and knee injuries. His heaviest weight ever was 315 pounds. During the initial appointment with this provider, Travis Blanchard reported, "The reason I ended up at the clinic was because I failed a couple colonoscopies." He described having a hernia and various medical appointments resulting in the suggestion of bariatric surgery. After "weighing the pros and cons," Travis Blanchard discussed he decided to try weight loss. Regarding emotional eating, Travis Blanchard indicated he tends to eat peanuts and ice cream at night while watching television. He  reported he stopped buying peanuts. Moreover, he described a reduction in eating larger portions since increased awareness of appropriate portion sizes. Since starting with the prescribed meal plan, Travis Blanchard stated, "It's going great. I feel better. 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 Johnendorsed the following: overeat when you are celebrating, eat certain foods when you are anxious, stressed, depressed, or your feelings are hurt, not worry about what you eat when you are in a good mood and eat as a reward.  Session Content: Session focused on the following treatment goal: decrease emotional eating. The session was initiated with the administration of the PHQ-9 and GAD-7, as well as a brief check-in. Travis Blanchard shared he continues to work on projects. He indicated an increase in his protein intake since the last appointment; however, discussed deviating from the prescribed meal plan. In addition, Travis Blanchard noted that he continues to consume peanuts and walnuts. This provider briefly reviewed triggers for emotional eating, but Travis Blanchard noted, "I do not have many triggers." Thus, this provider provided further psychoeducation regarding emotional hunger as well as triggers for emotional eating. Remainder of session focused on mindfulness. Psychoeducation regarding mindfulness including its benefits was provided to Travis Blanchard. He was encouraged to engage in one of the identified exercises on the handout provided between now and the next appointment. Overall, Travis Blanchard was receptive to today's appointment as evidenced by his openness to sharing, responsiveness to feedback, and willingness to practice mindfulness outside of the appointment.  Mental Status Examination: Travis Blanchard arrived on time for the appointment; however, the  appointment was initiated late due to to this provider. He presented as appropriately dressed and groomed. Travis Blanchard appeared his stated age and demonstrated adequate orientation to time, place,  person, and purpose of the appointment. He also demonstrated appropriate eye contact. No psychomotor abnormalities or behavioral peculiarities noted. His mood was euthymic with congruent affect. His thought processes were logical, linear, and goal-directed. No hallucinations, delusions, bizarre thinking or behavior reported or observed. Judgment, insight, and impulse control appeared to be grossly intact. There was no evidence of paraphasias (i.e., errors in speech, gross mispronunciations, and word substitutions), repetition deficits, or disturbances in volume or prosody (i.e., rhythm and intonation). There was no evidence of attention or memory impairments. Travis Blanchard denied current suicidal and homicidal ideation, intent or plan.  Structured Assessment Results: The Patient Health Questionnaire-9 (PHQ-9) is a self-report measure that assesses symptoms and severity of depression over the course of the last two weeks. Travis Blanchard obtained a score of one suggesting minimal depression. Travis Blanchard finds the endorsed symptoms to be not difficult at all. Depression screen Travis Blanchard 2/9 04/30/2018  Decreased Interest 0  Down, Depressed, Hopeless 0  PHQ - 2 Score 0  Altered sleeping 1  Tired, decreased energy 0  Change in appetite 0  Feeling bad or failure about yourself  0  Trouble concentrating 0  Moving slowly or fidgety/restless 0  Suicidal thoughts 0  PHQ-9 Score 1  Difficult doing work/chores -   The Generalized Anxiety Disorder-7 (GAD-7) is a brief self-report measure that assesses symptoms of anxiety over the course of the last two weeks. Travis Blanchard obtained a score of zero.   GAD 7 : Generalized Anxiety Score 04/30/2018  Nervous, Anxious, on Edge 0  Control/stop worrying 0  Worry too much - different things 0  Trouble relaxing 0    Restless 0  Easily annoyed or irritable 0  Afraid - awful might happen 0  Total GAD 7 Score 0  Anxiety Difficulty -   Interventions: Travis Blanchard was administered the PHQ-9 and GAD-7 for symptom monitoring. Content from the last session was reviewed. Throughout today's session, empathic reflections and validation were provided. Further psychoeducation regarding emotional hunger and triggers for emotional eating was provided. In addition, psychoeducation regarding mindfulness was provided.  DSM-5 Diagnosis: 311 (F32.8) Other Specified Depressive Disorder, Emotional Eating  Treatment Goal & Progress: Travis Blanchard was seen for an initial appointment with this provider on April 01, 2018 during which the following treatment goal was established: decrease emotional eating. Travis Blanchard has demonstrated progress in his goal of decreasing emotional eating as evidenced by his willingness to explore emotional hunger and triggers for emotional eating. While there continues to be episodes of what appears to be emotional eating, Travis Blanchard indicated an increase in protein intake since last appointment  Plan: Travis Blanchard continues to appear able and willing to participate as evidenced by engagement in reciprocal conversation, and asking questions for clarification as appropriate. The next appointment will be scheduled in two weeks. The next session will focus on further on mindfulness and working towards the established treatment goal.

## 2018-05-06 ENCOUNTER — Ambulatory Visit (INDEPENDENT_AMBULATORY_CARE_PROVIDER_SITE_OTHER): Payer: Medicare Other | Admitting: Bariatrics

## 2018-05-06 ENCOUNTER — Encounter (INDEPENDENT_AMBULATORY_CARE_PROVIDER_SITE_OTHER): Payer: Self-pay | Admitting: Bariatrics

## 2018-05-06 VITALS — BP 126/71 | HR 79 | Temp 97.8°F | Ht 70.0 in | Wt 288.0 lb

## 2018-05-06 DIAGNOSIS — E119 Type 2 diabetes mellitus without complications: Secondary | ICD-10-CM

## 2018-05-06 DIAGNOSIS — Z6841 Body Mass Index (BMI) 40.0 and over, adult: Secondary | ICD-10-CM | POA: Diagnosis not present

## 2018-05-06 DIAGNOSIS — I10 Essential (primary) hypertension: Secondary | ICD-10-CM | POA: Diagnosis not present

## 2018-05-06 DIAGNOSIS — E559 Vitamin D deficiency, unspecified: Secondary | ICD-10-CM | POA: Diagnosis not present

## 2018-05-07 NOTE — Progress Notes (Signed)
Office: [phone]  /  Fax: [phone]   HPI:   Chief Complaint: OBESITY Travis Blanchard is here to discuss his progress with his obesity treatment plan. He is on the Category 3 plan and is following his eating plan approximately 75 to 80 % of the time. He states he is exercising 0 minutes 0 times per week. Travis Blanchard had less struggle to get on his shoes and socks since he has had reduced hip pain. He has not had inappropriate hunger or cravings.  His weight is 288 lb (130.6 kg) today and has had a weight loss of 4 pounds over a period of 2 weeks since his last visit. He has lost 6 lbs since starting treatment with [name].  Diabetes II Travis Blanchard has a diagnosis of diabetes type II. Travis Blanchard states that his fasting blood sugars are between 98 to 126 and 2 hour post prandial blood sugars are in the 140's. His last A1c was 6.6 on 03/25/18. He is taking metformin 500mg  2 times a day with no side effects.  Vitamin D deficiency Travis Blanchard has a diagnosis of vitamin D deficiency. He is currently taking OTC vit D and his last level was 42.6 on 03/25/18. He denies nausea, vomiting or muscle weakness.  Hypertension Travis Blanchard is a 72 y.o. male with hypertension. He is working on weight loss to help control his blood pressure with the goal of decreasing his risk of heart attack and stroke. Travis Blanchard's blood pressure is currently well controlled. He is taking lisinopril 30mg  daily.  ALLERGIES: Allergies  Allergen Reactions  . Penicillins Hives, Itching and Other (See Comments)    Has patient had a PCN reaction causing immediate rash, facial/tongue/throat swelling, SOB or lightheadedness with hypotension: Yes Has patient had a PCN reaction causing severe rash involving mucus membranes or skin necrosis: No Has patient had a PCN reaction that required hospitalization: No Has patient had a PCN reaction occurring within the last 10 years: No If all of the above answers are "NO", then may proceed with Cephalosporin use.      MEDICATIONS: Current Outpatient Medications on File Prior to Visit  Medication Sig Dispense Refill  . acetaminophen (TYLENOL) 500 MG tablet Take 1,000 mg by mouth every 6 (six) hours as needed.    Travis Blanchard Kitchen atorvastatin (LIPITOR) 20 MG tablet Take 20 mg by mouth daily.     . Cholecalciferol (VITAMIN D3) 1000 units CAPS Take by mouth.    . fluticasone (FLONASE) 50 MCG/ACT nasal spray Place into both nostrils daily.    Travis Blanchard Kitchen ipratropium-albuterol (DUONEB) 0.5-2.5 (3) MG/3ML SOLN Take 3 mLs by nebulization.    Travis Blanchard Kitchen lisinopril (PRINIVIL,ZESTRIL) 30 MG tablet Take 30 mg by mouth daily.     Travis Blanchard Kitchen loratadine (CLARITIN) 10 MG tablet Take 10 mg by mouth daily.    . metFORMIN (GLUCOPHAGE) 500 MG tablet Take 500 mg by mouth 2 (two) times daily with a meal.     . naproxen sodium (ALEVE) 220 MG tablet Take 220 mg by mouth at bedtime.    . psyllium (METAMUCIL) 58.6 % powder Take 1 packet by mouth at bedtime. SUGAR FREE ORANGE FLAVORED    . VENTOLIN HFA 108 (90 BASE) MCG/ACT inhaler Inhale 1 puff into the lungs every 6 (six) hours as needed for wheezing or shortness of breath.     . vitamin B-12 (CYANOCOBALAMIN) 1000 MCG tablet Take 1,000 mcg by mouth daily.    Travis Blanchard Kitchen zolpidem (AMBIEN) 5 MG tablet Take 5 mg by mouth at bedtime.  No current facility-administered medications on file prior to visit.     PAST MEDICAL HISTORY: Past Medical History:  Diagnosis Date  . Arthritis   . Asthma   . B12 deficiency   . Cancer Travis Blanchard)    Prostate  . COPD (chronic obstructive pulmonary disease) ([place])   . Cough   . Decreased hearing   . Diabetes mellitus without complication ([place])   . Floaters in visual field   . Frequent urination   . Hoarseness   . Hyperlipidemia   . Hypertension   . Joint pain   . Lower back pain   . Muscle pain   . Muscle stiffness   . Neck stiffness   . Right hip pain   . Right knee pain   . Shortness of breath    with sudden awakening from sleep  . Tinnitus   . Trouble in sleeping   .  Umbilical hernia   . Vitamin D deficiency   . Wheezing     PAST SURGICAL HISTORY: Past Surgical History:  Procedure Laterality Date  . COLONOSCOPY  2008   Dr. Gala Romney: 2 diminutive polyps in rectum (hyperplastic), otherwise normal  . COLONOSCOPY  2000   Per notes in epic, history of adenomas at outside facility  . COLONOSCOPY WITH PROPOFOL N/A 07/18/2017   Procedure: COLONOSCOPY WITH PROPOFOL;  Surgeon: Daneil Dolin, MD;  Location: [facility];  Service: Endoscopy;  Laterality: N/A;  8:45  . HEMORRHOID SURGERY    . KNEE ARTHROSCOPY     Left  . KNEE ARTHROSCOPY     right  . PROSTATE SURGERY  2000   Prostate removal/Cancer    SOCIAL HISTORY: Social History   Tobacco Use  . Smoking status: Former Smoker    Types: Cigarettes    Last attempt to quit: 03/11/2002    Years since quitting: 16.1  . Smokeless tobacco: Never Used  Substance Use Topics  . Alcohol use: No    Frequency: Never  . Drug use: No    FAMILY HISTORY: Family History  Problem Relation Age of Onset  . Diabetes Mother   . Hypertension Mother   . Hyperlipidemia Mother   . Stroke Mother   . Heart disease Mother   . Depression Mother   . Obesity Mother   . Eating disorder Mother   . Cancer Father   . Depression Father   . Anxiety disorder Father   . Liver disease Father   . Alcoholism Father   . Cancer Sister        Breast  . Colon cancer Neg Hx     ROS: Review of Systems  Constitutional: Positive for weight loss.  Gastrointestinal: Negative for nausea and vomiting.  Musculoskeletal:       Negative for muscle weakness.    PHYSICAL EXAM: Blood pressure 126/71, pulse 79, temperature 97.8 F (36.6 C), temperature source Oral, height 5\' 10"  (1.778 m), weight 288 lb (130.6 kg), SpO2 98 %. Body mass index is 41.32 kg/m. Physical Exam  Constitutional: He is oriented to person, place, and time. He appears well-developed and well-nourished.  Cardiovascular: Normal rate.  Pulmonary/Chest: Effort  normal.  Musculoskeletal: Normal range of motion.  Neurological: He is oriented to person, place, and time.  Skin: Skin is warm and dry.  Psychiatric: He has a normal mood and affect. His behavior is normal.  Vitals reviewed.   RECENT LABS AND TESTS: BMET    Component Value Date/Time   NA 144 03/25/2018 1041  K 4.6 03/25/2018 1041   CL 102 03/25/2018 1041   CO2 18 (L) 03/25/2018 1041   GLUCOSE 108 (H) 03/25/2018 1041   GLUCOSE 158 (H) 07/12/2017 0850   BUN 12 03/25/2018 1041   CREATININE 0.97 03/25/2018 1041   CALCIUM 9.7 03/25/2018 1041   GFRNONAA 79 03/25/2018 1041   GFRAA 91 03/25/2018 1041   Lab Results  Component Value Date   HGBA1C 6.6 (H) 03/25/2018   No results found for: INSULIN CBC    Component Value Date/Time   WBC 7.6 07/12/2017 0850   RBC 5.05 07/12/2017 0850   HGB 14.3 07/12/2017 0850   HCT 45.1 07/12/2017 0850   PLT 213 07/12/2017 0850   MCV 89.3 07/12/2017 0850   MCH 28.3 07/12/2017 0850   MCHC 31.7 07/12/2017 0850   RDW 12.9 07/12/2017 0850   LYMPHSABS 2.8 07/12/2017 0850   MONOABS 0.3 07/12/2017 0850   EOSABS 0.1 07/12/2017 0850   BASOSABS 0.0 07/12/2017 0850   Iron/TIBC/Ferritin/ %Sat No results found for: IRON, TIBC, FERRITIN, IRONPCTSAT Lipid Panel     Component Value Date/Time   CHOL 170 03/25/2018 1041   TRIG 88 03/25/2018 1041   HDL 52 03/25/2018 1041   CHOLHDL 3.3 03/25/2018 1041   LDLCALC 100 (H) 03/25/2018 1041   Hepatic Function Panel     Component Value Date/Time   PROT 6.9 03/25/2018 1041   ALBUMIN 4.5 03/25/2018 1041   AST 15 03/25/2018 1041   ALT 16 03/25/2018 1041   ALKPHOS 92 03/25/2018 1041   BILITOT 0.3 03/25/2018 1041   No results found for: TSH   Results for BRAYLEE, BOSHER (MRN [id number]) as of 05/07/2018 07:39  Ref. Range 03/25/2018 10:41  Vitamin D, 25-Hydroxy Latest Ref Range: 30.0 - 100.0 ng/mL 42.6   ASSESSMENT AND PLAN: Type 2 diabetes mellitus without complication, without long-term current use of  insulin (HCC)  Vitamin D deficiency  Essential hypertension  Class 3 severe obesity with serious comorbidity and body mass index (BMI) of 40.0 to 44.9 in adult, unspecified obesity type ([place])  PLAN:  Diabetes II Travis Blanchard has been given extensive diabetes education by myself today including ideal fasting and post-prandial blood glucose readings, individual ideal Hgb A1c goals, and hypoglycemia prevention. We discussed the importance of good blood sugar control to decrease the likelihood of diabetic complications such as nephropathy, neuropathy, limb loss, blindness, coronary artery disease, and death. We discussed the importance of intensive lifestyle modification including diet, exercise and weight loss as the first line treatment for diabetes. Travis Blanchard agrees to continue his diabetes medications and will follow up at the agreed upon time in 2 weeks.  Vitamin D Deficiency Travis Blanchard was informed that low vitamin D levels contributes to fatigue and are associated with obesity, breast, and colon cancer. He agrees to continue to take OTC Vit D @1 ,000 IU every day and will follow up for routine testing of vitamin D, at least 2-3 times per year. He was informed of the risk of over-replacement of vitamin D and agrees to not increase his dose unless he discusses this with [name] first. He agrees to follow up as directed.  Hypertension We discussed sodium restriction, working on healthy weight loss, and a regular exercise program as the means to achieve improved blood pressure control. Travis Blanchard agreed with this plan and agreed to follow up as directed. We will continue to monitor his blood pressure as well as his progress with the above lifestyle modifications. He will continue his antihypertensive medications as prescribed  and will watch for signs of hypotension as he continues his lifestyle modifications. He agrees to follow up in 2 weeks.  Obesity Travis Blanchard is currently in the action stage of change. As such, his goal is to  continue with weight loss efforts. He has agreed to continue to follow the Category 3 plan. He agreed to continue to increase protein and fiber in his diet plan. Travis Blanchard has been instructed to work up to a goal of 150 minutes of combined cardio and strengthening exercise per week for weight loss and overall health benefits. We discussed the following Behavioral Modification Strategies today: increasing lean protein intake.  Travis Blanchard has agreed to follow up with our clinic in 2 weeks. He was informed of the importance of frequent follow up visits to maximize his success with intensive lifestyle modifications for his multiple health conditions.   OBESITY BEHAVIORAL INTERVENTION VISIT  Today's visit was # 4   Starting weight: 294 lbs Starting date: 03/25/18 Today's weight : Weight: 288 lb (130.6 kg)  Today's date: 05/06/2018 Total lbs lost to date: 6 At least 15 minutes were spent on discussing the following behavioral intervention visit.  ASK: We discussed the diagnosis of obesity with Travis Blanchard today and Travis Blanchard agreed to give [name] permission to discuss obesity behavioral modification therapy today.  ASSESS: Yahel has the diagnosis of obesity and his BMI today is 41.32. Taevyn is in the action stage of change.   ADVISE: Martell was educated on the multiple health risks of obesity as well as the benefit of weight loss to improve his health. He was advised of the need for long term treatment and the importance of lifestyle modifications to improve his current health and to decrease his risk of future health problems.  AGREE: Multiple dietary modification options and treatment options were discussed and Travis Blanchard agreed to follow the recommendations documented in the above note.  ARRANGE: Travis Blanchard was educated on the importance of frequent visits to treat obesity as outlined per CMS and USPSTF guidelines and agreed to schedule his next follow up appointment today.  I, Travis Blanchard, am acting as  Location manager for [employer]. Travis Shark, DO  I have reviewed the above documentation for accuracy and completeness, and I agree with the above. -Jearld Lesch, DO

## 2018-05-14 NOTE — Progress Notes (Signed)
Office: [phone]  /  Fax: [phone]   Date: May 19, 2018 Time Seen: 11:58am Duration: 32 minutes Provider: Glennie Isle, Psy.D. Type of Session: Individual Therapy   HPI: Johnwas referred by Dr. Ronney Asters to depression with emotional eating behaviorsand was seen for an initial appointment with this provider on April 01, 2018. Per the note for the initial visit withDr. Norville Haggard March 25, 2018,"Johnnotes binge eating but no formal diagnosis. Atom struggleswith emotional eating and using food for comfort to the extent that it is negatively impacting hishealth. Heoften snacks when heis not hungry. Johnsometimes feels heis out of control and then feels guilty that hemade poor food choices. Hehas been working on behavior modification techniques to help reduce hisemotional eating and has been somewhat successful.Heshows no sign of suicidal or homicidal ideations."In addition during the initial appointment with Dr. Owens Shark, Jenny Reichmann reported experiencing the following:significant food cravings issues; snackingfrequently in the evenings; skippingmeals frequently;frequently drinking liquids with calories;frequentlyeatinglarger portions than normal;binge eating behaviors; and strugglingwith emotional eating.Also, per the note for the initial visit withDr. Sherry Ruffing March 25, 2018,Tilton started gaining weight after his his prostate surgery and knee injuries. His heaviest weight ever was 315 pounds.During the initial appointment with this provider,Proctor reported, "The reason I ended up at the clinic was because I failed a couple colonoscopies." He described having a hernia and various medical appointments resulting in the suggestion of bariatric surgery. After "weighing the pros and cons," Jamone discussed he decided to try weight loss. Regarding emotional eating, Briscoe indicated he tends to eat peanuts and ice cream at night while watching television. He  reported he stopped buying peanuts.Moreover, he described a reduction in eating larger portions since increased awareness of appropriate portion sizes. Since starting with the prescribed meal plan, Stiles stated, "It's going great. I feel better. 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 Johnendorsed the following: overeat when you are celebrating, eat certain foods when you are anxious, stressed, depressed, or your feelings are hurt, not worry about what you eat when you are in a good mood and eat as a reward.  Session Content: Session focused on the following treatment goal: decrease emotional eating. The session was initiated with the administration of the PHQ-9 and GAD-7, as well as a brief check-in. Szymon reported ongoing stressors related to renovations on a property. He denied aforementioned having an impact on his eating. Amous discussed he continues to focus on consuming the recommended amount of protein and he lost four pounds at his last appointment with Dr. Owens Shark. Regarding emotional eating, he noted, "It is better than what it was when I started." He indicated that he continues to eat peanuts and acknowledged that it is usually more than the allocated snack calories. Notably, Jye stated he was unaware of how many calories he has for snacks. As such, this provider recommended he discuss this with Dr. Owens Shark during their appointment today. Tanav agreed. As it relates to peanuts, portion control was discussed. Moreover, Jaymeson reported difficulty related to consuming breakfast as indicated on his meal plan. It was recommended he discuss the aforementioned with Dr. Owens Shark as well. Regarding mindfulness, Forrest indicated that he has been practicing at night and also shared it with his wife.  Furthermore, he discussed difficulty associated with initiating sleep. As such, remainder of today's appointment focused on sleep hygiene. Cheyenne was provided with a  handout.  Overall, Travis Blanchard was receptive to today's appointment as evidenced by his openness to sharing, responsiveness to feedback, and willingness to continue practicing mindfulness and implement sleep hygiene techniques.  Mental Status Examination: Cobie arrived early for the appointment; therefore, it was initiated a couple minutes early. He presented as appropriately dressed and groomed. Lynn appeared his stated age and demonstrated adequate orientation to time, place, person, and purpose of the appointment. He also demonstrated appropriate eye contact. No psychomotor abnormalities or behavioral peculiarities noted. His mood was euthymic with congruent affect. His thought processes were logical, linear, and goal-directed. No hallucinations, delusions, bizarre thinking or behavior reported or observed. Judgment, insight, and impulse control appeared to be grossly intact. There was no evidence of paraphasias (i.e., errors in speech, gross mispronunciations, and word substitutions), repetition deficits, or disturbances in volume or prosody (i.e., rhythm and intonation). There was no evidence of attention or memory impairments. Arsen denied current suicidal and homicidal ideation, intent or plan.  Structured Assessment Results: The Patient Health Questionnaire-9 (PHQ-9) is a self-report measure that assesses symptoms and severity of depression over the course of the last two weeks. Travis Blanchard obtained a score of one suggesting minimal depression. Travis Blanchard finds the endorsed symptoms to be somewhat difficult. Depression screen PHQ 2/9 05/19/2018  Decreased Interest 0  Down, Depressed, Hopeless 0  PHQ - 2 Score 0  Altered sleeping 1  Tired, decreased energy 0  Change in appetite 0  Feeling bad or failure about yourself  0  Trouble concentrating 0  Moving  slowly or fidgety/restless 0  Suicidal thoughts 0  PHQ-9 Score 1  Difficult doing work/chores -   The Generalized Anxiety Disorder-7 (GAD-7) is a brief self-report measure that assesses symptoms of anxiety over the course of the last two weeks. Kartel obtained a score of zero. GAD 7 : Generalized Anxiety Score 05/19/2018  Nervous, Anxious, on Edge 0  Control/stop worrying 0  Worry too much - different things 0  Trouble relaxing 0  Restless 0  Easily annoyed or irritable 0  Afraid - awful might happen 0  Total GAD 7 Score 0  Anxiety Difficulty -   Interventions: Jakorian was administered the PHQ-9 and GAD-7 for symptom monitoring. Content from the last session was reviewed. Throughout today's session, empathic reflections and validation were provided. Psychoeducation regarding portion control and sleep hygiene was provided.  DSM-5 Diagnosis: 311 (F32.8) Other Specified Depressive Disorder, Emotional Eating  Treatment Goal & Progress: Jethro was seen for an initial appointment with this provider on April 01, 2018 during which the following treatment goal was established: decrease emotional eating. Travoris has demonstrated progress in his goal of decreasing emotional eating as evidenced by increased awareness of hunger patterns and triggers for emotional eating. During today's appointment, Aldric reported a decrease in emotional eating since the onset of treatment. He also reported he continues to practice mindfulness.   Plan: Alma continues to appear able and willing to participate as evidenced by engagement in reciprocal conversation, and asking questions for clarification as appropriate. Due to the Thanksgiving holiday and this provider being out of the office in the coming weeks, the next appointment will be scheduled in two to three weeks. The aforementioned was explained to Jenny Reichmann, and he verbally acknowledged understanding. The next session will focus on reviewing sleep hygiene, and working towards  the established treatment goal.

## 2018-05-19 ENCOUNTER — Encounter (INDEPENDENT_AMBULATORY_CARE_PROVIDER_SITE_OTHER): Payer: Self-pay | Admitting: Bariatrics

## 2018-05-19 ENCOUNTER — Ambulatory Visit (INDEPENDENT_AMBULATORY_CARE_PROVIDER_SITE_OTHER): Payer: Medicare Other | Admitting: Psychology

## 2018-05-19 ENCOUNTER — Ambulatory Visit (INDEPENDENT_AMBULATORY_CARE_PROVIDER_SITE_OTHER): Payer: Medicare Other | Admitting: Bariatrics

## 2018-05-19 VITALS — BP 130/75 | HR 82 | Temp 98.2°F | Ht 70.0 in | Wt 295.0 lb

## 2018-05-19 DIAGNOSIS — F3289 Other specified depressive episodes: Secondary | ICD-10-CM | POA: Diagnosis not present

## 2018-05-19 DIAGNOSIS — Z6841 Body Mass Index (BMI) 40.0 and over, adult: Secondary | ICD-10-CM

## 2018-05-19 DIAGNOSIS — I10 Essential (primary) hypertension: Secondary | ICD-10-CM | POA: Diagnosis not present

## 2018-05-19 DIAGNOSIS — E119 Type 2 diabetes mellitus without complications: Secondary | ICD-10-CM | POA: Diagnosis not present

## 2018-05-20 NOTE — Progress Notes (Signed)
Office: [phone]  /  Fax: [phone]   HPI:   Chief Complaint: OBESITY Travis Blanchard is here to discuss his progress with his obesity treatment plan. He is on the Category 3 plan and is following his eating plan approximately 75 % of the time. He states he is exercising 0 minutes 0 times per week. Travis Blanchard states that he needs to get back to what he was doing. He was walking every morning, but he has stopped. He is having less hip pain and he has gained some water weight.  His weight is 295 lb (133.8 kg) today and has gained 7 pounds since his last visit. He has lost 0 lbs since starting treatment with [name].  Diabetes II Maleek has a diagnosis of diabetes type II. Travis Blanchard is taking metformin 500 mg BID, and he states fasting BGs range between 90 and 130's. He denies polyuria, polydipsia, or hypoglycemia. Last A1c was 6.6. He has been working on intensive lifestyle modifications including diet, exercise, and weight loss to help control his blood glucose levels.  Hypertension Travis Blanchard is a 72 y.o. male with hypertension. Travis Blanchard's blood pressure is well controlled. He is taking lisinopril and he denies lightheadedness. He is working weight loss to help control his blood pressure with the goal of decreasing his risk of heart attack and stroke.   ALLERGIES: Allergies  Allergen Reactions  . Penicillins Hives, Itching and Other (See Comments)    Has patient had a PCN reaction causing immediate rash, facial/tongue/throat swelling, SOB or lightheadedness with hypotension: Yes Has patient had a PCN reaction causing severe rash involving mucus membranes or skin necrosis: No Has patient had a PCN reaction that required hospitalization: No Has patient had a PCN reaction occurring within the last 10 years: No If all of the above answers are "NO", then may proceed with Cephalosporin use.     MEDICATIONS: Current Outpatient Medications on File Prior to Visit  Medication Sig Dispense Refill  . acetaminophen  (TYLENOL) 500 MG tablet Take 1,000 mg by mouth every 6 (six) hours as needed.    [name] atorvastatin (LIPITOR) 20 MG tablet Take 20 mg by mouth daily.     . Cholecalciferol (VITAMIN D3) 1000 units CAPS Take by mouth.    . fluticasone (FLONASE) 50 MCG/ACT nasal spray Place into both nostrils daily.    [name] ipratropium-albuterol (DUONEB) 0.5-2.5 (3) MG/3ML SOLN Take 3 mLs by nebulization.    [name] lisinopril (PRINIVIL,ZESTRIL) 30 MG tablet Take 30 mg by mouth daily.     [name] loratadine (CLARITIN) 10 MG tablet Take 10 mg by mouth daily.    . metFORMIN (GLUCOPHAGE) 500 MG tablet Take 500 mg by mouth 2 (two) times daily with a meal.     . naproxen sodium (ALEVE) 220 MG tablet Take 220 mg by mouth at bedtime.    . psyllium (METAMUCIL) 58.6 % powder Take 1 packet by mouth at bedtime. SUGAR FREE ORANGE FLAVORED    . VENTOLIN HFA 108 (90 BASE) MCG/ACT inhaler Inhale 1 puff into the lungs every 6 (six) hours as needed for wheezing or shortness of breath.     . vitamin B-12 (CYANOCOBALAMIN) 1000 MCG tablet Take 1,000 mcg by mouth daily.    [name] zolpidem (AMBIEN) 5 MG tablet Take 5 mg by mouth at bedtime.      No current facility-administered medications on file prior to visit.     PAST MEDICAL HISTORY: Past Medical History:  Diagnosis Date  . Arthritis   . Asthma   .  B12 deficiency   . Cancer [facility])    Prostate  . COPD (chronic obstructive pulmonary disease) ([place])   . Cough   . Decreased hearing   . Diabetes mellitus without complication ([place])   . Floaters in visual field   . Frequent urination   . Hoarseness   . Hyperlipidemia   . Hypertension   . Joint pain   . Lower back pain   . Muscle pain   . Muscle stiffness   . Neck stiffness   . Right hip pain   . Right knee pain   . Shortness of breath    with sudden awakening from sleep  . Tinnitus   . Trouble in sleeping   . Umbilical hernia   . Vitamin D deficiency   . Wheezing     PAST SURGICAL HISTORY: Past Surgical History:  Procedure Laterality  Date  . COLONOSCOPY  2008   Dr. Gala Romney: 2 diminutive polyps in rectum (hyperplastic), otherwise normal  . COLONOSCOPY  2000   Per notes in epic, history of adenomas at outside facility  . COLONOSCOPY WITH PROPOFOL N/A 07/18/2017   Procedure: COLONOSCOPY WITH PROPOFOL;  Surgeon: Daneil Dolin, MD;  Location: [facility];  Service: Endoscopy;  Laterality: N/A;  8:45  . HEMORRHOID SURGERY    . KNEE ARTHROSCOPY     Left  . KNEE ARTHROSCOPY     right  . PROSTATE SURGERY  2000   Prostate removal/Cancer    SOCIAL HISTORY: Social History   Tobacco Use  . Smoking status: Former Smoker    Types: Cigarettes    Last attempt to quit: 03/11/2002    Years since quitting: 16.2  . Smokeless tobacco: Never Used  Substance Use Topics  . Alcohol use: No    Frequency: Never  . Drug use: No    FAMILY HISTORY: Family History  Problem Relation Age of Onset  . Diabetes Mother   . Hypertension Mother   . Hyperlipidemia Mother   . Stroke Mother   . Heart disease Mother   . Depression Mother   . Obesity Mother   . Eating disorder Mother   . Cancer Father   . Depression Father   . Anxiety disorder Father   . Liver disease Father   . Alcoholism Father   . Cancer Sister        Breast  . Colon cancer Neg Hx     ROS: Review of Systems  Constitutional: Negative for weight loss.  Genitourinary: Negative for frequency.  Neurological:       Negative lightheadedness  Endo/Heme/Allergies: Negative for polydipsia.       Negative hypoglycemia    PHYSICAL EXAM: Blood pressure 130/75, pulse 82, temperature 98.2 F (36.8 C), temperature source Oral, height 5\' 10"  (1.778 m), weight 295 lb (133.8 kg), SpO2 98 %. Body mass index is 42.33 kg/m. Physical Exam  Constitutional: He is oriented to person, place, and time. He appears well-developed and well-nourished.  Cardiovascular: Normal rate.  Pulmonary/Chest: Effort normal.  Musculoskeletal: Normal range of motion.  Neurological: He is  oriented to person, place, and time.  Skin: Skin is warm and dry.  Psychiatric: He has a normal mood and affect. His behavior is normal.  Vitals reviewed.   RECENT LABS AND TESTS: BMET    Component Value Date/Time   NA 144 03/25/2018 1041   K 4.6 03/25/2018 1041   CL 102 03/25/2018 1041   CO2 18 (L) 03/25/2018 1041   GLUCOSE 108 (H)  03/25/2018 1041   GLUCOSE 158 (H) 07/12/2017 0850   BUN 12 03/25/2018 1041   CREATININE 0.97 03/25/2018 1041   CALCIUM 9.7 03/25/2018 1041   GFRNONAA 79 03/25/2018 1041   GFRAA 91 03/25/2018 1041   Lab Results  Component Value Date   HGBA1C 6.6 (H) 03/25/2018   No results found for: INSULIN CBC    Component Value Date/Time   WBC 7.6 07/12/2017 0850   RBC 5.05 07/12/2017 0850   HGB 14.3 07/12/2017 0850   HCT 45.1 07/12/2017 0850   PLT 213 07/12/2017 0850   MCV 89.3 07/12/2017 0850   MCH 28.3 07/12/2017 0850   MCHC 31.7 07/12/2017 0850   RDW 12.9 07/12/2017 0850   LYMPHSABS 2.8 07/12/2017 0850   MONOABS 0.3 07/12/2017 0850   EOSABS 0.1 07/12/2017 0850   BASOSABS 0.0 07/12/2017 0850   Iron/TIBC/Ferritin/ %Sat No results found for: IRON, TIBC, FERRITIN, IRONPCTSAT Lipid Panel     Component Value Date/Time   CHOL 170 03/25/2018 1041   TRIG 88 03/25/2018 1041   HDL 52 03/25/2018 1041   CHOLHDL 3.3 03/25/2018 1041   LDLCALC 100 (H) 03/25/2018 1041   Hepatic Function Panel     Component Value Date/Time   PROT 6.9 03/25/2018 1041   ALBUMIN 4.5 03/25/2018 1041   AST 15 03/25/2018 1041   ALT 16 03/25/2018 1041   ALKPHOS 92 03/25/2018 1041   BILITOT 0.3 03/25/2018 1041   No results found for: TSH  ASSESSMENT AND PLAN: Type 2 diabetes mellitus without complication, without long-term current use of insulin (HCC)  Essential hypertension  Other depression - with emotional eating  Class 3 severe obesity with serious comorbidity and body mass index (BMI) of 40.0 to 44.9 in adult, unspecified obesity type ([place])  PLAN:  Diabetes  II Kane has been given extensive diabetes education by myself today including ideal fasting and post-prandial blood glucose readings, individual ideal Hgb A1c goals and hypoglycemia prevention. We discussed the importance of good blood sugar control to decrease the likelihood of diabetic complications such as nephropathy, neuropathy, limb loss, blindness, coronary artery disease, and death. We discussed the importance of intensive lifestyle modification including diet, exercise and weight loss as the first line treatment for diabetes. Travis Blanchard agrees to continue taking metformin and he agrees to follow up with our clinic in 3 weeks.  Hypertension We discussed sodium restriction, working on healthy weight loss, and a regular exercise program as the means to achieve improved blood pressure control. Travis Blanchard agreed with this plan and agreed to follow up as directed. We will continue to monitor his blood pressure as well as his progress with the above lifestyle modifications. Travis Blanchard agrees to continue taking lisinopril and will watch for signs of hypotension as he continues his lifestyle modifications. Travis Blanchard agrees to follow up with our clinic in 3 weeks.  Obesity Travis Blanchard is currently in the action stage of change. As such, his goal is to continue with weight loss efforts He has agreed to follow the Category 3 plan Travis Blanchard has been instructed to work up to a goal of 150 minutes of combined cardio and strengthening exercise per week for weight loss and overall health benefits. We discussed the following Behavioral Modification Strategies today: increasing lean protein intake, decreasing simple carbohydrates, increasing vegetables, decreasing sodium intake, decrease eating out, work on meal planning and easy cooking plans, increase H20 intake, no skipping meals, keeping healthy foods in the home, and better snacking choices Travis Blanchard is to decrease sodium intake, (decrease peanuts),  no added salt, and rinse canned foods.  Travis Blanchard  has agreed to follow up with our clinic in 3 weeks. He was informed of the importance of frequent follow up visits to maximize his success with intensive lifestyle modifications for his multiple health conditions.   OBESITY BEHAVIORAL INTERVENTION VISIT  Today's visit was # 5   Starting weight: 294 lbs Starting date: 03/25/18 Today's weight : 295 lbs  Today's date: 05/19/2018 Total lbs lost to date: 0 At least 15 minutes were spent on discussing the following behavioral intervention visit.   ASK: We discussed the diagnosis of obesity with Travis Blanchard today and Travis Blanchard agreed to give [name] permission to discuss obesity behavioral modification therapy today.  ASSESS: Travis Blanchard has the diagnosis of obesity and his BMI today is 42.33 Travis Blanchard is in the action stage of change   ADVISE: Travis Blanchard was educated on the multiple health risks of obesity as well as the benefit of weight loss to improve his health. He was advised of the need for long term treatment and the importance of lifestyle modifications to improve his current health and to decrease his risk of future health problems.  AGREE: Multiple dietary modification options and treatment options were discussed and  Travis Blanchard agreed to follow the recommendations documented in the above note.  ARRANGE: Travis Blanchard was educated on the importance of frequent visits to treat obesity as outlined per CMS and USPSTF guidelines and agreed to schedule his next follow up appointment today.  Travis Blanchard, am acting as transcriptionist for [employer], DO  I have reviewed the above documentation for accuracy and completeness, and I agree with the above. -Jearld Lesch, DO

## 2018-05-28 ENCOUNTER — Other Ambulatory Visit: Payer: Self-pay

## 2018-06-02 NOTE — Progress Notes (Signed)
Office: [phone]  /  Fax: [phone]   Date: June 10, 2018 Time Seen: 12:00pm Duration: 30 minutes Provider: Glennie Isle, Psy.D. Type of Session: Individual Therapy   HPI: Johnwas referred by Dr. Ronney Asters to depression with emotional eating behaviorsand was seen for an initial appointment with this provider on April 01, 2018. Per the note for the initial visit withDr. Norville Haggard March 25, 2018,"Johnnotes binge eating but no formal diagnosis. Ralphael struggleswith emotional eating and using food for comfort to the extent that it is negatively impacting hishealth. Heoften snacks when heis not hungry. Johnsometimes feels heis out of control and then feels guilty that hemade poor food choices. Hehas been working on behavior modification techniques to help reduce hisemotional eating and has been somewhat successful.Heshows no sign of suicidal or homicidal ideations."In addition during the initial appointment with Dr. Owens Shark, Jenny Reichmann reported experiencing the following:significant food cravings issues; snackingfrequently in the evenings; skippingmeals frequently;frequently drinking liquids with calories;frequentlyeatinglarger portions than normal;binge eating behaviors; and strugglingwith emotional eating.Also, per the note for the initial visit withDr. Sherry Ruffing March 25, 2018,Rozell started gaining weight after his his prostate surgery and knee injuries. His heaviest weight ever was 315 pounds.During the initial appointment with this provider,Habib reported, "The reason I ended up at the clinic was because I failed a couple colonoscopies." He described having a hernia and various medical appointments resulting in the suggestion of bariatric surgery. After "weighing the pros and cons," Savino discussed he decided to try weight loss. Regarding emotional eating, Casin indicated he tends to eat peanuts and ice cream at night while watching television. He reported  he stopped buying peanuts.Moreover, he described a reduction in eating larger portions since increased awareness of appropriate portion sizes. Since starting with the prescribed meal plan, Tricia stated, "It's going great. I feel better. 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 Johnendorsed the following: overeat when you are celebrating, eat certain foods when you are anxious, stressed, depressed, or your feelings are hurt, not worry about what you eat when you are in a good mood and eat as a reward.  Session Content: Session focused on the following treatment goal: decrease emotional eating. The session was initiated with the administration of the PHQ-9 and GAD-7, as well as a brief check-in. Lycan discussed "pre-planning" for Thanksgiving, which he described as being helpful. While Cadell previously gained seven pounds, he noted he lost a  "couple pounds" at his weigh in today during his appointment with Dr. Owens Shark. While he increased physical activity, he noted it "activated" his COPD. As such, he will be following up with his doctor for further testing. Session then focused on the consumption of peanuts and other nuts, as they are high in calories. To assist with further options with the prescribed meal plan and alternatives to nuts, this provider recommended Roye meet with the clinic's dietitian. Delvin reported receptiveness to scheduling an appointment; therefore, this provider will coordinate with Dr. Owens Shark. Remainder of session focused on mindfulness. Edouard was provided with a handout with the hunger and satisfaction scale. Psychoeducation regarding the utilization of the scale as part of his mindfulness practice was provided.  Overall, Pinchus was receptive to today's up session as evidenced by her  openness to sharing, responsiveness to feedback and willingness to continue practicing mindfulness.  Mental Status Examination: Adonay arrived on time for the appointment. He presented  as appropriately dressed and groomed. Jahlani appeared his stated age and demonstrated adequate orientation to time, place, person, and purpose of the appointment. He also demonstrated appropriate eye contact. No psychomotor abnormalities or behavioral peculiarities noted. His mood was euthymic with congruent affect. His thought processes were logical, linear, and goal-directed. No hallucinations, delusions, bizarre thinking or behavior reported or observed. Judgment, insight, and impulse control appeared to be grossly intact. There was no evidence of paraphasias (i.e., errors in speech, gross mispronunciations, and word substitutions), repetition deficits, or disturbances in volume or prosody (i.e., rhythm and intonation). There was no evidence of attention or memory impairments. Treyon denied current suicidal and homicidal ideation, intent or plan.  Structured Assessment Results: The Patient Health Questionnaire-9 (PHQ-9) is a self-report measure that assesses symptoms and severity of depression over the course of the last two weeks. Adael obtained a score of 1 suggesting minimal depression. Alcides finds the endorsed symptoms to be not difficult at all. Depression screen [facility] 2/9 06/10/2018  Decreased Interest 0  Down, Depressed, Hopeless 0  PHQ - 2 Score 0  Altered sleeping 1  Tired, decreased energy 0  Change in appetite 0  Feeling bad or failure about yourself  0  Trouble concentrating 0  Moving slowly or fidgety/restless 0  Suicidal thoughts 0  PHQ-9 Score 1  Difficult doing work/chores -   The Generalized Anxiety Disorder-7 (GAD-7) is a brief self-report measure that assesses symptoms of anxiety over the course of the last two weeks. Mikai obtained a score of zero.  GAD 7 : Generalized Anxiety Score 06/10/2018  Nervous,  Anxious, on Edge 0  Control/stop worrying 0  Worry too much - different things 0  Trouble relaxing 0  Restless 0  Easily annoyed or irritable 0  Afraid - awful might happen 0  Total GAD 7 Score 0  Anxiety Difficulty -   Interventions: Marwin was administered the PHQ-9 and GAD-7 for symptom monitoring. Content from the last session was reviewed. Throughout today's session, empathic reflections and validation were provided. Further psychoeducation regarding mindfulness was provided, including the hunger and satisfaction scale.   DSM-5 Diagnosis: 311 (F32.8) Other Specified Depressive Disorder, Emotional Eating  Treatment Goal & Progress: Ronnel was seen for an initial appointment with this provider on April 01, 2018 during which the following treatment goal was established: decrease emotional eating. Maddock has demonstrated progress in his goal of decreasing emotional eating as evidenced by increased awareness of hunger patterns and triggers for emotional eating. During today's appointment, Susano reported receptiveness to meeting with the clinic's dietician.   Plan: Damain continues to appear able and willing to participate as evidenced by engagement in reciprocal conversation, and asking questions for clarification as appropriate. The next appointment will be scheduled in two weeks. The next session will focus on reviewing learned skills, and termination planning.

## 2018-06-10 ENCOUNTER — Ambulatory Visit (INDEPENDENT_AMBULATORY_CARE_PROVIDER_SITE_OTHER): Payer: Medicare Other | Admitting: Psychology

## 2018-06-10 ENCOUNTER — Encounter (INDEPENDENT_AMBULATORY_CARE_PROVIDER_SITE_OTHER): Payer: Self-pay | Admitting: Bariatrics

## 2018-06-10 ENCOUNTER — Ambulatory Visit (INDEPENDENT_AMBULATORY_CARE_PROVIDER_SITE_OTHER): Payer: Medicare Other | Admitting: Bariatrics

## 2018-06-10 VITALS — BP 129/70 | HR 94 | Temp 98.5°F | Ht 70.0 in | Wt 293.0 lb

## 2018-06-10 DIAGNOSIS — F3289 Other specified depressive episodes: Secondary | ICD-10-CM

## 2018-06-10 DIAGNOSIS — Z6841 Body Mass Index (BMI) 40.0 and over, adult: Secondary | ICD-10-CM

## 2018-06-10 DIAGNOSIS — E7849 Other hyperlipidemia: Secondary | ICD-10-CM | POA: Diagnosis not present

## 2018-06-10 DIAGNOSIS — E119 Type 2 diabetes mellitus without complications: Secondary | ICD-10-CM

## 2018-06-12 NOTE — Progress Notes (Signed)
Office: [phone]  /  Fax: [phone]   HPI:   Chief Complaint: OBESITY Travis Blanchard is here to discuss his progress with his obesity treatment plan. He is on the Category 3 plan and is following his eating plan approximately 50 % of the time. He states he is exercising 0 minutes 0 times per week. Travis Blanchard did not struggle during the holidays. He states that he has stayed on calories and he has increased nuts. He denies cravings.  His weight is 293 lb (132.9 kg) today and has had a weight loss of 2 pounds over a period of 3 weeks since his last visit. He has lost 1 lb since starting treatment with [name].  Diabetes II Travis Blanchard has a diagnosis of diabetes type II. Travis Blanchard is taking metformin. He states fasting BGs range between 80 and 150 (average 120), and 2 hour post prandial range in 130's. He denies hypoglycemia. Last A1c was 6.6.    He has been working on intensive lifestyle modifications including diet, exercise, and weight loss to help control his blood glucose levels.  Hyperlipidemia Travis Blanchard has hyperlipidemia and has been trying to improve his cholesterol levels with intensive lifestyle modification including a low saturated fat diet, exercise and weight loss. He is taking atorvastatin, reasonably well controlled but LDL is still 100 at last labs. He denies any chest pain, claudication or myalgias.  ALLERGIES: Allergies  Allergen Reactions  . Penicillins Hives, Itching and Other (See Comments)    Has patient had a PCN reaction causing immediate rash, facial/tongue/throat swelling, SOB or lightheadedness with hypotension: Yes Has patient had a PCN reaction causing severe rash involving mucus membranes or skin necrosis: No Has patient had a PCN reaction that required hospitalization: No Has patient had a PCN reaction occurring within the last 10 years: No If all of the above answers are "NO", then may proceed with Cephalosporin use.     MEDICATIONS: Current Outpatient Medications on File Prior to  Visit  Medication Sig Dispense Refill  . acetaminophen (TYLENOL) 500 MG tablet Take 1,000 mg by mouth every 6 (six) hours as needed.    [name] atorvastatin (LIPITOR) 20 MG tablet Take 20 mg by mouth daily.     . Cholecalciferol (VITAMIN D3) 1000 units CAPS Take by mouth.    . fluticasone (FLONASE) 50 MCG/ACT nasal spray Place into both nostrils daily.    [name] ipratropium-albuterol (DUONEB) 0.5-2.5 (3) MG/3ML SOLN Take 3 mLs by nebulization.    [name] lisinopril (PRINIVIL,ZESTRIL) 30 MG tablet Take 30 mg by mouth daily.     [name] loratadine (CLARITIN) 10 MG tablet Take 10 mg by mouth daily.    . metFORMIN (GLUCOPHAGE) 500 MG tablet Take 500 mg by mouth 2 (two) times daily with a meal.     . naproxen sodium (ALEVE) 220 MG tablet Take 220 mg by mouth at bedtime.    . psyllium (METAMUCIL) 58.6 % powder Take 1 packet by mouth at bedtime. SUGAR FREE ORANGE FLAVORED    . VENTOLIN HFA 108 (90 BASE) MCG/ACT inhaler Inhale 1 puff into the lungs every 6 (six) hours as needed for wheezing or shortness of breath.     . vitamin B-12 (CYANOCOBALAMIN) 1000 MCG tablet Take 1,000 mcg by mouth daily.    [name] zolpidem (AMBIEN) 5 MG tablet Take 5 mg by mouth at bedtime.      No current facility-administered medications on file prior to visit.     PAST MEDICAL HISTORY: Past Medical History:  Diagnosis Date  .  Arthritis   . Asthma   . B12 deficiency   . Cancer [facility])    Prostate  . COPD (chronic obstructive pulmonary disease) ([place])   . Cough   . Decreased hearing   . Diabetes mellitus without complication ([place])   . Floaters in visual field   . Frequent urination   . Hoarseness   . Hyperlipidemia   . Hypertension   . Joint pain   . Lower back pain   . Muscle pain   . Muscle stiffness   . Neck stiffness   . Right hip pain   . Right knee pain   . Shortness of breath    with sudden awakening from sleep  . Tinnitus   . Trouble in sleeping   . Umbilical hernia   . Vitamin D deficiency   . Wheezing     PAST  SURGICAL HISTORY: Past Surgical History:  Procedure Laterality Date  . COLONOSCOPY  2008   Dr. Gala Romney: 2 diminutive polyps in rectum (hyperplastic), otherwise normal  . COLONOSCOPY  2000   Per notes in epic, history of adenomas at outside facility  . COLONOSCOPY WITH PROPOFOL N/A 07/18/2017   Procedure: COLONOSCOPY WITH PROPOFOL;  Surgeon: Daneil Dolin, MD;  Location[facility];  Service: Endoscopy;  Laterality: N/A;  8:45  . HEMORRHOID SURGERY    . KNEE ARTHROSCOPY     Left  . KNEE ARTHROSCOPY     right  . PROSTATE SURGERY  2000   Prostate removal/Cancer    SOCIAL HISTORY: Social History   Tobacco Use  . Smoking status: Former Smoker    Types: Cigarettes    Last attempt to quit: 03/11/2002    Years since quitting: 16.2  . Smokeless tobacco: Never Used  Substance Use Topics  . Alcohol use: No    Frequency: Never  . Drug use: No    FAMILY HISTORY: Family History  Problem Relation Age of Onset  . Diabetes Mother   . Hypertension Mother   . Hyperlipidemia Mother   . Stroke Mother   . Heart disease Mother   . Depression Mother   . Obesity Mother   . Eating disorder Mother   . Cancer Father   . Depression Father   . Anxiety disorder Father   . Liver disease Father   . Alcoholism Father   . Cancer Sister        Breast  . Colon cancer Neg Hx     ROS: Review of Systems  Constitutional: Positive for weight loss.  Cardiovascular: Negative for chest pain and claudication.  Musculoskeletal: Negative for myalgias.  Endo/Heme/Allergies:       Negative hypoglycemia    PHYSICAL EXAM: Blood pressure 129/70, pulse 94, temperature 98.5 F (36.9 C), temperature source Oral, height 5\' 10"  (1.778 m), SpO2 99 %. Body mass index is 42.33 kg/m. Physical Exam  Constitutional: He is oriented to person, place, and time. He appears well-developed and well-nourished.  Cardiovascular: Normal rate.  Pulmonary/Chest: Effort normal.  Musculoskeletal: Normal range of motion.    Neurological: He is oriented to person, place, and time.  Skin: Skin is warm and dry.  Psychiatric: He has a normal mood and affect. His behavior is normal.  Vitals reviewed.   RECENT LABS AND TESTS: BMET    Component Value Date/Time   NA 144 03/25/2018 1041   K 4.6 03/25/2018 1041   CL 102 03/25/2018 1041   CO2 18 (L) 03/25/2018 1041   GLUCOSE 108 (H) 03/25/2018  1041   GLUCOSE 158 (H) 07/12/2017 0850   BUN 12 03/25/2018 1041   CREATININE 0.97 03/25/2018 1041   CALCIUM 9.7 03/25/2018 1041   GFRNONAA 79 03/25/2018 1041   GFRAA 91 03/25/2018 1041   Lab Results  Component Value Date   HGBA1C 6.6 (H) 03/25/2018   No results found for: INSULIN CBC    Component Value Date/Time   WBC 7.6 07/12/2017 0850   RBC 5.05 07/12/2017 0850   HGB 14.3 07/12/2017 0850   HCT 45.1 07/12/2017 0850   PLT 213 07/12/2017 0850   MCV 89.3 07/12/2017 0850   MCH 28.3 07/12/2017 0850   MCHC 31.7 07/12/2017 0850   RDW 12.9 07/12/2017 0850   LYMPHSABS 2.8 07/12/2017 0850   MONOABS 0.3 07/12/2017 0850   EOSABS 0.1 07/12/2017 0850   BASOSABS 0.0 07/12/2017 0850   Iron/TIBC/Ferritin/ %Sat No results found for: IRON, TIBC, FERRITIN, IRONPCTSAT Lipid Panel     Component Value Date/Time   CHOL 170 03/25/2018 1041   TRIG 88 03/25/2018 1041   HDL 52 03/25/2018 1041   CHOLHDL 3.3 03/25/2018 1041   LDLCALC 100 (H) 03/25/2018 1041   Hepatic Function Panel     Component Value Date/Time   PROT 6.9 03/25/2018 1041   ALBUMIN 4.5 03/25/2018 1041   AST 15 03/25/2018 1041   ALT 16 03/25/2018 1041   ALKPHOS 92 03/25/2018 1041   BILITOT 0.3 03/25/2018 1041   No results found for: TSH  ASSESSMENT AND PLAN: Type 2 diabetes mellitus without complication, without long-term current use of insulin (HCC)  Other hyperlipidemia  Class 3 severe obesity with serious comorbidity and body mass index (BMI) of 40.0 to 44.9 in adult, unspecified obesity type ([place])  PLAN:  Diabetes II Travis Blanchard has been given  extensive diabetes education by myself today including ideal fasting and post-prandial blood glucose readings, individual ideal Hgb A1c goals and hypoglycemia prevention. We discussed the importance of good blood sugar control to decrease the likelihood of diabetic complications such as nephropathy, neuropathy, limb loss, blindness, coronary artery disease, and death. We discussed the importance of intensive lifestyle modification including diet, exercise and weight loss as the first line treatment for diabetes. Travis Blanchard agrees to continue his diabetes medications and we will recheck labs at next visit. Travis Blanchard agrees to follow up with our clinic in 2 weeks.  Hyperlipidemia Travis Blanchard was informed of the American Heart Association Guidelines emphasizing intensive lifestyle modifications as the first line treatment for hyperlipidemia. We discussed many lifestyle modifications today in depth, and Travis Blanchard will continue to work on decreasing saturated fats such as fatty red meat, butter and many fried foods. Travis Blanchard agrees to continue his medications, and he will also increase vegetables and lean protein in his diet and continue to work on exercise and weight loss efforts. We will recheck labs at next visit. Travis Blanchard agrees to follow up with our clinic in 2 weeks.  Obesity Travis Blanchard is currently in the action stage of change. As such, his goal is to continue with weight loss efforts He has agreed to follow the Category 3 plan Travis Blanchard has been instructed to work up to a goal of 150 minutes of combined cardio and strengthening exercise per week for weight loss and overall health benefits. We discussed the following Behavioral Modification Strategies today: increasing lean protein intake, decreasing simple carbohydrates, increasing vegetables, work on meal planning and easy cooking plans, increase H20 intake, planning for success, and decrease junk food   Travis Blanchard has agreed to follow up with  our clinic in 2 weeks. He was informed of the  importance of frequent follow up visits to maximize his success with intensive lifestyle modifications for his multiple health conditions.   OBESITY BEHAVIORAL INTERVENTION VISIT  Today's visit was # 6   Starting weight: 294 lbs Starting date: 03/25/18 Today's weight : 293 lbs   Today's date: 06/10/2018 Total lbs lost to date: 1 At least 15 minutes were spent on discussing the following behavioral intervention visit.   ASK: We discussed the diagnosis of obesity with Travis Blanchard today and Travis Blanchard agreed to give [name] permission to discuss obesity behavioral modification therapy today.  ASSESS: Travis Blanchard has the diagnosis of obesity and his BMI today is 42.04 Travis Blanchard is in the action stage of change   ADVISE: Travis Blanchard was educated on the multiple health risks of obesity as well as the benefit of weight loss to improve his health. He was advised of the need for long term treatment and the importance of lifestyle modifications to improve his current health and to decrease his risk of future health problems.  AGREE: Multiple dietary modification options and treatment options were discussed and  Travis Blanchard agreed to follow the recommendations documented in the above note.  ARRANGE: Travis Blanchard was educated on the importance of frequent visits to treat obesity as outlined per CMS and USPSTF guidelines and agreed to schedule his next follow up appointment today.  Wilhemena Durie, am acting as transcriptionist for [employer], DO  I have reviewed the above documentation for accuracy and completeness, and I agree with the above. -Jearld Lesch, DO

## 2018-06-24 ENCOUNTER — Ambulatory Visit (INDEPENDENT_AMBULATORY_CARE_PROVIDER_SITE_OTHER): Payer: Medicare Other | Admitting: Bariatrics

## 2018-06-24 ENCOUNTER — Encounter (INDEPENDENT_AMBULATORY_CARE_PROVIDER_SITE_OTHER): Payer: Self-pay | Admitting: Bariatrics

## 2018-06-24 VITALS — BP 144/76 | HR 83 | Temp 97.9°F | Ht 70.0 in | Wt 294.0 lb

## 2018-06-24 DIAGNOSIS — E7849 Other hyperlipidemia: Secondary | ICD-10-CM | POA: Diagnosis not present

## 2018-06-24 DIAGNOSIS — Z6841 Body Mass Index (BMI) 40.0 and over, adult: Secondary | ICD-10-CM

## 2018-06-24 DIAGNOSIS — E119 Type 2 diabetes mellitus without complications: Secondary | ICD-10-CM | POA: Diagnosis not present

## 2018-06-24 NOTE — Progress Notes (Signed)
Office: [phone]  /  Fax: [phone]   HPI:   Chief Complaint: OBESITY Travis Blanchard is here to discuss his progress with his obesity treatment plan. He is on the Category 3 plan and is following his eating plan approximately 60 % of the time. He states he is doing air steps 5 minutes 3 to 4 times per day 2 times per week. Travis Blanchard is still struggling with the Category 3 plan ( too many calories overall, and eating excessive nuts ).  His weight is 294 lb (133.4 kg) today and has had a weight gain of 1 pound over a period of 2 weeks since his last visit. He has lost 0 lbs since starting treatment with [name].  Diabetes II Travis Blanchard has a diagnosis of diabetes type II. Travis Blanchard states fasting BGs range between 120 and 160 and 2 hour post prandial BGs range between 120 and 195 and he denies any hypoglycemic episodes. Last A1c was at 6.6 He has been working on intensive lifestyle modifications including diet, exercise, and weight loss to help control his blood glucose levels.  Hyperlipidemia Travis Blanchard has hyperlipidemia and he is currently taking atorvastatin. He has been trying to improve his cholesterol levels with intensive lifestyle modification including a low saturated fat diet, exercise and weight loss. He denies any myalgias.  ASSESSMENT AND PLAN:  Type 2 diabetes mellitus without complication, without long-term current use of insulin (Travis Blanchard)  Other hyperlipidemia  Class 3 severe obesity with serious comorbidity and body mass index (BMI) of 40.0 to 44.9 in adult, unspecified obesity type (Travis Blanchard)  PLAN:  Diabetes II Travis Blanchard has been given extensive diabetes education by myself today including ideal fasting and post-prandial blood glucose readings, individual ideal Hgb A1c goals and hypoglycemia prevention. We discussed the importance of good blood sugar control to decrease the likelihood of diabetic complications such as nephropathy, neuropathy, limb loss, blindness, coronary artery disease, and death. We  discussed the importance of intensive lifestyle modification including diet, exercise and weight loss as the first line treatment for diabetes. Travis Blanchard will continue metformin and will work on decreasing carbohydrates in his diet. Travis Blanchard agreed to follow up at the agreed upon time.  Hyperlipidemia Travis Blanchard was informed of the American Heart Association Guidelines emphasizing intensive lifestyle modifications as the first line treatment for hyperlipidemia. We discussed many lifestyle modifications today in depth, and Travis Blanchard will continue to work on decreasing saturated fats such as fatty red meat, butter and many fried foods. He will also increase vegetables and lean protein in his diet and continue to work on exercise and weight loss efforts. Travis Blanchard will continue his medications as prescribed.  Obesity Travis Blanchard is currently in the action stage of change. As such, his goal is to continue with weight loss efforts He has agreed to continue on the same Category 3 plan Travis Blanchard will continue with his step machine for 5 minutes 3 to 4 times per day, 2 times per week for weight loss and overall health benefits. We discussed the following Behavioral Modification Strategies today: increase H2O intake, decrease nuts (too high in calories), smart fruit choices, increasing lean protein intake, decreasing simple carbohydrates, increasing vegetables and decrease liquid calories  Travis Blanchard has agreed to follow up with our clinic in 4 weeks and with our registered dietician in 2 weeks. He was informed of the importance of frequent follow up visits to maximize his success with intensive lifestyle modifications for his multiple health conditions.  ALLERGIES: Allergies  Allergen Reactions  . Penicillins Hives, Itching and  Other (See Comments)    Has patient had a PCN reaction causing immediate rash, facial/tongue/throat swelling, SOB or lightheadedness with hypotension: Yes Has patient had a PCN reaction causing severe rash involving mucus  membranes or skin necrosis: No Has patient had a PCN reaction that required hospitalization: No Has patient had a PCN reaction occurring within the last 10 years: No If all of the above answers are "NO", then may proceed with Cephalosporin use.     MEDICATIONS: Current Outpatient Medications on File Prior to Visit  Medication Sig Dispense Refill  . acetaminophen (TYLENOL) 500 MG tablet Take 1,000 mg by mouth every 6 (six) hours as needed.    [name] atorvastatin (LIPITOR) 20 MG tablet Take 20 mg by mouth daily.     . Cholecalciferol (VITAMIN D3) 1000 units CAPS Take by mouth.    . fluticasone (FLONASE) 50 MCG/ACT nasal spray Place into both nostrils daily.    [name] ipratropium-albuterol (DUONEB) 0.5-2.5 (3) MG/3ML SOLN Take 3 mLs by nebulization.    [name] lisinopril (PRINIVIL,ZESTRIL) 30 MG tablet Take 30 mg by mouth daily.     [name] loratadine (CLARITIN) 10 MG tablet Take 10 mg by mouth daily.    . metFORMIN (GLUCOPHAGE) 500 MG tablet Take 500 mg by mouth 2 (two) times daily with a meal.     . naproxen sodium (ALEVE) 220 MG tablet Take 220 mg by mouth at bedtime.    . psyllium (METAMUCIL) 58.6 % powder Take 1 packet by mouth at bedtime. SUGAR FREE ORANGE FLAVORED    . VENTOLIN HFA 108 (90 BASE) MCG/ACT inhaler Inhale 1 puff into the lungs every 6 (six) hours as needed for wheezing or shortness of breath.     . vitamin B-12 (CYANOCOBALAMIN) 1000 MCG tablet Take 1,000 mcg by mouth daily.    [name] zolpidem (AMBIEN) 5 MG tablet Take 5 mg by mouth at bedtime.      No current facility-administered medications on file prior to visit.     PAST MEDICAL HISTORY: Past Medical History:  Diagnosis Date  . Arthritis   . Asthma   . B12 deficiency   . Cancer [facility])    Prostate  . COPD (chronic obstructive pulmonary disease) ([place])   . Cough   . Decreased hearing   . Diabetes mellitus without complication ([place])   . Floaters in visual field   . Frequent urination   . Hoarseness   . Hyperlipidemia   . Hypertension    . Joint pain   . Lower back pain   . Muscle pain   . Muscle stiffness   . Neck stiffness   . Right hip pain   . Right knee pain   . Shortness of breath    with sudden awakening from sleep  . Tinnitus   . Trouble in sleeping   . Umbilical hernia   . Vitamin D deficiency   . Wheezing     PAST SURGICAL HISTORY: Past Surgical History:  Procedure Laterality Date  . COLONOSCOPY  2008   Dr. Gala Romney: 2 diminutive polyps in rectum (hyperplastic), otherwise normal  . COLONOSCOPY  2000   Per notes in epic, history of adenomas at outside facility  . COLONOSCOPY WITH PROPOFOL N/A 07/18/2017   Procedure: COLONOSCOPY WITH PROPOFOL;  Surgeon: Daneil Dolin, MD;  Location[facility];  Service: Endoscopy;  Laterality: N/A;  8:45  . HEMORRHOID SURGERY    . KNEE ARTHROSCOPY     Left  . KNEE ARTHROSCOPY  right  . PROSTATE SURGERY  2000   Prostate removal/Cancer    SOCIAL HISTORY: Social History   Tobacco Use  . Smoking status: Former Smoker    Types: Cigarettes    Last attempt to quit: 03/11/2002    Years since quitting: 16.2  . Smokeless tobacco: Never Used  Substance Use Topics  . Alcohol use: No    Frequency: Never  . Drug use: No    FAMILY HISTORY: Family History  Problem Relation Age of Onset  . Diabetes Mother   . Hypertension Mother   . Hyperlipidemia Mother   . Stroke Mother   . Heart disease Mother   . Depression Mother   . Obesity Mother   . Eating disorder Mother   . Cancer Father   . Depression Father   . Anxiety disorder Father   . Liver disease Father   . Alcoholism Father   . Cancer Sister        Breast  . Colon cancer Neg Hx     ROS: Review of Systems  Constitutional: Negative for weight loss.  Musculoskeletal: Negative for myalgias.  Endo/Heme/Allergies:       Negative for hypoglycemia    PHYSICAL EXAM: Blood pressure (!) 144/76, pulse 83, temperature 97.9 F (36.6 C), temperature source Oral, height 5\' 10"  (1.778 m), weight 294 lb  (133.4 kg), SpO2 95 %. Body mass index is 42.18 kg/m. Physical Exam Vitals signs reviewed.  Constitutional:      Appearance: Normal appearance. He is well-developed. He is obese.  Cardiovascular:     Rate and Rhythm: Normal rate.  Pulmonary:     Effort: Pulmonary effort is normal.  Musculoskeletal: Normal range of motion.  Skin:    General: Skin is warm and dry.  Neurological:     Mental Status: He is alert and oriented to person, place, and time.  Psychiatric:        Mood and Affect: Mood normal.        Behavior: Behavior normal.     RECENT LABS AND TESTS: BMET    Component Value Date/Time   NA 144 03/25/2018 1041   K 4.6 03/25/2018 1041   CL 102 03/25/2018 1041   CO2 18 (L) 03/25/2018 1041   GLUCOSE 108 (H) 03/25/2018 1041   GLUCOSE 158 (H) 07/12/2017 0850   BUN 12 03/25/2018 1041   CREATININE 0.97 03/25/2018 1041   CALCIUM 9.7 03/25/2018 1041   GFRNONAA 79 03/25/2018 1041   GFRAA 91 03/25/2018 1041   Lab Results  Component Value Date   HGBA1C 6.6 (H) 03/25/2018   No results found for: INSULIN CBC    Component Value Date/Time   WBC 7.6 07/12/2017 0850   RBC 5.05 07/12/2017 0850   HGB 14.3 07/12/2017 0850   HCT 45.1 07/12/2017 0850   PLT 213 07/12/2017 0850   MCV 89.3 07/12/2017 0850   MCH 28.3 07/12/2017 0850   MCHC 31.7 07/12/2017 0850   RDW 12.9 07/12/2017 0850   LYMPHSABS 2.8 07/12/2017 0850   MONOABS 0.3 07/12/2017 0850   EOSABS 0.1 07/12/2017 0850   BASOSABS 0.0 07/12/2017 0850   Iron/TIBC/Ferritin/ %Sat No results found for: IRON, TIBC, FERRITIN, IRONPCTSAT Lipid Panel     Component Value Date/Time   CHOL 170 03/25/2018 1041   TRIG 88 03/25/2018 1041   HDL 52 03/25/2018 1041   CHOLHDL 3.3 03/25/2018 1041   LDLCALC 100 (H) 03/25/2018 1041   Hepatic Function Panel     Component Value Date/Time  PROT 6.9 03/25/2018 1041   ALBUMIN 4.5 03/25/2018 1041   AST 15 03/25/2018 1041   ALT 16 03/25/2018 1041   ALKPHOS 92 03/25/2018 1041    BILITOT 0.3 03/25/2018 1041   No results found for: TSH    Ref. Range 03/25/2018 10:41  Vitamin D, 25-Hydroxy Latest Ref Range: 30.0 - 100.0 ng/mL 42.6    OBESITY BEHAVIORAL INTERVENTION VISIT  Today's visit was # 7   Starting weight: 294 lbs Starting date: 03/25/2018 Today's weight : 294 lbs Today's date: 06/24/2018 Total lbs lost to date: 0 At least 15 minutes were spent on discussing the following behavioral intervention visit.   ASK: We discussed the diagnosis of obesity with [name] today and Raji agreed to give [name] permission to discuss obesity behavioral modification therapy today.  ASSESS: Arbie has the diagnosis of obesity and his BMI today is 46.18 Nobel is in the action stage of change   ADVISE: Lenzie was educated on the multiple health risks of obesity as well as the benefit of weight loss to improve his health. He was advised of the need for long term treatment and the importance of lifestyle modifications to improve his current health and to decrease his risk of future health problems.  AGREE: Multiple dietary modification options and treatment options were discussed and  Charbel agreed to follow the recommendations documented in the above note.  ARRANGE: Sergi was educated on the importance of frequent visits to treat obesity as outlined per CMS and USPSTF guidelines and agreed to schedule his next follow up appointment today.  Corey Skains, am acting as Location manager for [employer]. Owens Shark, DO  I have reviewed the above documentation for accuracy and completeness, and I agree with the above. -Jearld Lesch, DO

## 2018-06-26 ENCOUNTER — Ambulatory Visit (INDEPENDENT_AMBULATORY_CARE_PROVIDER_SITE_OTHER): Payer: Medicare Other | Admitting: Psychology

## 2018-06-26 DIAGNOSIS — F3289 Other specified depressive episodes: Secondary | ICD-10-CM | POA: Diagnosis not present

## 2018-06-26 NOTE — Progress Notes (Signed)
Office: [phone]  /  Fax: [phone]   Date: June 26, 2018   Time Seen: 3:50pm Duration: 23 minutes Provider: Glennie Isle, Psy.D. Type of Session: Individual Therapy   HPI: Johnwas referred by Dr. Ronney Asters to depression with emotional eating behaviorsand was seen for an initial appointment with this provider on April 01, 2018. Per the note for the initial visit withDr. Norville Haggard March 25, 2018,"Johnnotes binge eating but no formal diagnosis. Taitum struggleswith emotional eating and using food for comfort to the extent that it is negatively impacting hishealth. Heoften snacks when heis not hungry. Johnsometimes feels heis out of control and then feels guilty that hemade poor food choices. Hehas been working on behavior modification techniques to help reduce hisemotional eating and has been somewhat successful.Heshows no sign of suicidal or homicidal ideations."In addition during the initial appointment with Dr. Owens Shark, Jenny Reichmann reported experiencing the following:significant food cravings issues; snackingfrequently in the evenings; skippingmeals frequently;frequently drinking liquids with calories; frequently  eatinglarger portions than normal;binge eating behaviors; and strugglingwith emotional eating.Also, per the note for the initial visit withDr. Sherry Ruffing March 25, 2018,Toni started gaining weight after his his prostate surgery and knee injuries. His heaviest weight ever was 315 pounds.During the initial appointment with this provider,Dresean reported, "The reason I ended up at the clinic was because I failed a couple colonoscopies." He described having a hernia and various medical appointments resulting in the suggestion of bariatric surgery. After "weighing the pros and cons," Juston discussed he decided to try weight loss. Regarding emotional eating, Tymir indicated he tends to eat peanuts and ice cream at night while watching television. He  reported he stopped buying peanuts.Moreover, he described a reduction in eating larger portions since increased awareness of appropriate portion sizes. Since starting with the prescribed meal plan, Kameran stated, "It's going great. I feel better. 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 Johnendorsed the following: overeat when you are celebrating, eat certain foods when you are anxious, stressed, depressed, or your feelings are hurt, not worry about what you eat when you are in a good mood and eat as a reward.  Session Content: Session focused on termination. The session was initiated with the administration of the PHQ-9 and GAD-7, as well as a brief check-in. Coren shared about recent events, including a plan for hosting a Christmas party.  Regarding eating, Zeb acknowledged deviating from the meal plan as he has been consuming holiday foods. He also discussed foods on the meal plan are getting "redundant;" therefore, this provider recommended he discuss other options during his appointment with the dietitian. He agreed. Session then focused on termination planning. Emauri indicated, "You have given me quite a bit to set me on course;" therefore, he declined future appointments. Thus, today's appointment focused on reviewing learned skills. Regarding mindfulness, Giovanie indicated he continues to practice and noted an overall decrease in emotional eating since onset of treatment with this provider specifically as it relates to eating out of habit.  Remainder of the appointment focused on reviewing behavioral strategies for the holidays and Braidan discussed a plan to share leftovers with family members. Bashar was receptive to today's session as evidenced by openness to sharing, responsiveness to feedback, and  willingness to continue engaging in learned skills.  Mental Status Examination: Din arrived early for the appointment; therefore, the appointment was initiated early. He presented  as appropriately dressed and groomed. Camarion appeared his stated age and demonstrated adequate orientation to time, place, person, and purpose of the appointment. He also demonstrated appropriate eye contact. No psychomotor abnormalities or behavioral peculiarities noted. His mood was euthymic with congruent affect. His thought processes were logical, linear, and goal-directed. No hallucinations, delusions, bizarre thinking or behavior reported or observed. Judgment, insight, and impulse control appeared to be grossly intact. There was no evidence of paraphasias (i.e., errors in speech, gross mispronunciations, and word substitutions), repetition deficits, or disturbances in volume or prosody (i.e., rhythm and intonation). There was no evidence of attention or memory impairments. Thelma denied current suicidal and homicidal ideation, intent or plan.  Structured Assessment Results: The Patient Health Questionnaire-9 (PHQ-9) is a self-report measure that assesses symptoms and severity of depression over the course of the last two weeks. Ahman obtained a score of 1 suggesting minimal depression. Mj finds the endorsed symptoms to be not difficult at all. Depression screen [facility] 2/9 06/26/2018  Decreased Interest 0  Down, Depressed, Hopeless 0  PHQ - 2 Score 0  Altered sleeping 1  Tired, decreased energy 0  Change in appetite 0  Feeling bad or failure about yourself  0  Trouble concentrating 0  Moving slowly or fidgety/restless 0  Suicidal thoughts 0  PHQ-9 Score 1  Difficult doing work/chores -   The Generalized Anxiety Disorder-7 (GAD-7) is a brief self-report measure that assesses symptoms of anxiety over the course of the last two weeks. Aristide obtained a score of zero. GAD 7 : Generalized Anxiety Score 06/26/2018  Nervous,  Anxious, on Edge 0  Control/stop worrying 0  Worry too much - different things 0  Trouble relaxing 0  Restless 0  Easily annoyed or irritable 0  Afraid - awful might happen 0  Total GAD 7 Score 0  Anxiety Difficulty -   Interventions: Zaydyn was administered the PHQ-9 and GAD-7 for symptom monitoring. Throughout today's session, empathic reflections and validation were provided. The appointment focused on termination which included reviewing learned skills. Behavioral strategies for the upcoming holidays were also reviewed.  DSM-5 Diagnosis: 311 (F32.8) Other Specified Depressive Disorder, Emotional Eating Behaviors  Treatment Goal & Progress: During the initial appointment with this provider, the following treatment goal was established: decrease emotional eating. Tywon has demonstrated progress in his goal as evidenced by increased awareness of hunger patterns andtriggers foremotional eating. Per Copelan's self-report, he continues to engage in learned skills and plans to meet with the dietitian in January. He also noted an overall decrease in emotional eating.  Plan: Per Elmar's request, today was the last appointment with this provider. This provider explained that should he wish to reinitiate therapeutic services, he may inform Dr. Owens Shark or this provider's office and a referral for longer-term therapeutic services will be placed. Jaken verbally acknowledged understanding.

## 2018-07-04 ENCOUNTER — Other Ambulatory Visit (HOSPITAL_COMMUNITY): Payer: Self-pay | Admitting: Internal Medicine

## 2018-07-04 ENCOUNTER — Ambulatory Visit (HOSPITAL_COMMUNITY)
Admission: RE | Admit: 2018-07-04 | Discharge: 2018-07-04 | Disposition: A | Discharge status: Home / Self Care | Payer: Medicare Other | Source: Ambulatory Visit | Attending: Internal Medicine | Admitting: Internal Medicine

## 2018-07-04 DIAGNOSIS — R0602 Shortness of breath: Secondary | ICD-10-CM

## 2018-07-04 DIAGNOSIS — R079 Chest pain, unspecified: Secondary | ICD-10-CM | POA: Diagnosis not present

## 2018-07-14 ENCOUNTER — Ambulatory Visit (INDEPENDENT_AMBULATORY_CARE_PROVIDER_SITE_OTHER): Payer: Self-pay | Admitting: Dietician

## 2018-07-14 ENCOUNTER — Encounter (INDEPENDENT_AMBULATORY_CARE_PROVIDER_SITE_OTHER): Payer: Self-pay

## 2018-07-15 ENCOUNTER — Emergency Department (HOSPITAL_COMMUNITY)
Admission: EM | Admit: 2018-07-15 | Discharge: 2018-07-16 | Disposition: A | Discharge status: Home / Self Care | Payer: Medicare Other | Attending: Emergency Medicine | Admitting: Emergency Medicine

## 2018-07-15 ENCOUNTER — Encounter (HOSPITAL_COMMUNITY): Payer: Self-pay | Admitting: Emergency Medicine

## 2018-07-15 ENCOUNTER — Emergency Department (HOSPITAL_COMMUNITY): Payer: Medicare Other

## 2018-07-15 DIAGNOSIS — Z87891 Personal history of nicotine dependence: Secondary | ICD-10-CM | POA: Insufficient documentation

## 2018-07-15 DIAGNOSIS — B9789 Other viral agents as the cause of diseases classified elsewhere: Secondary | ICD-10-CM | POA: Diagnosis not present

## 2018-07-15 DIAGNOSIS — E119 Type 2 diabetes mellitus without complications: Secondary | ICD-10-CM | POA: Insufficient documentation

## 2018-07-15 DIAGNOSIS — J069 Acute upper respiratory infection, unspecified: Secondary | ICD-10-CM | POA: Insufficient documentation

## 2018-07-15 DIAGNOSIS — R0789 Other chest pain: Secondary | ICD-10-CM

## 2018-07-15 DIAGNOSIS — R079 Chest pain, unspecified: Secondary | ICD-10-CM | POA: Diagnosis not present

## 2018-07-15 DIAGNOSIS — Z79899 Other long term (current) drug therapy: Secondary | ICD-10-CM | POA: Insufficient documentation

## 2018-07-15 DIAGNOSIS — J449 Chronic obstructive pulmonary disease, unspecified: Secondary | ICD-10-CM | POA: Insufficient documentation

## 2018-07-15 DIAGNOSIS — I10 Essential (primary) hypertension: Secondary | ICD-10-CM | POA: Insufficient documentation

## 2018-07-15 LAB — BASIC METABOLIC PANEL
Anion gap: 8 (ref 5–15)
BUN: 11 mg/dL (ref 8–23)
CO2: 25 mmol/L (ref 22–32)
Calcium: 9.3 mg/dL (ref 8.9–10.3)
Chloride: 106 mmol/L (ref 98–111)
Creatinine, Ser: 0.89 mg/dL (ref 0.61–1.24)
GFR calc Af Amer: >60 mL/min (ref >60)
GFR calc non Af Amer: >60 mL/min (ref >60)
GLUCOSE: 166 mg/dL — AB (ref 70–99)
Potassium: 4 mmol/L (ref 3.5–5.1)
Sodium: 139 mmol/L (ref 135–145)

## 2018-07-15 LAB — CBC
HCT: 45.9 % (ref 39.0–52.0)
Hemoglobin: 14.5 g/dL (ref 13.0–17.0)
MCH: 28.5 pg (ref 26.0–34.0)
MCHC: 31.6 g/dL (ref 30.0–36.0)
MCV: 90.2 fL (ref 80.0–100.0)
Platelets: 209 10*3/uL (ref 150–400)
RBC: 5.09 MIL/uL (ref 4.22–5.81)
RDW: 12.3 % (ref 11.5–15.5)
WBC: 10.1 10*3/uL (ref 4.0–10.5)
nRBC: 0 % (ref 0.0–0.2)

## 2018-07-15 LAB — I-STAT TROPONIN, ED: Troponin i, poc: 0 ng/mL (ref 0.00–0.08)

## 2018-07-15 NOTE — ED Triage Notes (Signed)
Pt reports SOB, right sided chest pain that "goes around to the center of my back" for "a month off and on."  Worse w/ deep breaths while lying down.  Pt does have non-productive cough X3days.

## 2018-07-16 DIAGNOSIS — J069 Acute upper respiratory infection, unspecified: Secondary | ICD-10-CM | POA: Diagnosis not present

## 2018-07-16 LAB — I-STAT TROPONIN, ED: Troponin i, poc: 0 ng/mL (ref 0.00–0.08)

## 2018-07-16 LAB — D-DIMER, QUANTITATIVE (NOT AT ARMC): D DIMER QUANT: 0.43 ug{FEU}/mL (ref 0.00–0.50)

## 2018-07-16 MED ORDER — OXYMETAZOLINE HCL 0.05 % NA SOLN
3.0000 | Freq: Once | NASAL | Status: AC
  Administered 2018-07-16: 3 via NASAL
  Filled 2018-07-16: qty 15

## 2018-07-16 NOTE — Discharge Instructions (Addendum)
Your cardiac labs today were normal.  Your EKG showed no sign of heart attack.  Your chest x-ray was clear showing no pneumonia, volume overload, collapsed lung.  Your blood tests showed no sign of blood clots.  You may use Afrin 2 sprays in each nostril 3 times daily as needed for nasal congestion.  Do not use this medication for more than 3 days.  You may continue your nasal Flonase as prescribed.  Your symptoms are caused by a virus.  You do not need antibiotics at this time.  There was no pneumonia seen on your chest x-ray.

## 2018-07-16 NOTE — ED Provider Notes (Signed)
TIME SEEN: 4:10 AM  CHIEF COMPLAINT: Right-sided chest pain, cough and nasal congestion  HPI: Patient is a 73 year old male with history of hypertension, hyperlipidemia, diabetes, COPD, prostate cancer who presents to the emergency department with right-sided chest pain that he has had for the past month.  Unable to describe the pain.  States that it is worse with coughing, deep inspiration, certain positions.  States he came to the emergency department tonight because he has had 3 days of productive cough and nasal congestion and felt like he could not breathe because of the nasal congestion.  Reports subjective fevers.  No sick contacts or recent travel.  Is using Flonase at home without relief.  No history of CAD, PE or DVT.  No lower extremity swelling or pain.  ROS: See HPI Constitutional: no fever  Eyes: no drainage  ENT: no runny nose   Cardiovascular:   chest pain  Resp:  SOB secondary to nasal congestion GI: no vomiting GU: no dysuria Integumentary: no rash  Allergy: no hives  Musculoskeletal: no leg swelling  Neurological: no slurred speech ROS otherwise negative  PAST MEDICAL HISTORY/PAST SURGICAL HISTORY:  Past Medical History:  Diagnosis Date  . Arthritis   . Asthma   . B12 deficiency   . Cancer [facility])    Prostate  . COPD (chronic obstructive pulmonary disease) ([place])   . Cough   . Decreased hearing   . Diabetes mellitus without complication ([place])   . Floaters in visual field   . Frequent urination   . Hoarseness   . Hyperlipidemia   . Hypertension   . Joint pain   . Lower back pain   . Muscle pain   . Muscle stiffness   . Neck stiffness   . Right hip pain   . Right knee pain   . Shortness of breath    with sudden awakening from sleep  . Tinnitus   . Trouble in sleeping   . Umbilical hernia   . Vitamin D deficiency   . Wheezing     MEDICATIONS:  Prior to Admission medications   Medication Sig Start Date End Date Taking? Authorizing Provider   acetaminophen (TYLENOL) 500 MG tablet Take 1,000 mg by mouth every 6 (six) hours as needed.    [provider]  atorvastatin (LIPITOR) 20 MG tablet Take 20 mg by mouth daily.  03/04/13   [provider]  Cholecalciferol (VITAMIN D3) 1000 units CAPS Take by mouth.    [provider]  fluticasone (FLONASE) 50 MCG/ACT nasal spray Place into both nostrils daily.    [provider]  ipratropium-albuterol (DUONEB) 0.5-2.5 (3) MG/3ML SOLN Take 3 mLs by nebulization.    [provider]  lisinopril (PRINIVIL,ZESTRIL) 30 MG tablet Take 30 mg by mouth daily.  03/04/13   [provider]  loratadine (CLARITIN) 10 MG tablet Take 10 mg by mouth daily.    [provider]  metFORMIN (GLUCOPHAGE) 500 MG tablet Take 500 mg by mouth 2 (two) times daily with a meal.  03/03/13   [provider]  naproxen sodium (ALEVE) 220 MG tablet Take 220 mg by mouth at bedtime.    [provider]  psyllium (METAMUCIL) 58.6 % powder Take 1 packet by mouth at bedtime. SUGAR FREE ORANGE FLAVORED    [provider]  VENTOLIN HFA 108 (90 BASE) MCG/ACT inhaler Inhale 1 puff into the lungs every 6 (six) hours as needed for wheezing or shortness of breath.  03/30/13  [provider]  vitamin B-12 (CYANOCOBALAMIN) 1000 MCG tablet Take 1,000 mcg by mouth daily.    [provider]  zolpidem (AMBIEN) 5 MG tablet Take 5 mg by mouth at bedtime.  12/08/12   [provider]    ALLERGIES:  Allergies  Allergen Reactions  . Penicillins Hives, Itching and Other (See Comments)    Has patient had a PCN reaction causing immediate rash, facial/tongue/throat swelling, SOB or lightheadedness with hypotension: Yes Has patient had a PCN reaction causing severe rash involving mucus membranes or skin necrosis: No Has patient had a PCN reaction that required hospitalization: No Has patient had a PCN reaction occurring within the last 10 years:  No If all of the above answers are "NO", then may proceed with Cephalosporin use.     SOCIAL HISTORY:  Social History   Tobacco Use  . Smoking status: Former Smoker    Types: Cigarettes    Last attempt to quit: 03/11/2002    Years since quitting: 16.3  . Smokeless tobacco: Never Used  Substance Use Topics  . Alcohol use: No    Frequency: Never    FAMILY HISTORY: Family History  Problem Relation Age of Onset  . Diabetes Mother   . Hypertension Mother   . Hyperlipidemia Mother   . Stroke Mother   . Heart disease Mother   . Depression Mother   . Obesity Mother   . Eating disorder Mother   . Cancer Father   . Depression Father   . Anxiety disorder Father   . Liver disease Father   . Alcoholism Father   . Cancer Sister        Breast  . Colon cancer Neg Hx     EXAM: BP (!) 147/92 (BP Location: Right Arm)   Pulse 83   Temp 98.1 F (36.7 C) (Oral)   Resp 18   Ht 5\' 11"  (1.803 m)   Wt 127 kg   SpO2 97%   BMI 39.05 kg/m  CONSTITUTIONAL: Alert and oriented and responds appropriately to questions. Well-appearing; well-nourished, elderly HEAD: Normocephalic EYES: Conjunctivae clear, pupils appear equal, EOMI ENT: normal nose; moist mucous membranes NECK: Supple, no meningismus, no nuchal rigidity, no LAD  CARD: RRR; S1 and S2 appreciated; no murmurs, no clicks, no rubs, no gallops CHEST:  Chest wall is nontender to palpation.  No crepitus, ecchymosis, erythema, warmth, rash or other lesions present.   RESP: Normal chest excursion without splinting or tachypnea; breath sounds clear and equal bilaterally; no wheezes, no rhonchi, no rales, no hypoxia or respiratory distress, speaking full sentences ABD/GI: Normal bowel sounds; non-distended; soft, non-tender, no rebound, no guarding, no peritoneal signs, no hepatosplenomegaly BACK:  The back appears normal and is non-tender to palpation, there is no CVA tenderness EXT: Normal ROM in all joints; non-tender to palpation; no  edema; normal capillary refill; no cyanosis, no calf tenderness or swelling    SKIN: Normal color for age and race; warm; no rash NEURO: Moves all extremities equally PSYCH: The patient's mood and manner are appropriate. Grooming and personal hygiene are appropriate.  MEDICAL DECISION MAKING: Patient here with right-sided atypical chest pain.  First troponin negative.  EKG shows no ischemic change.  Chest x-ray is clear.  Will obtain second troponin but doubt that this is ACS especially given length of symptoms and that they are very atypical.  He denies any exertional symptoms.  States that he mainly came in tonight because he has had 3 days of nasal congestion  and feels like he cannot breathe through his nose.  His lungs are clear and his oxygen saturation is 100% on room air.  He is in no respiratory distress.  He has been using Flonase without relief.  Discussed with patient and wife that this is likely a viral illness causing his symptoms.  I do not even think this is the flu given he is afebrile. I explained to them multiple times why do not feel antibiotics are indicated.  Wife seems very unhappy with this.  Explained to her that this seems like a cold.  Will give Afrin for symptomatic relief.  I will obtain a d-dimer to evaluate for possible PE although less likely given normal vital signs.  He does have history of prostate cancer.  ED PROGRESS: Patient second troponin is negative.  D-dimer negative.  He reports his nasal congestion has completely resolved after Afrin which is made him and his wife very happy.  I feel he is safe to be discharged home with follow-up with his PCP for his atypical right-sided chest pain.  Again I do not feel he needs antibiotics for his viral URI.   At this time, I do not feel there is any life-threatening condition present. I have reviewed and discussed all results (EKG, imaging, lab, urine as appropriate) and exam findings with patient/family. I have reviewed  nursing notes and appropriate previous records.  I feel the patient is safe to be discharged home without further emergent workup and can continue workup as an outpatient as needed. Discussed usual and customary return precautions. Patient/family verbalize understanding and are comfortable with this plan.  Outpatient follow-up has been provided as needed. All questions have been answered.      EKG Interpretation  Date/Time:  Tuesday July 15 2018 21:43:16 EST Ventricular Rate:  87 PR Interval:  174 QRS Duration: 80 QT Interval:  344 QTC Calculation: 413 R Axis:   -38 Text Interpretation:  Normal sinus rhythm Left axis deviation Cannot rule out Anterior infarct , age undetermined Abnormal ECG No significant change since last tracing Confirmed by Romelo Sciandra, Cyril Mourning [phone]) on 07/16/2018 3:44:39 AM          Kianah Harries, Delice Bison, DO 07/16/18 1308

## 2018-07-21 ENCOUNTER — Ambulatory Visit (INDEPENDENT_AMBULATORY_CARE_PROVIDER_SITE_OTHER): Payer: Medicare Other | Admitting: Bariatrics

## 2018-07-21 ENCOUNTER — Encounter (INDEPENDENT_AMBULATORY_CARE_PROVIDER_SITE_OTHER): Payer: Self-pay | Admitting: Bariatrics

## 2018-07-21 VITALS — BP 144/73 | HR 89 | Temp 98.4°F | Ht 70.0 in | Wt 295.0 lb

## 2018-07-21 DIAGNOSIS — Z6841 Body Mass Index (BMI) 40.0 and over, adult: Secondary | ICD-10-CM | POA: Diagnosis not present

## 2018-07-21 DIAGNOSIS — E119 Type 2 diabetes mellitus without complications: Secondary | ICD-10-CM

## 2018-07-21 DIAGNOSIS — E7849 Other hyperlipidemia: Secondary | ICD-10-CM

## 2018-07-22 NOTE — Progress Notes (Signed)
Office: [phone]  /  Fax: [phone]   HPI:   Chief Complaint: OBESITY Travis Blanchard is here to discuss his progress with his obesity treatment plan. He is on the Category 3 plan and is following his eating plan approximately 50 % of the time. He states he is exercising 0 minutes 0 times per week. Travis Blanchard states that he "fell off the wagon". He has not been following the diet well. He is thinking about bariatric surgery. His weight is 295 lb (133.8 kg) today and has had a weight gain of 1 pound over a period of 4 weeks since his last visit. He has gained 1 lb since starting treatment with [name].  Hyperlipidemia Travis Blanchard has hyperlipidemia and he is currently taking Atorvastatin. He has been trying to improve his cholesterol levels with intensive lifestyle modification including a low saturated fat diet, exercise and weight loss. He denies myalgias.  Diabetes II Travis Blanchard has a diagnosis of diabetes type II. Travis Blanchard states fasting BGs range between 95 and 120 and he denies any hypoglycemic episodes. Last A1c was at 6.6 He has been working on intensive lifestyle modifications including diet, exercise, and weight loss to help control his blood glucose levels.  ASSESSMENT AND PLAN:  Other hyperlipidemia  Type 2 diabetes mellitus without complication, without long-term current use of insulin (HCC)  Class 3 severe obesity with serious comorbidity and body mass index (BMI) of 40.0 to 44.9 in adult, unspecified obesity type ([place]) - Plan: Ambulatory referral to General Surgery  PLAN:  Hyperlipidemia Travis Blanchard was informed of the American Heart Association Guidelines emphasizing intensive lifestyle modifications as the first line treatment for hyperlipidemia. We discussed many lifestyle modifications today in depth, and Travis Blanchard will continue to work on decreasing saturated fats such as fatty red meat, butter and many fried foods. He will also increase vegetables and lean protein in his diet and continue to work on exercise  and weight loss efforts. Travis Blanchard will continue Atorvastatin and follow up as directed.  Diabetes II Travis Blanchard has been given extensive diabetes education by myself today including ideal fasting and post-prandial blood glucose readings, individual ideal Hgb A1c goals and hypoglycemia prevention. We discussed the importance of good blood sugar control to decrease the likelihood of diabetic complications such as nephropathy, neuropathy, limb loss, blindness, coronary artery disease, and death. We discussed the importance of intensive lifestyle modification including diet, exercise and weight loss as the first line treatment for diabetes. Travis Blanchard agrees to continue his diabetes medications and will follow up at the agreed upon time.  Obesity Travis Blanchard is currently in the action stage of change. As such, his goal is to continue with weight loss efforts He has agreed to follow the Category 3 plan Travis Blanchard has been instructed to work up to a goal of 150 minutes of combined cardio and strengthening exercise per week for weight loss and overall health benefits. We discussed the following Behavioral Modification Strategies today: increase H2O intake, keeping healthy foods in the home, increasing lean protein intake, decreasing simple carbohydrates, increasing vegetables and work on meal planning and easy cooking plans We will refer for bariatric surgery ([facility]).  Travis Blanchard has agreed to follow up with our clinic in 4 weeks. He was informed of the importance of frequent follow up visits to maximize his success with intensive lifestyle modifications for his multiple health conditions.  ALLERGIES: Allergies  Allergen Reactions  . Penicillins Hives, Itching and Other (See Comments)    Has patient had a PCN reaction causing immediate rash, facial/tongue/throat  swelling, SOB or lightheadedness with hypotension: Yes Has patient had a PCN reaction causing severe rash involving mucus membranes or skin necrosis: No Has patient had a PCN  reaction that required hospitalization: No Has patient had a PCN reaction occurring within the last 10 years: No If all of the above answers are "NO", then may proceed with Cephalosporin use.     MEDICATIONS: Current Outpatient Medications on File Prior to Visit  Medication Sig Dispense Refill  . acetaminophen (TYLENOL) 500 MG tablet Take 1,000 mg by mouth every 6 (six) hours as needed.    [name] atorvastatin (LIPITOR) 20 MG tablet Take 20 mg by mouth daily.     . Cholecalciferol (VITAMIN D3) 1000 units CAPS Take by mouth.    . fluticasone (FLONASE) 50 MCG/ACT nasal spray Place into both nostrils daily.    [name] ipratropium-albuterol (DUONEB) 0.5-2.5 (3) MG/3ML SOLN Take 3 mLs by nebulization.    [name] lisinopril (PRINIVIL,ZESTRIL) 30 MG tablet Take 30 mg by mouth daily.     [name] loratadine (CLARITIN) 10 MG tablet Take 10 mg by mouth daily.    . metFORMIN (GLUCOPHAGE) 500 MG tablet Take 500 mg by mouth 2 (two) times daily with a meal.     . naproxen sodium (ALEVE) 220 MG tablet Take 220 mg by mouth at bedtime.    . psyllium (METAMUCIL) 58.6 % powder Take 1 packet by mouth at bedtime. SUGAR FREE ORANGE FLAVORED    . VENTOLIN HFA 108 (90 BASE) MCG/ACT inhaler Inhale 1 puff into the lungs every 6 (six) hours as needed for wheezing or shortness of breath.     . vitamin B-12 (CYANOCOBALAMIN) 1000 MCG tablet Take 1,000 mcg by mouth daily.    [name] zolpidem (AMBIEN) 5 MG tablet Take 5 mg by mouth at bedtime.      No current facility-administered medications on file prior to visit.     PAST MEDICAL HISTORY: Past Medical History:  Diagnosis Date  . Arthritis   . Asthma   . B12 deficiency   . Cancer [facility])    Prostate  . COPD (chronic obstructive pulmonary disease) ([place])   . Cough   . Decreased hearing   . Diabetes mellitus without complication ([place])   . Floaters in visual field   . Frequent urination   . Hoarseness   . Hyperlipidemia   . Hypertension   . Joint pain   . Lower back pain   . Muscle pain    . Muscle stiffness   . Neck stiffness   . Right hip pain   . Right knee pain   . Shortness of breath    with sudden awakening from sleep  . Tinnitus   . Trouble in sleeping   . Umbilical hernia   . Vitamin D deficiency   . Wheezing     PAST SURGICAL HISTORY: Past Surgical History:  Procedure Laterality Date  . COLONOSCOPY  2008   Dr. Gala Romney: 2 diminutive polyps in rectum (hyperplastic), otherwise normal  . COLONOSCOPY  2000   Per notes in epic, history of adenomas at outside facility  . COLONOSCOPY WITH PROPOFOL N/A 07/18/2017   Procedure: COLONOSCOPY WITH PROPOFOL;  Surgeon: Daneil Dolin, MD;  [facility];  Service: Endoscopy;  Laterality: N/A;  8:45  . HEMORRHOID SURGERY    . KNEE ARTHROSCOPY     Left  . KNEE ARTHROSCOPY     right  . PROSTATE SURGERY  2000   Prostate removal/Cancer    SOCIAL  HISTORY: Social History   Tobacco Use  . Smoking status: Former Smoker    Types: Cigarettes    Last attempt to quit: 03/11/2002    Years since quitting: 16.3  . Smokeless tobacco: Never Used  Substance Use Topics  . Alcohol use: No    Frequency: Never  . Drug use: No    FAMILY HISTORY: Family History  Problem Relation Age of Onset  . Diabetes Mother   . Hypertension Mother   . Hyperlipidemia Mother   . Stroke Mother   . Heart disease Mother   . Depression Mother   . Obesity Mother   . Eating disorder Mother   . Cancer Father   . Depression Father   . Anxiety disorder Father   . Liver disease Father   . Alcoholism Father   . Cancer Sister        Breast  . Colon cancer Neg Hx     ROS: Review of Systems  Constitutional: Negative for weight loss.  Musculoskeletal: Negative for myalgias.  Endo/Heme/Allergies:       Negative for hypoglycemia    PHYSICAL EXAM: Blood pressure (!) 144/73, pulse 89, temperature 98.4 F (36.9 C), temperature source Oral, height 5\' 10"  (1.778 m), weight 295 lb (133.8 kg), SpO2 98 %. Body mass index is 42.33  kg/m. Physical Exam Vitals signs reviewed.  Constitutional:      Appearance: Normal appearance. He is well-developed. He is obese.  Cardiovascular:     Rate and Rhythm: Normal rate.  Pulmonary:     Effort: Pulmonary effort is normal.  Musculoskeletal: Normal range of motion.  Skin:    General: Skin is warm and dry.  Neurological:     Mental Status: He is alert and oriented to person, place, and time.  Psychiatric:        Mood and Affect: Mood normal.        Behavior: Behavior normal.     RECENT LABS AND TESTS: BMET    Component Value Date/Time   NA 139 07/15/2018 2200   NA 144 03/25/2018 1041   K 4.0 07/15/2018 2200   CL 106 07/15/2018 2200   CO2 25 07/15/2018 2200   GLUCOSE 166 (H) 07/15/2018 2200   BUN 11 07/15/2018 2200   BUN 12 03/25/2018 1041   CREATININE 0.89 07/15/2018 2200   CALCIUM 9.3 07/15/2018 2200   GFRNONAA >60 07/15/2018 2200   GFRAA >60 07/15/2018 2200   Lab Results  Component Value Date   HGBA1C 6.6 (H) 03/25/2018   No results found for: INSULIN CBC    Component Value Date/Time   WBC 10.1 07/15/2018 2200   RBC 5.09 07/15/2018 2200   HGB 14.5 07/15/2018 2200   HCT 45.9 07/15/2018 2200   PLT 209 07/15/2018 2200   MCV 90.2 07/15/2018 2200   MCH 28.5 07/15/2018 2200   MCHC 31.6 07/15/2018 2200   RDW 12.3 07/15/2018 2200   LYMPHSABS 2.8 07/12/2017 0850   MONOABS 0.3 07/12/2017 0850   EOSABS 0.1 07/12/2017 0850   BASOSABS 0.0 07/12/2017 0850   Iron/TIBC/Ferritin/ %Sat No results found for: IRON, TIBC, FERRITIN, IRONPCTSAT Lipid Panel     Component Value Date/Time   CHOL 170 03/25/2018 1041   TRIG 88 03/25/2018 1041   HDL 52 03/25/2018 1041   CHOLHDL 3.3 03/25/2018 1041   LDLCALC 100 (H) 03/25/2018 1041   Hepatic Function Panel     Component Value Date/Time   PROT 6.9 03/25/2018 1041   ALBUMIN 4.5 03/25/2018 1041  AST 15 03/25/2018 1041   ALT 16 03/25/2018 1041   ALKPHOS 92 03/25/2018 1041   BILITOT 0.3 03/25/2018 1041   No  results found for: TSH   Ref. Range 03/25/2018 10:41  Vitamin D, 25-Hydroxy Latest Ref Range: 30.0 - 100.0 ng/mL 42.6     OBESITY BEHAVIORAL INTERVENTION VISIT  Today's visit was # 8   Starting weight: 294 lbs Starting date: 03/25/2018 Today's weight : 295 lbs  Today's date: 07/21/2018 Total lbs lost to date: 0 At least 15 minutes were spent on discussing the following behavioral intervention visit.   ASK: We discussed the diagnosis of obesity with [name] today and Toure agreed to give [name] permission to discuss obesity behavioral modification therapy today.  ASSESS: Haydin has the diagnosis of obesity and his BMI today is 42.33 Renso is in the action stage of change   ADVISE: Ashyr was educated on the multiple health risks of obesity as well as the benefit of weight loss to improve his health. He was advised of the need for long term treatment and the importance of lifestyle modifications to improve his current health and to decrease his risk of future health problems.  AGREE: Multiple dietary modification options and treatment options were discussed and  Shawnta agreed to follow the recommendations documented in the above note.  ARRANGE: Walfred was educated on the importance of frequent visits to treat obesity as outlined per CMS and USPSTF guidelines and agreed to schedule his next follow up appointment today.  Corey Skains, am acting as Location manager for [employer]. Owens Shark, DO  I have reviewed the above documentation for accuracy and completeness, and I agree with the above. -Jearld Lesch, DO

## 2018-07-23 DIAGNOSIS — E7849 Other hyperlipidemia: Secondary | ICD-10-CM | POA: Insufficient documentation

## 2018-07-23 DIAGNOSIS — E785 Hyperlipidemia, unspecified: Secondary | ICD-10-CM | POA: Insufficient documentation

## 2018-07-30 DIAGNOSIS — F410 Panic disorder [episodic paroxysmal anxiety] without agoraphobia: Secondary | ICD-10-CM | POA: Diagnosis not present

## 2018-07-30 DIAGNOSIS — J069 Acute upper respiratory infection, unspecified: Secondary | ICD-10-CM | POA: Diagnosis not present

## 2018-07-30 DIAGNOSIS — E1165 Type 2 diabetes mellitus with hyperglycemia: Secondary | ICD-10-CM | POA: Diagnosis not present

## 2018-07-30 DIAGNOSIS — I10 Essential (primary) hypertension: Secondary | ICD-10-CM | POA: Diagnosis not present

## 2018-07-31 ENCOUNTER — Telehealth (INDEPENDENT_AMBULATORY_CARE_PROVIDER_SITE_OTHER): Payer: Self-pay | Admitting: Bariatrics

## 2018-07-31 ENCOUNTER — Encounter (INDEPENDENT_AMBULATORY_CARE_PROVIDER_SITE_OTHER): Payer: Self-pay

## 2018-07-31 NOTE — Telephone Encounter (Signed)
Spoke with the patient and gave him the number of [facility] to set up his info session. Also sent him a my chart with the same information. April, [place]

## 2018-07-31 NOTE — Telephone Encounter (Signed)
Mr. Travis Blanchard states Dr. Owens Shark was suppose to refer him to a gastric bypass surgeon.  He is calling because he hasn't heard from anyone yet.  Please advise. Thank you

## 2018-08-18 ENCOUNTER — Ambulatory Visit (INDEPENDENT_AMBULATORY_CARE_PROVIDER_SITE_OTHER): Payer: Medicare Other | Admitting: Bariatrics

## 2018-08-18 ENCOUNTER — Encounter (INDEPENDENT_AMBULATORY_CARE_PROVIDER_SITE_OTHER): Payer: Self-pay | Admitting: Bariatrics

## 2018-08-18 VITALS — BP 164/82 | HR 81 | Temp 97.9°F | Ht 70.0 in | Wt 298.0 lb

## 2018-08-18 DIAGNOSIS — Z6841 Body Mass Index (BMI) 40.0 and over, adult: Secondary | ICD-10-CM | POA: Diagnosis not present

## 2018-08-18 DIAGNOSIS — E7849 Other hyperlipidemia: Secondary | ICD-10-CM

## 2018-08-18 DIAGNOSIS — E119 Type 2 diabetes mellitus without complications: Secondary | ICD-10-CM

## 2018-08-18 DIAGNOSIS — K5909 Other constipation: Secondary | ICD-10-CM | POA: Diagnosis not present

## 2018-08-19 NOTE — Progress Notes (Signed)
Office: [phone]  /  Fax: [phone]   HPI:   Chief Complaint: OBESITY Travis Blanchard is here to discuss his progress with his obesity treatment plan. He is on the Category 3 plan and is following his eating plan approximately 75 % of the time. He states he is exercising 0 minutes 0 times per week. Travis Blanchard is struggling with his diet. His weight is 298 lb (135.2 kg) today and has had a weight gain of 3 pounds over a period of 4 weeks since his last visit. He has gained 4 lbs since starting treatment with [name]. He stated that he would like to investigate his bariatric surgical options.   Diabetes II Travis Blanchard has a diagnosis of diabetes type II. [name] is taking metformin and he denies any hypoglycemic episodes. Last A1c was at 6.6 He has been working on intensive lifestyle modifications including diet, exercise, and weight loss to help control his blood glucose levels.  Hyperlipidemia Travis Blanchard has hyperlipidemia and she is currently taking Atorvastatin. She has been trying to improve his cholesterol levels with intensive lifestyle modification including a low saturated fat diet, exercise and weight loss. He denies myalgias.  Constipation Travis Blanchard notes constipation for the last few weeks, worse since attempting weight loss. He states BM are less frequent and are not hard and painful. Travis Blanchard has been taking Metamucil. He denies nausea or vomiting. He has increased water weight.  ASSESSMENT AND PLAN:  Type 2 diabetes mellitus without complication, without long-term current use of insulin (HCC)  Other hyperlipidemia  Other constipation  Class 3 severe obesity with serious comorbidity and body mass index (BMI) of 40.0 to 44.9 in adult, unspecified obesity type ([place])  PLAN:  Diabetes II Travis Blanchard has been given extensive diabetes education by myself today including ideal fasting and post-prandial blood glucose readings, individual ideal Hgb A1c goals and hypoglycemia prevention. We discussed the importance of good  blood sugar control to decrease the likelihood of diabetic complications such as nephropathy, neuropathy, limb loss, blindness, coronary artery disease, and death. We discussed the importance of intensive lifestyle modification including diet, exercise and weight loss as the first line treatment for diabetes. Travis Blanchard agrees to continue metformin and follow up at the agreed upon time.  Hyperlipidemia Travis Blanchard was informed of the American Heart Association Guidelines emphasizing intensive lifestyle modifications as the first line treatment for hyperlipidemia. We discussed many lifestyle modifications today in depth, and Travis Blanchard will continue to work on decreasing saturated fats such as fatty red meat, butter and many fried foods. He will also increase vegetables and lean protein in his diet and continue to work on exercise and weight loss efforts. Travis Blanchard will continue his medications and follow up at the agreed upon time.  Constipation Travis Blanchard was informed decrease bowel movement frequency is normal while losing weight, but stools should not be hard or painful. He was advised to increase his H20 intake and work on increasing his fiber intake. High fiber foods were discussed today. [name] will continue Metamucil and he will begin Miralax. Wrangler agreed to follow up as directed.  Obesity Travis Blanchard is currently in the action stage of change. As such, his goal is to continue with weight loss efforts He has agreed to follow the Category 3 plan Travis Blanchard has been instructed to work up to a goal of 150 minutes of combined cardio and strengthening exercise per week for weight loss and overall health benefits. We discussed the following Behavioral Modification Strategies today: increase H2O intake, keeping healthy foods in the home,  increasing lean protein intake, decreasing simple carbohydrates, increasing vegetables and work on meal planning and easy cooking plans [facility] [phone]) will schedule for tomorrow. He  scheduled here at the office.   Travis Blanchard has agreed to follow up with our clinic in 4 weeks. He was informed of the importance of frequent follow up visits to maximize his success with intensive lifestyle modifications for his multiple health conditions.  ALLERGIES: Allergies  Allergen Reactions  . Penicillins Hives, Itching and Other (See Comments)    Has patient had a PCN reaction causing immediate rash, facial/tongue/throat swelling, SOB or lightheadedness with hypotension: Yes Has patient had a PCN reaction causing severe rash involving mucus membranes or skin necrosis: No Has patient had a PCN reaction that required hospitalization: No Has patient had a PCN reaction occurring within the last 10 years: No If all of the above answers are "NO", then may proceed with Cephalosporin use.     MEDICATIONS: Current Outpatient Medications on File Prior to Visit  Medication Sig Dispense Refill  . acetaminophen (TYLENOL) 500 MG tablet Take 1,000 mg by mouth every 6 (six) hours as needed.    Travis Blanchard atorvastatin (LIPITOR) 20 MG tablet Take 20 mg by mouth daily.     . Cholecalciferol (VITAMIN D3) 1000 units CAPS Take by mouth.    . fluticasone (FLONASE) 50 MCG/ACT nasal spray Place into both nostrils daily.    Travis Blanchard ipratropium-albuterol (DUONEB) 0.5-2.5 (3) MG/3ML SOLN Take 3 mLs by nebulization.    Travis Blanchard lisinopril (PRINIVIL,ZESTRIL) 30 MG tablet Take 30 mg by mouth daily.     Travis Blanchard loratadine (CLARITIN) 10 MG tablet Take 10 mg by mouth daily.    . metFORMIN (GLUCOPHAGE) 500 MG tablet Take 500 mg by mouth 2 (two) times daily with a meal.     . naproxen sodium (ALEVE) 220 MG tablet Take 220 mg by mouth at bedtime.    . psyllium (METAMUCIL) 58.6 % powder Take 1 packet by mouth at bedtime. SUGAR FREE ORANGE FLAVORED    . VENTOLIN HFA 108 (90 BASE) MCG/ACT inhaler Inhale 1 puff into the lungs every 6 (six) hours as needed for wheezing or shortness of breath.     . vitamin B-12 (CYANOCOBALAMIN) 1000 MCG tablet Take  1,000 mcg by mouth daily.    Travis Blanchard zolpidem (AMBIEN) 5 MG tablet Take 5 mg by mouth at bedtime.      No current facility-administered medications on file prior to visit.     PAST MEDICAL HISTORY: Past Medical History:  Diagnosis Date  . Arthritis   . Asthma   . B12 deficiency   . Cancer [facility])    Prostate  . COPD (chronic obstructive pulmonary disease) ([place])   . Cough   . Decreased hearing   . Diabetes mellitus without complication ([place])   . Floaters in visual field   . Frequent urination   . Hoarseness   . Hyperlipidemia   . Hypertension   . Joint pain   . Lower back pain   . Muscle pain   . Muscle stiffness   . Neck stiffness   . Right hip pain   . Right knee pain   . Shortness of breath    with sudden awakening from sleep  . Tinnitus   . Trouble in sleeping   . Umbilical hernia   . Vitamin D deficiency   . Wheezing     PAST SURGICAL HISTORY: Past Surgical History:  Procedure Laterality Date  . COLONOSCOPY  2008  Dr. Gala Romney: 2 diminutive polyps in rectum (hyperplastic), otherwise normal  . COLONOSCOPY  2000   Per notes in epic, history of adenomas at outside facility  . COLONOSCOPY WITH PROPOFOL N/A 07/18/2017   Procedure: COLONOSCOPY WITH PROPOFOL;  Surgeon: Daneil Dolin, MD;  [facility];  Service: Endoscopy;  Laterality: N/A;  8:45  . HEMORRHOID SURGERY    . KNEE ARTHROSCOPY     Left  . KNEE ARTHROSCOPY     right  . PROSTATE SURGERY  2000   Prostate removal/Cancer    SOCIAL HISTORY: Social History   Tobacco Use  . Smoking status: Former Smoker    Types: Cigarettes    Last attempt to quit: 03/11/2002    Years since quitting: 16.4  . Smokeless tobacco: Never Used  Substance Use Topics  . Alcohol use: No    Frequency: Never  . Drug use: No    FAMILY HISTORY: Family History  Problem Relation Age of Onset  . Diabetes Mother   . Hypertension Mother   . Hyperlipidemia Mother   . Stroke Mother   . Heart disease Mother   . Depression  Mother   . Obesity Mother   . Eating disorder Mother   . Cancer Father   . Depression Father   . Anxiety disorder Father   . Liver disease Father   . Alcoholism Father   . Cancer Sister        Breast  . Colon cancer Neg Hx     ROS: Review of Systems  Constitutional: Negative for weight loss.  Gastrointestinal: Positive for constipation. Negative for nausea and vomiting.  Musculoskeletal: Negative for myalgias.  Endo/Heme/Allergies:       Negative for hypoglycemia    PHYSICAL EXAM: Blood pressure (!) 164/82, pulse 81, temperature 97.9 F (36.6 C), temperature source Oral, height 5\' 10"  (1.778 m), weight 298 lb (135.2 kg), SpO2 97 %. Body mass index is 42.76 kg/m. Physical Exam Vitals signs reviewed.  Constitutional:      Appearance: Normal appearance. He is well-developed. He is obese.  Cardiovascular:     Rate and Rhythm: Normal rate.  Pulmonary:     Effort: Pulmonary effort is normal.  Musculoskeletal: Normal range of motion.  Skin:    General: Skin is warm and dry.  Neurological:     Mental Status: He is alert and oriented to person, place, and time.  Psychiatric:        Mood and Affect: Mood normal.        Behavior: Behavior normal.     RECENT LABS AND TESTS: BMET    Component Value Date/Time   NA 139 07/15/2018 2200   NA 144 03/25/2018 1041   K 4.0 07/15/2018 2200   CL 106 07/15/2018 2200   CO2 25 07/15/2018 2200   GLUCOSE 166 (H) 07/15/2018 2200   BUN 11 07/15/2018 2200   BUN 12 03/25/2018 1041   CREATININE 0.89 07/15/2018 2200   CALCIUM 9.3 07/15/2018 2200   GFRNONAA >60 07/15/2018 2200   GFRAA >60 07/15/2018 2200   Lab Results  Component Value Date   HGBA1C 6.6 (H) 03/25/2018   No results found for: INSULIN CBC    Component Value Date/Time   WBC 10.1 07/15/2018 2200   RBC 5.09 07/15/2018 2200   HGB 14.5 07/15/2018 2200   HCT 45.9 07/15/2018 2200   PLT 209 07/15/2018 2200   MCV 90.2 07/15/2018 2200   MCH 28.5 07/15/2018 2200   MCHC  31.6 07/15/2018  2200   RDW 12.3 07/15/2018 2200   LYMPHSABS 2.8 07/12/2017 0850   MONOABS 0.3 07/12/2017 0850   EOSABS 0.1 07/12/2017 0850   BASOSABS 0.0 07/12/2017 0850   Iron/TIBC/Ferritin/ %Sat No results found for: IRON, TIBC, FERRITIN, IRONPCTSAT Lipid Panel     Component Value Date/Time   CHOL 170 03/25/2018 1041   TRIG 88 03/25/2018 1041   HDL 52 03/25/2018 1041   CHOLHDL 3.3 03/25/2018 1041   LDLCALC 100 (H) 03/25/2018 1041   Hepatic Function Panel     Component Value Date/Time   PROT 6.9 03/25/2018 1041   ALBUMIN 4.5 03/25/2018 1041   AST 15 03/25/2018 1041   ALT 16 03/25/2018 1041   ALKPHOS 92 03/25/2018 1041   BILITOT 0.3 03/25/2018 1041   No results found for: TSH   Ref. Range 03/25/2018 10:41  Vitamin D, 25-Hydroxy Latest Ref Range: 30.0 - 100.0 ng/mL 42.6     OBESITY BEHAVIORAL INTERVENTION VISIT  Today's visit was # 9   Starting weight: 294 lbs Starting date: 03/25/2018 Today's weight : 298 lbs Today's date: 08/18/2018 Total lbs lost to date: 0 At least 15 minutes were spent on discussing the following behavioral intervention visit.   ASK: We discussed the diagnosis of obesity with [name] today and Zackrey agreed to give [name] permission to discuss obesity behavioral modification therapy today.  ASSESS: Shia has the diagnosis of obesity and his BMI today is 42.76 Lyell is in the action stage of change   ADVISE: Graden was educated on the multiple health risks of obesity as well as the benefit of weight loss to improve his health. He was advised of the need for long term treatment and the importance of lifestyle modifications to improve his current health and to decrease his risk of future health problems.  AGREE: Multiple dietary modification options and treatment options were discussed and  Johnedward agreed to follow the recommendations documented in the above note.  ARRANGE: Ferdinand was educated on the importance of frequent visits to treat obesity as  outlined per CMS and USPSTF guidelines and agreed to schedule his next follow up appointment today.  Corey Skains, am acting as Location manager for [employer]. Owens Shark, DO  I have reviewed the above documentation for accuracy and completeness, and I agree with the above. -Jearld Lesch, DO

## 2018-09-15 ENCOUNTER — Ambulatory Visit (INDEPENDENT_AMBULATORY_CARE_PROVIDER_SITE_OTHER): Payer: Medicare Other | Admitting: Bariatrics

## 2018-09-15 ENCOUNTER — Encounter (INDEPENDENT_AMBULATORY_CARE_PROVIDER_SITE_OTHER): Payer: Self-pay | Admitting: Bariatrics

## 2018-09-15 VITALS — BP 156/79 | HR 84 | Temp 98.1°F | Ht 70.0 in | Wt 297.0 lb

## 2018-09-15 DIAGNOSIS — E119 Type 2 diabetes mellitus without complications: Secondary | ICD-10-CM | POA: Diagnosis not present

## 2018-09-15 DIAGNOSIS — Z6841 Body Mass Index (BMI) 40.0 and over, adult: Secondary | ICD-10-CM | POA: Diagnosis not present

## 2018-09-15 DIAGNOSIS — I10 Essential (primary) hypertension: Secondary | ICD-10-CM | POA: Diagnosis not present

## 2018-09-16 NOTE — Progress Notes (Signed)
Office: [phone]  /  Fax: [phone]   HPI:   Chief Complaint: OBESITY Travis Blanchard is here to discuss his progress with his obesity treatment plan. He is on the Category 3 plan and is following his eating plan approximately 50 % of the time. He states he is exercising 0 minutes 0 times per week. Travis Blanchard has been referred to bariatric surgery. He is trying to lose weight , but he has struggled with weight loss since he started the program. His weight is 297 lb (134.7 kg) today and has had a weight loss of 1 pound over a period of 4 weeks since his last visit. He has gained 3 lbs since starting treatment with [name].  Hypertension Travis Blanchard is a 73 y.o. male with hypertension. He is taking Lisinopril. [name] denies chest pain. He is working weight loss to help control his blood pressure with the goal of decreasing his risk of heart attack and stroke. Travis Blanchard blood pressure is not well controlled.  Diabetes II Travis Blanchard has a diagnosis of diabetes type II. He is currently taking Metformin. Travis Blanchard states fasting BGs range between 100 and 110 and 2 hour post prandial BGs are 140. Last A1c was at 6.6 He has been working on intensive lifestyle modifications including diet, exercise, and weight loss to help control his blood glucose levels.  ASSESSMENT AND PLAN:  Essential hypertension  Type 2 diabetes mellitus without complication, without long-term current use of insulin (HCC)  Class 3 severe obesity with serious comorbidity and body mass index (BMI) of 40.0 to 44.9 in adult, unspecified obesity type ([place])  PLAN:  Hypertension We discussed sodium restriction, decreasing sodium, limiting sodium products (popcorn), working on healthy weight loss, and a regular exercise program as the means to achieve improved blood pressure control. Ladamien agreed with this plan and he agreed to follow up as directed. We will continue to monitor his blood pressure as well as his progress with the above lifestyle  modifications. He will continue his medications as prescribed and will watch for signs of hypotension as he continues his lifestyle modifications. Blayze will discuss this with his PCP.  Diabetes II Travis Blanchard has been given extensive diabetes education by myself today including ideal fasting and post-prandial blood glucose readings, individual ideal Hgb A1c goals and hypoglycemia prevention. We discussed the importance of good blood sugar control to decrease the likelihood of diabetic complications such as nephropathy, neuropathy, limb loss, blindness, coronary artery disease, and death. We discussed the importance of intensive lifestyle modification including diet, exercise and weight loss as the first line treatment for diabetes. Hershel agrees to continue Metformin and follow up at the agreed upon time.  Obesity Travis Blanchard is currently in the action stage of change. As such, his goal is to continue with weight loss efforts He has agreed to follow the Category 3 plan Travis Blanchard has been instructed to work up to a goal of 150 minutes of combined cardio and strengthening exercise per week for weight loss and overall health benefits. We discussed the following Behavioral Modification Strategies today: increase H2O intake, no skipping meals, keeping healthy foods in the home, increasing lean protein intake, decreasing simple carbohydrates, increasing vegetables, decrease eating out, work on meal planning and easy cooking plans and decrease liquid calories Travis Blanchard will see the bariatric surgeon on 09/24/18.  Travis Blanchard has agreed to follow up with our clinic in 6 weeks. He was informed of the importance of frequent follow up visits to maximize his success with intensive  lifestyle modifications for his multiple health conditions.  ALLERGIES: Allergies  Allergen Reactions  . Penicillins Hives, Itching and Other (See Comments)    Has patient had a PCN reaction causing immediate rash, facial/tongue/throat swelling, SOB or  lightheadedness with hypotension: Yes Has patient had a PCN reaction causing severe rash involving mucus membranes or skin necrosis: No Has patient had a PCN reaction that required hospitalization: No Has patient had a PCN reaction occurring within the last 10 years: No If all of the above answers are "NO", then may proceed with Cephalosporin use.     MEDICATIONS: Current Outpatient Medications on File Prior to Visit  Medication Sig Dispense Refill  . acetaminophen (TYLENOL) 500 MG tablet Take 1,000 mg by mouth every 6 (six) hours as needed.    [name] atorvastatin (LIPITOR) 20 MG tablet Take 20 mg by mouth daily.     . Cholecalciferol (VITAMIN D3) 1000 units CAPS Take by mouth.    . fluticasone (FLONASE) 50 MCG/ACT nasal spray Place into both nostrils daily.    [name] ipratropium-albuterol (DUONEB) 0.5-2.5 (3) MG/3ML SOLN Take 3 mLs by nebulization.    [name] lisinopril (PRINIVIL,ZESTRIL) 30 MG tablet Take 30 mg by mouth daily.     [name] loratadine (CLARITIN) 10 MG tablet Take 10 mg by mouth daily.    . metFORMIN (GLUCOPHAGE) 500 MG tablet Take 500 mg by mouth 2 (two) times daily with a meal.     . naproxen sodium (ALEVE) 220 MG tablet Take 220 mg by mouth at bedtime.    . psyllium (METAMUCIL) 58.6 % powder Take 1 packet by mouth at bedtime. SUGAR FREE ORANGE FLAVORED    . VENTOLIN HFA 108 (90 BASE) MCG/ACT inhaler Inhale 1 puff into the lungs every 6 (six) hours as needed for wheezing or shortness of breath.     . vitamin B-12 (CYANOCOBALAMIN) 1000 MCG tablet Take 1,000 mcg by mouth daily.    [name] zolpidem (AMBIEN) 5 MG tablet Take 5 mg by mouth at bedtime.      No current facility-administered medications on file prior to visit.     PAST MEDICAL HISTORY: Past Medical History:  Diagnosis Date  . Arthritis   . Asthma   . B12 deficiency   . Cancer [facility])    Prostate  . COPD (chronic obstructive pulmonary disease) ([place])   . Cough   . Decreased hearing   . Diabetes mellitus without complication ([place])     . Floaters in visual field   . Frequent urination   . Hoarseness   . Hyperlipidemia   . Hypertension   . Joint pain   . Lower back pain   . Muscle pain   . Muscle stiffness   . Neck stiffness   . Right hip pain   . Right knee pain   . Shortness of breath    with sudden awakening from sleep  . Tinnitus   . Trouble in sleeping   . Umbilical hernia   . Vitamin D deficiency   . Wheezing     PAST SURGICAL HISTORY: Past Surgical History:  Procedure Laterality Date  . COLONOSCOPY  2008   Dr. Gala Romney: 2 diminutive polyps in rectum (hyperplastic), otherwise normal  . COLONOSCOPY  2000   Per notes in epic, history of adenomas at outside facility  . COLONOSCOPY WITH PROPOFOL N/A 07/18/2017   Procedure: COLONOSCOPY WITH PROPOFOL;  Surgeon: Daneil Dolin, MD;  [facility];  Service: Endoscopy;  Laterality: N/A;  8:45  .  HEMORRHOID SURGERY    . KNEE ARTHROSCOPY     Left  . KNEE ARTHROSCOPY     right  . PROSTATE SURGERY  2000   Prostate removal/Cancer    SOCIAL HISTORY: Social History   Tobacco Use  . Smoking status: Former Smoker    Types: Cigarettes    Last attempt to quit: 03/11/2002    Years since quitting: 16.5  . Smokeless tobacco: Never Used  Substance Use Topics  . Alcohol use: No    Frequency: Never  . Drug use: No    FAMILY HISTORY: Family History  Problem Relation Age of Onset  . Diabetes Mother   . Hypertension Mother   . Hyperlipidemia Mother   . Stroke Mother   . Heart disease Mother   . Depression Mother   . Obesity Mother   . Eating disorder Mother   . Cancer Father   . Depression Father   . Anxiety disorder Father   . Liver disease Father   . Alcoholism Father   . Cancer Sister        Breast  . Colon cancer Neg Hx     ROS: Review of Systems  Constitutional: Positive for weight loss.  Cardiovascular: Negative for chest pain.    PHYSICAL EXAM: Blood pressure (!) 156/79, pulse 84, temperature 98.1 F (36.7 C), temperature  source Oral, height 5\' 10"  (1.778 m), weight 297 lb (134.7 kg), SpO2 99 %. Body mass index is 42.62 kg/m. Physical Exam Vitals signs reviewed.  Constitutional:      Appearance: Normal appearance. He is well-developed. He is obese.  Cardiovascular:     Rate and Rhythm: Normal rate.  Pulmonary:     Effort: Pulmonary effort is normal.  Musculoskeletal: Normal range of motion.  Skin:    General: Skin is warm and dry.  Neurological:     Mental Status: He is alert and oriented to person, place, and time.  Psychiatric:        Mood and Affect: Mood normal.        Behavior: Behavior normal.     RECENT LABS AND TESTS: BMET    Component Value Date/Time   NA 139 07/15/2018 2200   NA 144 03/25/2018 1041   K 4.0 07/15/2018 2200   CL 106 07/15/2018 2200   CO2 25 07/15/2018 2200   GLUCOSE 166 (H) 07/15/2018 2200   BUN 11 07/15/2018 2200   BUN 12 03/25/2018 1041   CREATININE 0.89 07/15/2018 2200   CALCIUM 9.3 07/15/2018 2200   GFRNONAA >60 07/15/2018 2200   GFRAA >60 07/15/2018 2200   Lab Results  Component Value Date   HGBA1C 6.6 (H) 03/25/2018   No results found for: INSULIN CBC    Component Value Date/Time   WBC 10.1 07/15/2018 2200   RBC 5.09 07/15/2018 2200   HGB 14.5 07/15/2018 2200   HCT 45.9 07/15/2018 2200   PLT 209 07/15/2018 2200   MCV 90.2 07/15/2018 2200   MCH 28.5 07/15/2018 2200   MCHC 31.6 07/15/2018 2200   RDW 12.3 07/15/2018 2200   LYMPHSABS 2.8 07/12/2017 0850   MONOABS 0.3 07/12/2017 0850   EOSABS 0.1 07/12/2017 0850   BASOSABS 0.0 07/12/2017 0850   Iron/TIBC/Ferritin/ %Sat No results found for: IRON, TIBC, FERRITIN, IRONPCTSAT Lipid Panel     Component Value Date/Time   CHOL 170 03/25/2018 1041   TRIG 88 03/25/2018 1041   HDL 52 03/25/2018 1041   CHOLHDL 3.3 03/25/2018 1041   LDLCALC 100 (H) 03/25/2018  1041   Hepatic Function Panel     Component Value Date/Time   PROT 6.9 03/25/2018 1041   ALBUMIN 4.5 03/25/2018 1041   AST 15 03/25/2018  1041   ALT 16 03/25/2018 1041   ALKPHOS 92 03/25/2018 1041   BILITOT 0.3 03/25/2018 1041   No results found for: TSH   Ref. Range 03/25/2018 10:41  Vitamin D, 25-Hydroxy Latest Ref Range: 30.0 - 100.0 ng/mL 42.6     OBESITY BEHAVIORAL INTERVENTION VISIT  Today's visit was # 10   Starting weight: 294 lbs Starting date: 03/25/2018 Today's weight : 297 lbs Today's date: 09/15/2018 Total lbs lost to date: 0 At least 15 minutes were spent on discussing the following behavioral intervention visit.    09/15/2018  Height 5\' 10"  (1.778 m)  Weight 297 lb (134.7 kg)  BMI (Calculated) 42.61  BLOOD PRESSURE - SYSTOLIC 622  BLOOD PRESSURE - DIASTOLIC 79   Body Fat % 63.3 %  Total Body Water (lbs) 120.8 lbs    ASK: We discussed the diagnosis of obesity with [name] today and Taishaun agreed to give [name] permission to discuss obesity behavioral modification therapy today.  ASSESS: Johnwesley has the diagnosis of obesity and his BMI today is 42.61 Tamim is in the action stage of change   ADVISE: Jlynn was educated on the multiple health risks of obesity as well as the benefit of weight loss to improve his health. He was advised of the need for long term treatment and the importance of lifestyle modifications to improve his current health and to decrease his risk of future health problems.  AGREE: Multiple dietary modification options and treatment options were discussed and  Kee agreed to follow the recommendations documented in the above note.  ARRANGE: Yasmin was educated on the importance of frequent visits to treat obesity as outlined per CMS and USPSTF guidelines and agreed to schedule his next follow up appointment today.  Corey Skains, am acting as Location manager for [employer]. Owens Shark, DO  I have reviewed the above documentation for accuracy and completeness, and I agree with the above. -Jearld Lesch, DO

## 2018-09-18 ENCOUNTER — Ambulatory Visit (INDEPENDENT_AMBULATORY_CARE_PROVIDER_SITE_OTHER): Payer: Self-pay | Admitting: Bariatrics

## 2018-09-24 DIAGNOSIS — I10 Essential (primary) hypertension: Secondary | ICD-10-CM | POA: Diagnosis not present

## 2018-09-24 DIAGNOSIS — R5383 Other fatigue: Secondary | ICD-10-CM | POA: Diagnosis not present

## 2018-09-24 DIAGNOSIS — M869 Osteomyelitis, unspecified: Secondary | ICD-10-CM | POA: Diagnosis not present

## 2018-09-24 DIAGNOSIS — M170 Bilateral primary osteoarthritis of knee: Secondary | ICD-10-CM | POA: Diagnosis not present

## 2018-09-24 DIAGNOSIS — E669 Obesity, unspecified: Secondary | ICD-10-CM | POA: Diagnosis not present

## 2018-09-24 DIAGNOSIS — K432 Incisional hernia without obstruction or gangrene: Secondary | ICD-10-CM | POA: Diagnosis not present

## 2018-09-24 DIAGNOSIS — E1169 Type 2 diabetes mellitus with other specified complication: Secondary | ICD-10-CM | POA: Diagnosis not present

## 2018-09-29 ENCOUNTER — Other Ambulatory Visit: Payer: Self-pay | Admitting: General Surgery

## 2018-09-29 DIAGNOSIS — R5383 Other fatigue: Secondary | ICD-10-CM | POA: Diagnosis not present

## 2018-09-30 ENCOUNTER — Encounter (INDEPENDENT_AMBULATORY_CARE_PROVIDER_SITE_OTHER): Payer: Self-pay

## 2018-10-01 ENCOUNTER — Telehealth: Payer: Self-pay | Admitting: Gastroenterology

## 2018-10-01 NOTE — Telephone Encounter (Signed)
Please put patient on recall for appointment in the summer, non-urgent. We need to follow back with him regarding status of hernia and still needs complete colonoscopy at some point in the future as [facility] unable to advance scope past transverse colon in setting of significantly excessive looping.

## 2018-10-02 ENCOUNTER — Encounter: Payer: Self-pay | Admitting: Internal Medicine

## 2018-10-02 NOTE — Telephone Encounter (Signed)
SCHEDULED PATIENT FOR July AND SENT LETTER

## 2018-10-27 ENCOUNTER — Other Ambulatory Visit: Payer: Self-pay

## 2018-10-27 ENCOUNTER — Ambulatory Visit (INDEPENDENT_AMBULATORY_CARE_PROVIDER_SITE_OTHER): Payer: Medicare Other | Admitting: Bariatrics

## 2018-10-27 ENCOUNTER — Encounter (INDEPENDENT_AMBULATORY_CARE_PROVIDER_SITE_OTHER): Payer: Self-pay | Admitting: Bariatrics

## 2018-10-27 DIAGNOSIS — E119 Type 2 diabetes mellitus without complications: Secondary | ICD-10-CM | POA: Diagnosis not present

## 2018-10-27 DIAGNOSIS — E66813 Obesity, class 3: Secondary | ICD-10-CM

## 2018-10-27 DIAGNOSIS — Z6841 Body Mass Index (BMI) 40.0 and over, adult: Secondary | ICD-10-CM | POA: Diagnosis not present

## 2018-10-27 DIAGNOSIS — I10 Essential (primary) hypertension: Secondary | ICD-10-CM | POA: Diagnosis not present

## 2018-10-29 NOTE — Progress Notes (Signed)
Office: [phone]  /  Fax: [phone] TeleHealth Visit:  Travis Blanchard has verbally consented to this TeleHealth visit today. The patient is located at home, the provider is located at the [employer] [facility] office. The participants in this visit include the listed provider and patient and any and all parties involved. The visit was conducted today via telephone ( 15 minutes ) Travis Blanchard was unable to use realtime audiovisual technology today and the telehealth visit was conducted via telephone.  HPI:   Chief Complaint: OBESITY Travis Blanchard is here to discuss his progress with his obesity treatment plan. He is on the Category 3 plan and is following his eating plan approximately 20 % of the time. He states he is walking and doing stair steps 40 minutes 3 to 4 times per week. Travis Blanchard states that he has gained a couple of pounds. We were unable to weigh the patient today for this TeleHealth visit. He feels as if he has gained weight since his last visit.   Hypertension Travis Blanchard is a 73 y.o. male with hypertension. He is taking Lisinopril. His systolic blood pressure at home is between 115 and 361 and systolic blood pressure is in the 70's. Travis Blanchard denies chest pain or shortness of breath on exertion. He is working weight loss to help control his blood pressure with the goal of decreasing his risk of heart attack and stroke. Travis Blanchard blood pressure is currently controlled.  Diabetes II Travis Blanchard has a diagnosis of diabetes type II. Travis Blanchard states fasting BGs range between 112 and 130 and he denies any hypoglycemic episodes. Last A1c was at 6.6 He has been working on intensive lifestyle modifications including diet, exercise, and weight loss to help control his blood glucose levels.  ASSESSMENT AND PLAN:  Essential hypertension  Type 2 diabetes mellitus without complication, without long-term current use of insulin (HCC)  Class 3 severe obesity with serious comorbidity and body mass index  (BMI) of 40.0 to 44.9 in adult, unspecified obesity type ([place])  PLAN:  Hypertension We discussed sodium restriction, working on healthy weight loss, and a regular exercise program as the means to achieve improved blood pressure control. Travis Blanchard agreed with this plan and agreed to follow up as directed. We will continue to monitor his blood pressure as well as his progress with the above lifestyle modifications. He will continue his medications as prescribed and will watch for signs of hypotension as he continues his lifestyle modifications.  Diabetes II Travis Blanchard has been given extensive diabetes education by myself today including ideal fasting and post-prandial blood glucose readings, individual ideal Hgb A1c goals and hypoglycemia prevention. We discussed the importance of good blood sugar control to decrease the likelihood of diabetic complications such as nephropathy, neuropathy, limb loss, blindness, coronary artery disease, and death. We discussed the importance of intensive lifestyle modification including diet, exercise and weight loss as the first line treatment for diabetes. Travis Blanchard will continue metformin and will follow up at the agreed upon time.  Obesity Travis Blanchard is currently in the action stage of change. As such, his goal is to continue with weight loss efforts He has agreed to follow the Category 3 plan Travis Blanchard will continue exercise (walking and yard work) for weight loss and overall health benefits. We discussed the following Behavioral Modification Strategies today: increase H2O intake, no skipping meals, better snacking choices, increasing lean protein intake, decreasing simple carbohydrates, increasing vegetables, decreasing sodium intake, decrease eating out, work on meal planning and easy cooking  plans and ways to avoid night time snacking Travis Blanchard will weigh himself at home before each visit.  Travis Blanchard has agreed to follow up with our clinic in 2 weeks. He was informed of the importance of frequent  follow up visits to maximize his success with intensive lifestyle modifications for his multiple health conditions.  ALLERGIES: Allergies  Allergen Reactions  . Penicillins Hives, Itching and Other (See Comments)    Has patient had a PCN reaction causing immediate rash, facial/tongue/throat swelling, SOB or lightheadedness with hypotension: Yes Has patient had a PCN reaction causing severe rash involving mucus membranes or skin necrosis: No Has patient had a PCN reaction that required hospitalization: No Has patient had a PCN reaction occurring within the last 10 years: No If all of the above answers are "NO", then may proceed with Cephalosporin use.     MEDICATIONS: Current Outpatient Medications on File Prior to Visit  Medication Sig Dispense Refill  . acetaminophen (TYLENOL) 500 MG tablet Take 1,000 mg by mouth every 6 (six) hours as needed.    [name] atorvastatin (LIPITOR) 20 MG tablet Take 20 mg by mouth daily.     . Cholecalciferol (VITAMIN D3) 1000 units CAPS Take by mouth.    . fluticasone (FLONASE) 50 MCG/ACT nasal spray Place into both nostrils daily.    [name] ipratropium-albuterol (DUONEB) 0.5-2.5 (3) MG/3ML SOLN Take 3 mLs by nebulization.    [name] lisinopril (PRINIVIL,ZESTRIL) 30 MG tablet Take 30 mg by mouth daily.     [name] loratadine (CLARITIN) 10 MG tablet Take 10 mg by mouth daily.    . metFORMIN (GLUCOPHAGE) 500 MG tablet Take 500 mg by mouth 2 (two) times daily with a meal.     . naproxen sodium (ALEVE) 220 MG tablet Take 220 mg by mouth at bedtime.    . psyllium (METAMUCIL) 58.6 % powder Take 1 packet by mouth at bedtime. SUGAR FREE ORANGE FLAVORED    . VENTOLIN HFA 108 (90 BASE) MCG/ACT inhaler Inhale 1 puff into the lungs every 6 (six) hours as needed for wheezing or shortness of breath.     . vitamin B-12 (CYANOCOBALAMIN) 1000 MCG tablet Take 1,000 mcg by mouth daily.    [name] zolpidem (AMBIEN) 5 MG tablet Take 5 mg by mouth at bedtime.      No current facility-administered  medications on file prior to visit.     PAST MEDICAL HISTORY: Past Medical History:  Diagnosis Date  . Arthritis   . Asthma   . B12 deficiency   . Cancer [facility])    Prostate  . COPD (chronic obstructive pulmonary disease) ([place])   . Cough   . Decreased hearing   . Diabetes mellitus without complication ([place])   . Floaters in visual field   . Frequent urination   . Hoarseness   . Hyperlipidemia   . Hypertension   . Joint pain   . Lower back pain   . Muscle pain   . Muscle stiffness   . Neck stiffness   . Right hip pain   . Right knee pain   . Shortness of breath    with sudden awakening from sleep  . Tinnitus   . Trouble in sleeping   . Umbilical hernia   . Vitamin D deficiency   . Wheezing     PAST SURGICAL HISTORY: Past Surgical History:  Procedure Laterality Date  . COLONOSCOPY  2008   Dr. Gala Romney: 2 diminutive polyps in rectum (hyperplastic), otherwise normal  . COLONOSCOPY  2000  Per notes in epic, history of adenomas at outside facility  . COLONOSCOPY WITH PROPOFOL N/A 07/18/2017   Procedure: COLONOSCOPY WITH PROPOFOL;  Surgeon: Daneil Dolin, MD;  [facility];  Service: Endoscopy;  Laterality: N/A;  8:45  . HEMORRHOID SURGERY    . KNEE ARTHROSCOPY     Left  . KNEE ARTHROSCOPY     right  . PROSTATE SURGERY  2000   Prostate removal/Cancer    SOCIAL HISTORY: Social History   Tobacco Use  . Smoking status: Former Smoker    Types: Cigarettes    Last attempt to quit: 03/11/2002    Years since quitting: 16.6  . Smokeless tobacco: Never Used  Substance Use Topics  . Alcohol use: No    Frequency: Never  . Drug use: No    FAMILY HISTORY: Family History  Problem Relation Age of Onset  . Diabetes Mother   . Hypertension Mother   . Hyperlipidemia Mother   . Stroke Mother   . Heart disease Mother   . Depression Mother   . Obesity Mother   . Eating disorder Mother   . Cancer Father   . Depression Father   . Anxiety disorder Father   .  Liver disease Father   . Alcoholism Father   . Cancer Sister        Breast  . Colon cancer Neg Hx     ROS: Review of Systems  Constitutional: Negative for weight loss.  Respiratory: Negative for shortness of breath (on exertion).   Cardiovascular: Negative for chest pain.  Endo/Heme/Allergies:       Negative for hypoglycemia    PHYSICAL EXAM: Pt in no acute distress  RECENT LABS AND TESTS: BMET    Component Value Date/Time   NA 139 07/15/2018 2200   NA 144 03/25/2018 1041   K 4.0 07/15/2018 2200   CL 106 07/15/2018 2200   CO2 25 07/15/2018 2200   GLUCOSE 166 (H) 07/15/2018 2200   BUN 11 07/15/2018 2200   BUN 12 03/25/2018 1041   CREATININE 0.89 07/15/2018 2200   CALCIUM 9.3 07/15/2018 2200   GFRNONAA >60 07/15/2018 2200   GFRAA >60 07/15/2018 2200   Lab Results  Component Value Date   HGBA1C 6.6 (H) 03/25/2018   No results found for: INSULIN CBC    Component Value Date/Time   WBC 10.1 07/15/2018 2200   RBC 5.09 07/15/2018 2200   HGB 14.5 07/15/2018 2200   HCT 45.9 07/15/2018 2200   PLT 209 07/15/2018 2200   MCV 90.2 07/15/2018 2200   MCH 28.5 07/15/2018 2200   MCHC 31.6 07/15/2018 2200   RDW 12.3 07/15/2018 2200   LYMPHSABS 2.8 07/12/2017 0850   MONOABS 0.3 07/12/2017 0850   EOSABS 0.1 07/12/2017 0850   BASOSABS 0.0 07/12/2017 0850   Iron/TIBC/Ferritin/ %Sat No results found for: IRON, TIBC, FERRITIN, IRONPCTSAT Lipid Panel     Component Value Date/Time   CHOL 170 03/25/2018 1041   TRIG 88 03/25/2018 1041   HDL 52 03/25/2018 1041   CHOLHDL 3.3 03/25/2018 1041   LDLCALC 100 (H) 03/25/2018 1041   Hepatic Function Panel     Component Value Date/Time   PROT 6.9 03/25/2018 1041   ALBUMIN 4.5 03/25/2018 1041   AST 15 03/25/2018 1041   ALT 16 03/25/2018 1041   ALKPHOS 92 03/25/2018 1041   BILITOT 0.3 03/25/2018 1041   No results found for: TSH   Ref. Range 03/25/2018 10:41  Vitamin D, 25-Hydroxy Latest Ref  Range: 30.0 - 100.0 ng/mL 42.6     I, Doreene Nest, am acting as Location manager for [employer]. Owens Shark, DO  I have reviewed the above documentation for accuracy and completeness, and I agree with the above. -Jearld Lesch, DO

## 2018-10-30 DIAGNOSIS — E119 Type 2 diabetes mellitus without complications: Secondary | ICD-10-CM | POA: Diagnosis not present

## 2018-10-30 DIAGNOSIS — I10 Essential (primary) hypertension: Secondary | ICD-10-CM | POA: Diagnosis not present

## 2018-11-05 ENCOUNTER — Encounter: Payer: Medicare Other | Attending: General Surgery | Admitting: Dietician

## 2018-11-05 ENCOUNTER — Encounter: Payer: Self-pay | Admitting: Dietician

## 2018-11-05 ENCOUNTER — Other Ambulatory Visit: Payer: Self-pay

## 2018-11-05 VITALS — Ht 70.5 in | Wt 307.0 lb

## 2018-11-05 DIAGNOSIS — R5383 Other fatigue: Secondary | ICD-10-CM | POA: Insufficient documentation

## 2018-11-05 DIAGNOSIS — E119 Type 2 diabetes mellitus without complications: Secondary | ICD-10-CM | POA: Insufficient documentation

## 2018-11-05 DIAGNOSIS — I10 Essential (primary) hypertension: Secondary | ICD-10-CM | POA: Diagnosis not present

## 2018-11-05 DIAGNOSIS — E669 Obesity, unspecified: Secondary | ICD-10-CM

## 2018-11-05 NOTE — Patient Instructions (Signed)
Begin working through the [employer] discussed today. You've already accomplished a lot of these goals!   For example, maybe start by looking up http://vang.com/ to use as a way to track your food intake. This way you will be able to tell how much protein you are eating each day to see if you reach your goal of 80 grams a day.   I will ask about your supervised weight loss visit requirements.   See you next time!

## 2018-11-05 NOTE — Progress Notes (Addendum)
Bariatric Pre-Op Nutrition Assessment Medical Nutrition Therapy  Appt Start Time: 2:15pm  End time: 3:45pm  Patient was seen on 11/05/2018 for Pre-Operative Nutrition Assessment. Assessment and letter of approval faxed to [facility] Bariatric Surgery Program coordinator on 11/05/2018.   Planned surgery: Sleeve Gastrectomy  Pt expectation of surgery: to lose at least enough weight to qualify for hip replacement and hernia repair surgeries, to come off medications, to help with other ailments  Pt expectation of dietitian: none stated   Anthropometrics  Start weight at [facility]: 307 lbs (date: 11/05/2018) Height: 70.5 in BMI: 43.4 kg/m2    Clinical  Medical Hx: obesity, anxiety, arthritis, lung disease, diabetes, gastric ulcer, hemorrhoids, HTN, hypercholesterolemia, prostate cancer Surgeries: colon polyp removal, hemorrhoidectomy, knee, prostate removal  Medications: atorvastatin calcium, lisinopril, loratadine, metformin, zolpidem tartrate, Aleve, Flonase, DuoNeb Allergies: penicillin   Psychosocial/Lifestyle Pt's wife, [name], is also planning to have the sleeve gastrectomy surgery and they attended their assessment together today. Pt is a former smoker. 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 He states he would like to lose weight to especially help with other ailments such as joint pain. Pt is personable and is excited for surgery.  24-Hr Dietary Recall First Meal: Special K + raisin bran + strawberries + blueberries + whole milk  Snack: oranges or plums Second Meal: usually skips  Snack: crackers Third Meal: meatloaf + bread + salad w/ 1,000 island & crackers  Snack: peach cobbler  Beverages: water + coffee w/ cream + whole milk + beer  Food & Nutrition Related Hx Dietary Hx: Pt states he and his wife typically do not eat lunch. Since retiring, typical schedule involves waking up around 11am and having breakfast, maybe having snacks throughout  the day, eating dinner, and going to bed late (around midnight.)  Pt states he likes salads with lots of vegetables and ranch dressing or 1,000 island dressing. Drinks about a 6 pack of beer/week.   Supplements: vitamin D3 and B12 GI / Other Notable Symptoms: none (takes metamucil)   Physical Activity  Current average weekly physical activity: ADLs, walking, yard work   Estimated Energy Needs Calories: 2000 Carbohydrate: 225g Protein: 150g Fat: 55g  Pre-Op Goals Reviewed with the Patient . Track food and beverage intake (try MyFitness Pal or the Baritastic app) . Make healthy food choices while monitoring portion sizes . Avoid concentrated sugars and fried foods . Keep fat & sugar in the single digits per serving on food labels . Practice CHEWING your food (aim for applesauce consistency) . Practice not drinking 15 minutes before, during, and 30 minutes after each meal and snack . Avoid all carbonated beverages (ex: soda, sparkling beverages)  . Limit caffeinated beverages (ex: coffee, tea, energy drinks) . Avoid all sugar-sweetened beverages (ex: regular soda, sports drinks)  . Avoid alcohol  . Consume 3 meals per day or try to eat every 3-5 hours . Make a list of non-food related activities . Aim for 64-100 ounces of FLUID daily (with at least half of fluid intake being plain water)  . Aim for at least 60-80 grams of PROTEIN daily . Look for a liquid protein source that contains ?15 g protein and ?5 g carbohydrate (ex: shakes, drinks, shots) . Physical activity is an important part of a healthy lifestyle so keep it moving! The goal is to reach 150 minutes of exercise per week, including cardiovascular and weight baring activity.  Handouts Provided Include  .  Bariatric Surgery handouts (Nutrition Visits, Pre-Op Goals, Protein Shakes, Vitamins & Minerals, Support Group 2020 Schedule)  Learning Style & Readiness for Change Teaching method utilized: Visual & Auditory  Demonstrated  degree of understanding via: Teach Back  Barriers to learning/adherence to lifestyle change: None Identified   Next Steps Supervised Weight Loss (SWL) Visits Needed: 6 (*According to pt's referral, he has/will be completing some/all his supervised visits with [facility])  Patient is to return to [facility] in 1 month for 1st SWL Visit unless another plan is coordinated with [facility].  11/06/2018 Addendum: I spoke with a provider at the [facility] to discuss how the program can help meet the SWL requirements for this patient. Based on my understanding of the program, I believe the patient's visits here can fulfill the SWL visits needed prior to surgery as long as they are approved by pt's insurance specifications, including the number of visits needed as well as how many days apart the visits need to be (typically visits must be 30 days apart.) Based on information I received, typical time between visits is 2 weeks at [facility]. At this time, we will plan on the patient completing their 6 SWL visits at [facility].    Patient is to call [facility] to enroll in Pre-Op Class (>2 weeks before surgery) and Post-Op Class (2 weeks after surgery) for further nutrition education when surgery date is scheduled.

## 2018-11-10 ENCOUNTER — Other Ambulatory Visit: Payer: Self-pay

## 2018-11-10 ENCOUNTER — Encounter (INDEPENDENT_AMBULATORY_CARE_PROVIDER_SITE_OTHER): Payer: Self-pay | Admitting: Bariatrics

## 2018-11-10 ENCOUNTER — Ambulatory Visit (INDEPENDENT_AMBULATORY_CARE_PROVIDER_SITE_OTHER): Payer: Medicare Other | Admitting: Bariatrics

## 2018-11-10 DIAGNOSIS — Z6841 Body Mass Index (BMI) 40.0 and over, adult: Secondary | ICD-10-CM | POA: Diagnosis not present

## 2018-11-10 DIAGNOSIS — E119 Type 2 diabetes mellitus without complications: Secondary | ICD-10-CM

## 2018-11-10 DIAGNOSIS — I10 Essential (primary) hypertension: Secondary | ICD-10-CM | POA: Diagnosis not present

## 2018-11-10 DIAGNOSIS — J449 Chronic obstructive pulmonary disease, unspecified: Secondary | ICD-10-CM | POA: Diagnosis not present

## 2018-11-10 MED ORDER — ALBUTEROL SULFATE HFA 108 (90 BASE) MCG/ACT IN AERS: 1.0000 | INHALATION_SPRAY | Freq: Four times a day (QID) | RESPIRATORY_TRACT | 0 refills | Status: AC | PRN

## 2018-11-11 ENCOUNTER — Ambulatory Visit (HOSPITAL_COMMUNITY): Payer: PRIVATE HEALTH INSURANCE

## 2018-11-11 NOTE — Progress Notes (Signed)
Office: [phone]  /  Fax: [phone] TeleHealth Visit:  Travis Blanchard has verbally consented to this TeleHealth visit today. The patient is located at home, the provider is located at the [facility] office. The participants in this visit include the listed provider and patient and any and all parties involved. The visit was conducted today via telephone. Travis Blanchard was unable to use realtime audiovisual technology today and the telehealth visit was conducted via telephone.  HPI:   Chief Complaint: OBESITY Travis Blanchard is here to discuss his progress with his obesity treatment plan. He is on the Category 3 plan and is following his eating plan approximately 30 % of the time. He states he is exercising 0 minutes 0 times per week. Travis Blanchard states that he has gained 10 pounds (weight 305 lbs last week). He continues to plan for bariatric surgery in the future. We were unable to weigh the patient today for this TeleHealth visit. He feels as if he has gained weight since his last visit. He has gained 11 lbs since starting treatment with [name].  Hypertension Travis Blanchard is a 73 y.o. male with hypertension. His blood pressure is 135/85 Travis Blanchard denies chest pain. He is working weight loss to help control his blood pressure with the goal of decreasing his risk of heart attack and stroke. [name] blood pressure is reasonably well controlled.  Diabetes II Travis Blanchard has a diagnosis of diabetes type II. Travis Blanchard states fasting BGs range between 125 and 135 and he denies any hypoglycemic episodes. Last A1c was at 6.6 He has been working on intensive lifestyle modifications including diet, exercise, and weight loss to help control his blood glucose levels.  COPD (chronic obstructive pulmonary disease) Travis Blanchard has a diagnosis of COPD. He has occasional shortness of breath with exertion.  ASSESSMENT AND PLAN:  Essential hypertension  Type 2 diabetes mellitus without complication, without long-term current  use of insulin (HCC)  Chronic obstructive pulmonary disease, unspecified COPD type ([place]) - Plan: albuterol (VENTOLIN HFA) 108 (90 Base) MCG/ACT inhaler  Class 3 severe obesity with serious comorbidity and body mass index (BMI) of 40.0 to 44.9 in adult, unspecified obesity type ([place])  PLAN:  Hypertension We discussed sodium restriction, working on healthy weight loss, and a regular exercise program as the means to achieve improved blood pressure control. [name] agreed with this plan and agreed to follow up as directed. We will continue to monitor his blood pressure as well as his progress with the above lifestyle modifications. He will continue his medications as prescribed and will watch for signs of hypotension as he continues his lifestyle modifications.  Diabetes II Travis Blanchard has been given extensive diabetes education by myself today including ideal fasting and post-prandial blood glucose readings, individual ideal Hgb A1c goals and hypoglycemia prevention. We discussed the importance of good blood sugar control to decrease the likelihood of diabetic complications such as nephropathy, neuropathy, limb loss, blindness, coronary artery disease, and death. We discussed the importance of intensive lifestyle modification including diet, exercise and weight loss as the first line treatment for diabetes. Travis Blanchard agrees to continue his diabetes medications and will follow up at the agreed upon time.  COPD (chronic obstructive pulmonary disease) Travis Blanchard agrees to use ventolin inhaler 1 puff every 6 hours as needed for wheezing or shortness of breath #1 inhaler with no refills and follow up as directed.  Obesity Travis Blanchard is currently in the action stage of change. As such, his goal is to continue with  weight loss efforts He has agreed to follow the Category 3 plan Travis Blanchard will continue trying to be more active for weight loss and overall health benefits. We discussed the following Behavioral Modification Strategies  today: planning for success, increase H2O intake, no skipping meals, keeping healthy foods in the home, better snacking choices, increasing lean protein intake, decreasing simple carbohydrates, increasing vegetables, decrease eating out, work on meal planning and easy cooking plans, emotional eating strategies, ways to avoid boredom eating and ways to avoid night time snacking Travis Blanchard will continue to weigh himself at home and record before each visit. Travis Blanchard will increase adherence to the plan.  Travis Blanchard has agreed to follow up with our clinic in 2 weeks. He was informed of the importance of frequent follow up visits to maximize his success with intensive lifestyle modifications for his multiple health conditions.  ALLERGIES: Allergies  Allergen Reactions  . Penicillins Hives, Itching and Other (See Comments)    Has patient had a PCN reaction causing immediate rash, facial/tongue/throat swelling, SOB or lightheadedness with hypotension: Yes Has patient had a PCN reaction causing severe rash involving mucus membranes or skin necrosis: No Has patient had a PCN reaction that required hospitalization: No Has patient had a PCN reaction occurring within the last 10 years: No If all of the above answers are "NO", then may proceed with Cephalosporin use.     MEDICATIONS: Current Outpatient Medications on File Prior to Visit  Medication Sig Dispense Refill  . acetaminophen (TYLENOL) 500 MG tablet Take 1,000 mg by mouth every 6 (six) hours as needed.    Travis Blanchard atorvastatin (LIPITOR) 20 MG tablet Take 20 mg by mouth daily.     . Cholecalciferol (VITAMIN D3) 1000 units CAPS Take by mouth.    . fluticasone (FLONASE) 50 MCG/ACT nasal spray Place into both nostrils daily.    Travis Blanchard ipratropium-albuterol (DUONEB) 0.5-2.5 (3) MG/3ML SOLN Take 3 mLs by nebulization.    Travis Blanchard lisinopril (PRINIVIL,ZESTRIL) 30 MG tablet Take 30 mg by mouth daily.     Travis Blanchard loratadine (CLARITIN) 10 MG tablet Take 10 mg by mouth daily.    . metFORMIN  (GLUCOPHAGE) 500 MG tablet Take 500 mg by mouth 2 (two) times daily with a meal.     . naproxen sodium (ALEVE) 220 MG tablet Take 220 mg by mouth at bedtime.    . psyllium (METAMUCIL) 58.6 % powder Take 1 packet by mouth at bedtime. SUGAR FREE ORANGE FLAVORED    . vitamin B-12 (CYANOCOBALAMIN) 1000 MCG tablet Take 1,000 mcg by mouth daily.    Travis Blanchard zolpidem (AMBIEN) 5 MG tablet Take 5 mg by mouth at bedtime.      No current facility-administered medications on file prior to visit.     PAST MEDICAL HISTORY: Past Medical History:  Diagnosis Date  . Arthritis   . Asthma   . B12 deficiency   . Cancer [facility])    Prostate  . COPD (chronic obstructive pulmonary disease) ([place])   . Cough   . Decreased hearing   . Diabetes mellitus without complication ([place])   . Floaters in visual field   . Frequent urination   . Hoarseness   . Hyperlipidemia   . Hypertension   . Joint pain   . Lower back pain   . Muscle pain   . Muscle stiffness   . Neck stiffness   . Right hip pain   . Right knee pain   . Shortness of breath    with sudden awakening from  sleep  . Tinnitus   . Trouble in sleeping   . Umbilical hernia   . Vitamin D deficiency   . Wheezing     PAST SURGICAL HISTORY: Past Surgical History:  Procedure Laterality Date  . COLONOSCOPY  2008   Dr. Gala Romney: 2 diminutive polyps in rectum (hyperplastic), otherwise normal  . COLONOSCOPY  2000   Per notes in epic, history of adenomas at outside facility  . COLONOSCOPY WITH PROPOFOL N/A 07/18/2017   Procedure: COLONOSCOPY WITH PROPOFOL;  Surgeon: Daneil Dolin, MD;  Location: [facility];  Service: Endoscopy;  Laterality: N/A;  8:45  . HEMORRHOID SURGERY    . KNEE ARTHROSCOPY     Left  . KNEE ARTHROSCOPY     right  . PROSTATE SURGERY  2000   Prostate removal/Cancer    SOCIAL HISTORY: Social History   Tobacco Use  . Smoking status: Former Smoker    Types: Cigarettes    Last attempt to quit: 03/11/2002    Years since quitting: 16.6   . Smokeless tobacco: Never Used  Substance Use Topics  . Alcohol use: No    Frequency: Never  . Drug use: No    FAMILY HISTORY: Family History  Problem Relation Age of Onset  . Diabetes Mother   . Hypertension Mother   . Hyperlipidemia Mother   . Stroke Mother   . Heart disease Mother   . Depression Mother   . Obesity Mother   . Eating disorder Mother   . Cancer Father   . Depression Father   . Anxiety disorder Father   . Liver disease Father   . Alcoholism Father   . Cancer Sister        Breast  . Colon cancer Neg Hx     ROS: Review of Systems  Constitutional: Negative for weight loss.  Respiratory: Positive for shortness of breath (with exertion).   Cardiovascular: Negative for chest pain.  Endo/Heme/Allergies:       Negative for hypoglycemia    PHYSICAL EXAM: Pt in no acute distress  RECENT LABS AND TESTS: BMET    Component Value Date/Time   NA 139 07/15/2018 2200   NA 144 03/25/2018 1041   K 4.0 07/15/2018 2200   CL 106 07/15/2018 2200   CO2 25 07/15/2018 2200   GLUCOSE 166 (H) 07/15/2018 2200   BUN 11 07/15/2018 2200   BUN 12 03/25/2018 1041   CREATININE 0.89 07/15/2018 2200   CALCIUM 9.3 07/15/2018 2200   GFRNONAA >60 07/15/2018 2200   GFRAA >60 07/15/2018 2200   Lab Results  Component Value Date   HGBA1C 6.6 (H) 03/25/2018   No results found for: INSULIN CBC    Component Value Date/Time   WBC 10.1 07/15/2018 2200   RBC 5.09 07/15/2018 2200   HGB 14.5 07/15/2018 2200   HCT 45.9 07/15/2018 2200   PLT 209 07/15/2018 2200   MCV 90.2 07/15/2018 2200   MCH 28.5 07/15/2018 2200   MCHC 31.6 07/15/2018 2200   RDW 12.3 07/15/2018 2200   LYMPHSABS 2.8 07/12/2017 0850   MONOABS 0.3 07/12/2017 0850   EOSABS 0.1 07/12/2017 0850   BASOSABS 0.0 07/12/2017 0850   Iron/TIBC/Ferritin/ %Sat No results found for: IRON, TIBC, FERRITIN, IRONPCTSAT Lipid Panel     Component Value Date/Time   CHOL 170 03/25/2018 1041   TRIG 88 03/25/2018 1041    HDL 52 03/25/2018 1041   CHOLHDL 3.3 03/25/2018 1041   LDLCALC 100 (H) 03/25/2018 1041   Hepatic  Function Panel     Component Value Date/Time   PROT 6.9 03/25/2018 1041   ALBUMIN 4.5 03/25/2018 1041   AST 15 03/25/2018 1041   ALT 16 03/25/2018 1041   ALKPHOS 92 03/25/2018 1041   BILITOT 0.3 03/25/2018 1041   No results found for: TSH   Ref. Range 03/25/2018 10:41  Vitamin D, 25-Hydroxy Latest Ref Range: 30.0 - 100.0 ng/mL 42.6    I, Doreene Nest, am acting as Location manager for [employer]. Owens Shark, DO  I have reviewed the above documentation for accuracy and completeness, and I agree with the above. -Jearld Lesch, DO

## 2018-11-24 ENCOUNTER — Other Ambulatory Visit: Payer: Self-pay

## 2018-11-24 ENCOUNTER — Ambulatory Visit (INDEPENDENT_AMBULATORY_CARE_PROVIDER_SITE_OTHER): Payer: Medicare Other | Admitting: Bariatrics

## 2018-11-24 ENCOUNTER — Encounter (INDEPENDENT_AMBULATORY_CARE_PROVIDER_SITE_OTHER): Payer: Self-pay | Admitting: Bariatrics

## 2018-11-24 DIAGNOSIS — E119 Type 2 diabetes mellitus without complications: Secondary | ICD-10-CM

## 2018-11-24 DIAGNOSIS — Z6841 Body Mass Index (BMI) 40.0 and over, adult: Secondary | ICD-10-CM

## 2018-11-24 DIAGNOSIS — I10 Essential (primary) hypertension: Secondary | ICD-10-CM | POA: Diagnosis not present

## 2018-11-24 DIAGNOSIS — E66813 Obesity, class 3: Secondary | ICD-10-CM

## 2018-11-25 NOTE — Progress Notes (Addendum)
Office: [phone]  /  Fax: [phone] TeleHealth Visit:  Travis Blanchard has verbally consented to this TeleHealth visit today. The patient is located in his car, the provider is located at the [employer] [facility] office. The participants in this visit include the listed provider and patient and any and all parties involved. The visit was conducted today via telephone. Travis Blanchard was unable to use realtime audiovisual technology today and the telehealth visit was conducted via telephone ( time 15 minutes ).  HPI:   Chief Complaint: OBESITY Travis Blanchard is here to discuss his progress with his obesity treatment plan. He is on the Category 3 plan and is following his eating plan approximately 20 % of the time. He states he is exercising 0 minutes 0 times per week. Travis Blanchard continues to have knee pain. He is not weighing himself at home and he is unsure if he has gained weight. He thinks that he has gained weight, but he is unsure how much. We were unable to weigh the patient today for this TeleHealth visit. He feels as if he has gained weight since his last visit.   Diabetes II Travis Blanchard has a diagnosis of diabetes type II. Travis Blanchard states fasting BGs range between 120 and 130's and he denies any hypoglycemic episodes. Last A1c was at 6.6 He has been working on intensive lifestyle modifications including diet, exercise, and weight loss to help control his blood glucose levels.  Hypertension Travis Blanchard is a 73 y.o. male with hypertension. Travis Blanchard denies chest pain or shortness of breath on exertion. He is working weight loss to help control his blood Blanchard with the goal of decreasing his risk of heart attack and stroke. Travis Blanchard is well controlled.  ASSESSMENT AND PLAN:  Type 2 diabetes mellitus without complication, without long-term current use of insulin (HCC)  Essential hypertension  Class 3 severe obesity with serious comorbidity and body mass index (BMI) of 40.0 to 44.9 in  adult, unspecified obesity type ([place])  PLAN:  Diabetes II Travis Blanchard has been given extensive diabetes education by myself today including ideal fasting and post-prandial blood glucose readings, individual ideal Hgb A1c goals and hypoglycemia prevention. We discussed the importance of good blood sugar control to decrease the likelihood of diabetic complications such as nephropathy, neuropathy, limb loss, blindness, coronary artery disease, and death. We discussed the importance of intensive lifestyle modification including diet, exercise and weight loss as the first line treatment for diabetes. Travis Blanchard continues to move ahead with weight loss (bariatric) surgery in the future and he is seeing the [facility]. [name] will continue his diabetes medications and will follow up at the agreed upon time.  Hypertension We discussed sodium restriction, working on healthy weight loss, and a regular exercise program as the means to achieve improved blood Blanchard control. Travis Blanchard agreed with this plan and agreed to follow up as directed. We will continue to monitor his blood Blanchard as well as his progress with the above lifestyle modifications. He will continue his medications as prescribed and will watch for signs of hypotension as he continues his lifestyle modifications.   Obesity Travis Blanchard is currently in the action stage of change. As such, his goal is to continue with weight loss efforts He has agreed to follow the Category 3 plan Travis Blanchard has been instructed to work up to a goal of 150 minutes of combined cardio and strengthening exercise per week for weight loss and overall health benefits. We discussed the following  Behavioral Modification Strategies today: increase H2O intake, no skipping meals, keeping healthy foods in the home, increasing lean protein intake, decreasing simple carbohydrates, increasing vegetables, decrease eating out and work on meal planning and easy cooking plans Travis Blanchard will get back on  his plan. He has a scheduled appointment with the orthopedist.  Travis Blanchard has agreed to follow up with our clinic in 2 weeks. He was informed of the importance of frequent follow up visits to maximize his success with intensive lifestyle modifications for his multiple health conditions.  ALLERGIES: Allergies  Allergen Reactions  . Penicillins Hives, Itching and Other (See Comments)    Has patient had a PCN reaction causing immediate rash, facial/tongue/throat swelling, SOB or lightheadedness with hypotension: Yes Has patient had a PCN reaction causing severe rash involving mucus membranes or skin necrosis: No Has patient had a PCN reaction that required hospitalization: No Has patient had a PCN reaction occurring within the last 10 years: No If all of the above answers are "NO", then may proceed with Cephalosporin use.     MEDICATIONS: Current Outpatient Medications on File Prior to Visit  Medication Sig Dispense Refill  . acetaminophen (TYLENOL) 500 MG tablet Take 1,000 mg by mouth every 6 (six) hours as needed.    [name] albuterol (VENTOLIN HFA) 108 (90 Base) MCG/ACT inhaler Inhale 1 puff into the lungs every 6 (six) hours as needed for wheezing or shortness of breath. 1 Inhaler 0  . atorvastatin (LIPITOR) 20 MG tablet Take 20 mg by mouth daily.     . Cholecalciferol (VITAMIN D3) 1000 units CAPS Take by mouth.    . fluticasone (FLONASE) 50 MCG/ACT nasal spray Place into both nostrils daily.    [name] ipratropium-albuterol (DUONEB) 0.5-2.5 (3) MG/3ML SOLN Take 3 mLs by nebulization.    [name] lisinopril (PRINIVIL,ZESTRIL) 30 MG tablet Take 30 mg by mouth daily.     [name] loratadine (CLARITIN) 10 MG tablet Take 10 mg by mouth daily.    . metFORMIN (GLUCOPHAGE) 500 MG tablet Take 500 mg by mouth 2 (two) times daily with a meal.     . naproxen sodium (ALEVE) 220 MG tablet Take 220 mg by mouth at bedtime.    . psyllium (METAMUCIL) 58.6 % powder Take 1 packet by mouth at bedtime. SUGAR FREE ORANGE FLAVORED    .  vitamin B-12 (CYANOCOBALAMIN) 1000 MCG tablet Take 1,000 mcg by mouth daily.    [name] zolpidem (AMBIEN) 5 MG tablet Take 5 mg by mouth at bedtime.      No current facility-administered medications on file prior to visit.     PAST MEDICAL HISTORY: Past Medical History:  Diagnosis Date  . Arthritis   . Asthma   . B12 deficiency   . Cancer [facility])    Prostate  . COPD (chronic obstructive pulmonary disease) ([place])   . Cough   . Decreased hearing   . Diabetes mellitus without complication ([place])   . Floaters in visual field   . Frequent urination   . Hoarseness   . Hyperlipidemia   . Hypertension   . Joint pain   . Lower back pain   . Muscle pain   . Muscle stiffness   . Neck stiffness   . Right hip pain   . Right knee pain   . Shortness of breath    with sudden awakening from sleep  . Tinnitus   . Trouble in sleeping   . Umbilical hernia   . Vitamin D deficiency   . Wheezing  PAST SURGICAL HISTORY: Past Surgical History:  Procedure Laterality Date  . COLONOSCOPY  2008   Dr. Gala Romney: 2 diminutive polyps in rectum (hyperplastic), otherwise normal  . COLONOSCOPY  2000   Per notes in epic, history of adenomas at outside facility  . COLONOSCOPY WITH PROPOFOL N/A 07/18/2017   Procedure: COLONOSCOPY WITH PROPOFOL;  Surgeon: Daneil Dolin, MD;  [facility];  Service: Endoscopy;  Laterality: N/A;  8:45  . HEMORRHOID SURGERY    . KNEE ARTHROSCOPY     Left  . KNEE ARTHROSCOPY     right  . PROSTATE SURGERY  2000   Prostate removal/Cancer    SOCIAL HISTORY: Social History   Tobacco Use  . Smoking status: Former Smoker    Types: Cigarettes    Last attempt to quit: 03/11/2002    Years since quitting: 16.7  . Smokeless tobacco: Never Used  Substance Use Topics  . Alcohol use: No    Frequency: Never  . Drug use: No    FAMILY HISTORY: Family History  Problem Relation Age of Onset  . Diabetes Mother   . Hypertension Mother   . Hyperlipidemia Mother   .  Stroke Mother   . Heart disease Mother   . Depression Mother   . Obesity Mother   . Eating disorder Mother   . Cancer Father   . Depression Father   . Anxiety disorder Father   . Liver disease Father   . Alcoholism Father   . Cancer Sister        Breast  . Colon cancer Neg Hx     ROS: Review of Systems  Constitutional: Negative for weight loss.  Respiratory: Negative for shortness of breath (on exertion).   Cardiovascular: Negative for chest pain.  Musculoskeletal:       Positive for knee pain   Endo/Heme/Allergies:       Negative for hypoglycemia    PHYSICAL EXAM: Pt in no acute distress  RECENT LABS AND TESTS: BMET    Component Value Date/Time   NA 139 07/15/2018 2200   NA 144 03/25/2018 1041   K 4.0 07/15/2018 2200   CL 106 07/15/2018 2200   CO2 25 07/15/2018 2200   GLUCOSE 166 (H) 07/15/2018 2200   BUN 11 07/15/2018 2200   BUN 12 03/25/2018 1041   CREATININE 0.89 07/15/2018 2200   CALCIUM 9.3 07/15/2018 2200   GFRNONAA >60 07/15/2018 2200   GFRAA >60 07/15/2018 2200   Lab Results  Component Value Date   HGBA1C 6.6 (H) 03/25/2018   No results found for: INSULIN CBC    Component Value Date/Time   WBC 10.1 07/15/2018 2200   RBC 5.09 07/15/2018 2200   HGB 14.5 07/15/2018 2200   HCT 45.9 07/15/2018 2200   PLT 209 07/15/2018 2200   MCV 90.2 07/15/2018 2200   MCH 28.5 07/15/2018 2200   MCHC 31.6 07/15/2018 2200   RDW 12.3 07/15/2018 2200   LYMPHSABS 2.8 07/12/2017 0850   MONOABS 0.3 07/12/2017 0850   EOSABS 0.1 07/12/2017 0850   BASOSABS 0.0 07/12/2017 0850   Iron/TIBC/Ferritin/ %Sat No results found for: IRON, TIBC, FERRITIN, IRONPCTSAT Lipid Panel     Component Value Date/Time   CHOL 170 03/25/2018 1041   TRIG 88 03/25/2018 1041   HDL 52 03/25/2018 1041   CHOLHDL 3.3 03/25/2018 1041   LDLCALC 100 (H) 03/25/2018 1041   Hepatic Function Panel     Component Value Date/Time   PROT 6.9 03/25/2018 1041  ALBUMIN 4.5 03/25/2018 1041   AST  15 03/25/2018 1041   ALT 16 03/25/2018 1041   ALKPHOS 92 03/25/2018 1041   BILITOT 0.3 03/25/2018 1041   No results found for: TSH    Ref. Range 03/25/2018 10:41  Vitamin D, 25-Hydroxy Latest Ref Range: 30.0 - 100.0 ng/mL 42.6   I, Doreene Nest, am acting as Location manager for [employer]. Owens Shark, DO  I have reviewed the above documentation for accuracy and completeness, and I agree with the above. -Jearld Lesch, DO

## 2018-12-03 ENCOUNTER — Ambulatory Visit: Payer: Medicare Other | Admitting: Dietician

## 2018-12-08 ENCOUNTER — Ambulatory Visit (INDEPENDENT_AMBULATORY_CARE_PROVIDER_SITE_OTHER): Payer: Medicare Other | Admitting: Bariatrics

## 2018-12-10 ENCOUNTER — Ambulatory Visit (INDEPENDENT_AMBULATORY_CARE_PROVIDER_SITE_OTHER): Payer: Medicare Other | Admitting: Psychology

## 2018-12-10 DIAGNOSIS — F509 Eating disorder, unspecified: Secondary | ICD-10-CM

## 2018-12-15 ENCOUNTER — Encounter (INDEPENDENT_AMBULATORY_CARE_PROVIDER_SITE_OTHER): Payer: Self-pay | Admitting: Bariatrics

## 2018-12-15 ENCOUNTER — Ambulatory Visit (INDEPENDENT_AMBULATORY_CARE_PROVIDER_SITE_OTHER): Payer: Medicare Other | Admitting: Bariatrics

## 2018-12-15 ENCOUNTER — Other Ambulatory Visit: Payer: Self-pay

## 2018-12-15 DIAGNOSIS — E119 Type 2 diabetes mellitus without complications: Secondary | ICD-10-CM | POA: Diagnosis not present

## 2018-12-15 DIAGNOSIS — I10 Essential (primary) hypertension: Secondary | ICD-10-CM

## 2018-12-15 DIAGNOSIS — Z6841 Body Mass Index (BMI) 40.0 and over, adult: Secondary | ICD-10-CM | POA: Diagnosis not present

## 2018-12-16 NOTE — Progress Notes (Signed)
Office: [phone]  /  Fax: [phone] TeleHealth Visit:  Travis Blanchard has verbally consented to this TeleHealth visit today. The patient is located at home, the provider is located at the [employer] [facility] office. The participants in this visit include the listed provider and patient and any and all parties involved. The visit was conducted today via telephone. Travis Blanchard was unable to use realtime audiovisual technology today and the telehealth visit was conducted via telephone.  HPI:   Chief Complaint: OBESITY Travis Blanchard is here to discuss his progress with his obesity treatment plan. He is on the Category 3 plan and is following his eating plan approximately 65 to 70 % of the time. He states he is doing chores 30 minutes 5 times per week. Travis Blanchard states that his weight remains the same (weight 300 lbs). He continues to plan to have bariatric surgery. Travis Blanchard will see his orthopedist this week. We were unable to weigh the patient today for this TeleHealth visit. He feels as if he has maintained weight since his last visit. He has gained 6 lbs since starting treatment with [name].  Hypertension Travis Blanchard is a 73 y.o. male with hypertension. Travis Blanchard denies lightheadedness. He is working weight loss to help control his blood pressure with the goal of decreasing his risk of heart attack and stroke. Travis Blanchard blood pressure is well controlled at 109/60.  Diabetes II Travis Blanchard has a diagnosis of diabetes type II. He is taking metformin currently. Travis Blanchard states fasting BGs range between 120 and 130's and he denies any hypoglycemic episodes. Last A1c was at 6.6 He has been working on intensive lifestyle modifications including diet, exercise, and weight loss to help control his blood glucose levels.  ASSESSMENT AND PLAN:  Essential hypertension  Type 2 diabetes mellitus without complication, without long-term current use of insulin (HCC)  Class 3 severe obesity with serious comorbidity and body mass  index (BMI) of 40.0 to 44.9 in adult, unspecified obesity type ([place])  PLAN:  Hypertension We discussed sodium restriction, working on healthy weight loss, and a regular exercise program as the means to achieve improved blood pressure control. Travis Blanchard agreed with this plan and agreed to follow up as directed. We will continue to monitor his blood pressure as well as his progress with the above lifestyle modifications. He will continue his medications as prescribed and will watch for signs of hypotension as he continues his lifestyle modifications.  Diabetes II Travis Blanchard has been given extensive diabetes education by myself today including ideal fasting and post-prandial blood glucose readings, individual ideal Hgb A1c goals and hypoglycemia prevention. We discussed the importance of good blood sugar control to decrease the likelihood of diabetic complications such as nephropathy, neuropathy, limb loss, blindness, coronary artery disease, and death. We discussed the importance of intensive lifestyle modification including diet, exercise and weight loss as the first line treatment for diabetes. Travis Blanchard agrees to continue his diabetes medications and will follow up at the agreed upon time.  Obesity Travis Blanchard is currently in the action stage of change. As such, his goal is to continue with weight loss efforts He has agreed to follow the Category 3 plan Travis Blanchard will continue to remain active for weight loss and overall health benefits. We discussed the following Behavioral Modification Strategies today: increase H2O intake, no skipping meals, keeping healthy foods in the home, better snacking choices, increasing lean protein intake, decreasing simple carbohydrates, increasing vegetables, decrease eating out, work on meal planning and easy cooking plans  and emotional eating strategies Travis Blanchard will weigh himself at home and record. He will follow up with bariatric surgery and surgery possibly in August or September.  Travis Blanchard has  agreed to follow up with our clinic in 2 weeks. He was informed of the importance of frequent follow up visits to maximize his success with intensive lifestyle modifications for his multiple health conditions.  ALLERGIES: Allergies  Allergen Reactions  . Penicillins Hives, Itching and Other (See Comments)    Has patient had a PCN reaction causing immediate rash, facial/tongue/throat swelling, SOB or lightheadedness with hypotension: Yes Has patient had a PCN reaction causing severe rash involving mucus membranes or skin necrosis: No Has patient had a PCN reaction that required hospitalization: No Has patient had a PCN reaction occurring within the last 10 years: No If all of the above answers are "NO", then may proceed with Cephalosporin use.     MEDICATIONS: Current Outpatient Medications on File Prior to Visit  Medication Sig Dispense Refill  . acetaminophen (TYLENOL) 500 MG tablet Take 1,000 mg by mouth every 6 (six) hours as needed.    Travis Blanchard Kitchen albuterol (VENTOLIN HFA) 108 (90 Base) MCG/ACT inhaler Inhale 1 puff into the lungs every 6 (six) hours as needed for wheezing or shortness of breath. 1 Inhaler 0  . atorvastatin (LIPITOR) 20 MG tablet Take 20 mg by mouth daily.     . Cholecalciferol (VITAMIN D3) 1000 units CAPS Take by mouth.    . fluticasone (FLONASE) 50 MCG/ACT nasal spray Place into both nostrils daily.    Travis Blanchard Kitchen ipratropium-albuterol (DUONEB) 0.5-2.5 (3) MG/3ML SOLN Take 3 mLs by nebulization.    Travis Blanchard Kitchen lisinopril (PRINIVIL,ZESTRIL) 30 MG tablet Take 30 mg by mouth daily.     Travis Blanchard Kitchen loratadine (CLARITIN) 10 MG tablet Take 10 mg by mouth daily.    . metFORMIN (GLUCOPHAGE) 500 MG tablet Take 500 mg by mouth 2 (two) times daily with a meal.     . naproxen sodium (ALEVE) 220 MG tablet Take 220 mg by mouth at bedtime.    . psyllium (METAMUCIL) 58.6 % powder Take 1 packet by mouth at bedtime. SUGAR FREE ORANGE FLAVORED    . vitamin B-12 (CYANOCOBALAMIN) 1000 MCG tablet Take 1,000 mcg by mouth daily.     Travis Blanchard Kitchen zolpidem (AMBIEN) 5 MG tablet Take 5 mg by mouth at bedtime.      No current facility-administered medications on file prior to visit.     PAST MEDICAL HISTORY: Past Medical History:  Diagnosis Date  . Arthritis   . Asthma   . B12 deficiency   . Cancer [facility])    Prostate  . COPD (chronic obstructive pulmonary disease) ([place])   . Cough   . Decreased hearing   . Diabetes mellitus without complication ([place])   . Floaters in visual field   . Frequent urination   . Hoarseness   . Hyperlipidemia   . Hypertension   . Joint pain   . Lower back pain   . Muscle pain   . Muscle stiffness   . Neck stiffness   . Right hip pain   . Right knee pain   . Shortness of breath    with sudden awakening from sleep  . Tinnitus   . Trouble in sleeping   . Umbilical hernia   . Vitamin D deficiency   . Wheezing     PAST SURGICAL HISTORY: Past Surgical History:  Procedure Laterality Date  . COLONOSCOPY  2008   Dr. Gala Romney: 2 diminutive polyps  in rectum (hyperplastic), otherwise normal  . COLONOSCOPY  2000   Per notes in epic, history of adenomas at outside facility  . COLONOSCOPY WITH PROPOFOL N/A 07/18/2017   Procedure: COLONOSCOPY WITH PROPOFOL;  Surgeon: Daneil Dolin, MD;  [facility];  Service: Endoscopy;  Laterality: N/A;  8:45  . HEMORRHOID SURGERY    . KNEE ARTHROSCOPY     Left  . KNEE ARTHROSCOPY     right  . PROSTATE SURGERY  2000   Prostate removal/Cancer    SOCIAL HISTORY: Social History   Tobacco Use  . Smoking status: Former Smoker    Types: Cigarettes    Last attempt to quit: 03/11/2002    Years since quitting: 16.7  . Smokeless tobacco: Never Used  Substance Use Topics  . Alcohol use: No    Frequency: Never  . Drug use: No    FAMILY HISTORY: Family History  Problem Relation Age of Onset  . Diabetes Mother   . Hypertension Mother   . Hyperlipidemia Mother   . Stroke Mother   . Heart disease Mother   . Depression Mother   . Obesity Mother    . Eating disorder Mother   . Cancer Father   . Depression Father   . Anxiety disorder Father   . Liver disease Father   . Alcoholism Father   . Cancer Sister        Breast  . Colon cancer Neg Hx     ROS: Review of Systems  Constitutional: Negative for weight loss.  Neurological:       Negative for lightheadedness  Endo/Heme/Allergies:       Negative for hypoglycemia    PHYSICAL EXAM: Pt in no acute distress  RECENT LABS AND TESTS: BMET    Component Value Date/Time   NA 139 07/15/2018 2200   NA 144 03/25/2018 1041   K 4.0 07/15/2018 2200   CL 106 07/15/2018 2200   CO2 25 07/15/2018 2200   GLUCOSE 166 (H) 07/15/2018 2200   BUN 11 07/15/2018 2200   BUN 12 03/25/2018 1041   CREATININE 0.89 07/15/2018 2200   CALCIUM 9.3 07/15/2018 2200   GFRNONAA >60 07/15/2018 2200   GFRAA >60 07/15/2018 2200   Lab Results  Component Value Date   HGBA1C 6.6 (H) 03/25/2018   No results found for: INSULIN CBC    Component Value Date/Time   WBC 10.1 07/15/2018 2200   RBC 5.09 07/15/2018 2200   HGB 14.5 07/15/2018 2200   HCT 45.9 07/15/2018 2200   PLT 209 07/15/2018 2200   MCV 90.2 07/15/2018 2200   MCH 28.5 07/15/2018 2200   MCHC 31.6 07/15/2018 2200   RDW 12.3 07/15/2018 2200   LYMPHSABS 2.8 07/12/2017 0850   MONOABS 0.3 07/12/2017 0850   EOSABS 0.1 07/12/2017 0850   BASOSABS 0.0 07/12/2017 0850   Iron/TIBC/Ferritin/ %Sat No results found for: IRON, TIBC, FERRITIN, IRONPCTSAT Lipid Panel     Component Value Date/Time   CHOL 170 03/25/2018 1041   TRIG 88 03/25/2018 1041   HDL 52 03/25/2018 1041   CHOLHDL 3.3 03/25/2018 1041   LDLCALC 100 (H) 03/25/2018 1041   Hepatic Function Panel     Component Value Date/Time   PROT 6.9 03/25/2018 1041   ALBUMIN 4.5 03/25/2018 1041   AST 15 03/25/2018 1041   ALT 16 03/25/2018 1041   ALKPHOS 92 03/25/2018 1041   BILITOT 0.3 03/25/2018 1041   No results found for: TSH    Ref. Range  03/25/2018 10:41  Vitamin D,  25-Hydroxy Latest Ref Range: 30.0 - 100.0 ng/mL 42.6    I, Doreene Nest, am acting as Location manager for [employer]. Owens Shark, DO  I have reviewed the above documentation for accuracy and completeness, and I agree with the above. -Jearld Lesch, DO

## 2018-12-18 ENCOUNTER — Ambulatory Visit (HOSPITAL_COMMUNITY)
Admission: RE | Admit: 2018-12-18 | Discharge: 2018-12-18 | Disposition: A | Discharge status: Home / Self Care | Payer: Medicare Other | Source: Ambulatory Visit | Attending: General Surgery | Admitting: General Surgery

## 2018-12-18 ENCOUNTER — Other Ambulatory Visit: Payer: Self-pay

## 2018-12-18 DIAGNOSIS — Z01818 Encounter for other preprocedural examination: Secondary | ICD-10-CM | POA: Diagnosis not present

## 2018-12-24 ENCOUNTER — Ambulatory Visit (INDEPENDENT_AMBULATORY_CARE_PROVIDER_SITE_OTHER): Payer: Medicare Other | Admitting: Psychology

## 2018-12-24 DIAGNOSIS — M25551 Pain in right hip: Secondary | ICD-10-CM | POA: Diagnosis not present

## 2018-12-24 DIAGNOSIS — F509 Eating disorder, unspecified: Secondary | ICD-10-CM | POA: Diagnosis not present

## 2018-12-24 DIAGNOSIS — M25561 Pain in right knee: Secondary | ICD-10-CM | POA: Diagnosis not present

## 2018-12-30 ENCOUNTER — Encounter (INDEPENDENT_AMBULATORY_CARE_PROVIDER_SITE_OTHER): Payer: Self-pay | Admitting: Bariatrics

## 2018-12-30 ENCOUNTER — Other Ambulatory Visit: Payer: Self-pay

## 2018-12-30 ENCOUNTER — Telehealth (INDEPENDENT_AMBULATORY_CARE_PROVIDER_SITE_OTHER): Payer: Medicare Other | Admitting: Bariatrics

## 2018-12-30 DIAGNOSIS — E119 Type 2 diabetes mellitus without complications: Secondary | ICD-10-CM | POA: Diagnosis not present

## 2018-12-30 DIAGNOSIS — Z6841 Body Mass Index (BMI) 40.0 and over, adult: Secondary | ICD-10-CM

## 2018-12-30 DIAGNOSIS — I10 Essential (primary) hypertension: Secondary | ICD-10-CM

## 2018-12-31 ENCOUNTER — Ambulatory Visit (INDEPENDENT_AMBULATORY_CARE_PROVIDER_SITE_OTHER): Payer: Medicare Other | Admitting: Bariatrics

## 2018-12-31 NOTE — Progress Notes (Signed)
Office: [phone]  /  Fax: [phone] TeleHealth Visit:  EHTAN DELFAVERO has verbally consented to this TeleHealth visit today. The patient is located at home, the provider is located at the [employer] [facility] office. The participants in this visit include the listed provider, patient, and the patient's wife, [name]. The visit was conducted today via telephone call (15 minutes).  HPI:   Chief Complaint: OBESITY Kamel is here to discuss his progress with his obesity treatment plan. He is on the Category 3 plan and is following his eating plan approximately 65-70% of the time. He states he is exercising 0 minutes 0 times per week. Cleotha states that his weight has stayed the same. He is planning on bariatric surgery at [facility]. We were unable to weigh the patient today for this TeleHealth visit. He feels as if he has maintained his weight since his last visit. He has lost 0 lbs since starting treatment with [name].  Diabetes II Manolo has a diagnosis of diabetes type II and is taking Glucophage. Antwian states his fasting blood sugars are in the 140's and no higher than 160 in the afternoon. Last A1c was 6.6 on 03/25/2018. He has been working on intensive lifestyle modifications including diet, exercise, and weight loss to help control his blood glucose levels.  Hypertension ABRIEL GEESEY is a 73 y.o. male with hypertension, well controlled on lisinopril.  [name] denies chest pain or shortness of breath on exertion. He is working weight loss to help control his blood pressure with the goal of decreasing his risk of heart attack and stroke. He denies lightheadedness.  ASSESSMENT AND PLAN:  Type 2 diabetes mellitus without complication, without long-term current use of insulin (HCC)  Essential hypertension  Class 3 severe obesity with serious comorbidity and body mass index (BMI) of 40.0 to 44.9 in adult, unspecified obesity type ([place])  PLAN:  Diabetes II Tallis  has been given extensive diabetes education by myself today including ideal fasting and post-prandial blood glucose readings, individual ideal Hgb A1c goals  and hypoglycemia prevention. We discussed the importance of good blood sugar control to decrease the likelihood of diabetic complications such as nephropathy, neuropathy, limb loss, blindness, coronary artery disease, and death. We discussed the importance of intensive lifestyle modification including diet, exercise and weight loss as the first line treatment for diabetes. Roylee agrees to continue his diabetes medications and will follow-up at the agreed upon time.  Hypertension We discussed sodium restriction, working on healthy weight loss, and a regular exercise program as the means to achieve improved blood pressure control. Raidon agreed with this plan and agreed to follow up as directed. We will continue to monitor his blood pressure as well as his progress with the above lifestyle modifications. He will continue his medications as prescribed and will watch for signs of hypotension as he continues his lifestyle modifications.  Obesity Joevanni is currently in the action stage of change. As such, his goal is to continue with weight loss efforts. He has agreed to follow the Category 3 plan. Izmael will weigh himself at home until he returns to th office and will work on meal planning. Brixon has been instructed to work up to a goal of 150 minutes of combined cardio and strengthening exercise per week for weight loss and overall health benefits. We discussed the following Behavioral Modification Strategies today: increasing lean protein intake, decreasing simple carbohydrates, increasing vegetables, increase H20 intake, decrease eating out, no skipping meals,  work on meal planning and easy cooking plans, and keeping healthy foods in the home.  Kesley has agreed to follow-up with our clinic in 2 weeks. He was informed of the importance of frequent follow-up  visits to maximize his success with intensive lifestyle modifications for his multiple health conditions.  ALLERGIES: Allergies  Allergen Reactions  . Penicillins Hives, Itching and Other (See Comments)    Has patient had a PCN reaction causing immediate rash, facial/tongue/throat swelling, SOB or lightheadedness with hypotension: Yes Has patient had a PCN reaction causing severe rash involving mucus membranes or skin necrosis: No Has patient had a PCN reaction that required hospitalization: No Has patient had a PCN reaction occurring within the last 10 years: No If all of the above answers are "NO", then may proceed with Cephalosporin use.     MEDICATIONS: Current Outpatient Medications on File Prior to Visit  Medication Sig Dispense Refill  . acetaminophen (TYLENOL) 500 MG tablet Take 1,000 mg by mouth every 6 (six) hours as needed.    [name] albuterol (VENTOLIN HFA) 108 (90 Base) MCG/ACT inhaler Inhale 1 puff into the lungs every 6 (six) hours as needed for wheezing or shortness of breath. 1 Inhaler 0  . atorvastatin (LIPITOR) 20 MG tablet Take 20 mg by mouth daily.     . Cholecalciferol (VITAMIN D3) 1000 units CAPS Take by mouth.    . fluticasone (FLONASE) 50 MCG/ACT nasal spray Place into both nostrils daily.    [name] ipratropium-albuterol (DUONEB) 0.5-2.5 (3) MG/3ML SOLN Take 3 mLs by nebulization.    [name] lisinopril (PRINIVIL,ZESTRIL) 30 MG tablet Take 30 mg by mouth daily.     [name] loratadine (CLARITIN) 10 MG tablet Take 10 mg by mouth daily.    . metFORMIN (GLUCOPHAGE) 500 MG tablet Take 500 mg by mouth 2 (two) times daily with a meal.     . naproxen sodium (ALEVE) 220 MG tablet Take 220 mg by mouth at bedtime.    . psyllium (METAMUCIL) 58.6 % powder Take 1 packet by mouth at bedtime. SUGAR FREE ORANGE FLAVORED    . vitamin B-12 (CYANOCOBALAMIN) 1000 MCG tablet Take 1,000 mcg by mouth daily.    [name] zolpidem (AMBIEN) 5 MG tablet Take 5 mg by mouth at bedtime.      No current  facility-administered medications on file prior to visit.     PAST MEDICAL HISTORY: Past Medical History:  Diagnosis Date  . Arthritis   . Asthma   . B12 deficiency   . Cancer [facility])    Prostate  . COPD (chronic obstructive pulmonary disease) ([place])   . Cough   . Decreased hearing   . Diabetes mellitus without complication ([place])   . Floaters in visual field   . Frequent urination   . Hoarseness   . Hyperlipidemia   . Hypertension   . Joint pain   . Lower back pain   . Muscle pain   . Muscle stiffness   . Neck stiffness   . Right hip pain   . Right knee pain   . Shortness of breath    with sudden awakening from sleep  . Tinnitus   . Trouble in sleeping   . Umbilical hernia   . Vitamin D deficiency   . Wheezing     PAST SURGICAL HISTORY: Past Surgical History:  Procedure Laterality Date  . COLONOSCOPY  2008   Dr. Gala Romney: 2 diminutive polyps in rectum (hyperplastic), otherwise normal  . COLONOSCOPY  2000   Per  notes in epic, history of adenomas at outside facility  . COLONOSCOPY WITH PROPOFOL N/A 07/18/2017   Procedure: COLONOSCOPY WITH PROPOFOL;  Surgeon: Daneil Dolin, MD;  [facility];  Service: Endoscopy;  Laterality: N/A;  8:45  . HEMORRHOID SURGERY    . KNEE ARTHROSCOPY     Left  . KNEE ARTHROSCOPY     right  . PROSTATE SURGERY  2000   Prostate removal/Cancer    SOCIAL HISTORY: Social History   Tobacco Use  . Smoking status: Former Smoker    Types: Cigarettes    Quit date: 03/11/2002    Years since quitting: 16.8  . Smokeless tobacco: Never Used  Substance Use Topics  . Alcohol use: No    Frequency: Never  . Drug use: No    FAMILY HISTORY: Family History  Problem Relation Age of Onset  . Diabetes Mother   . Hypertension Mother   . Hyperlipidemia Mother   . Stroke Mother   . Heart disease Mother   . Depression Mother   . Obesity Mother   . Eating disorder Mother   . Cancer Father   . Depression Father   . Anxiety disorder  Father   . Liver disease Father   . Alcoholism Father   . Cancer Sister        Breast  . Colon cancer Neg Hx    ROS: Review of Systems  Respiratory: Negative for shortness of breath.   Cardiovascular: Negative for chest pain.   PHYSICAL EXAM: Pt in no acute distress  RECENT LABS AND TESTS: BMET    Component Value Date/Time   NA 139 07/15/2018 2200   NA 144 03/25/2018 1041   K 4.0 07/15/2018 2200   CL 106 07/15/2018 2200   CO2 25 07/15/2018 2200   GLUCOSE 166 (H) 07/15/2018 2200   BUN 11 07/15/2018 2200   BUN 12 03/25/2018 1041   CREATININE 0.89 07/15/2018 2200   CALCIUM 9.3 07/15/2018 2200   GFRNONAA >60 07/15/2018 2200   GFRAA >60 07/15/2018 2200   Lab Results  Component Value Date   HGBA1C 6.6 (H) 03/25/2018   No results found for: INSULIN CBC    Component Value Date/Time   WBC 10.1 07/15/2018 2200   RBC 5.09 07/15/2018 2200   HGB 14.5 07/15/2018 2200   HCT 45.9 07/15/2018 2200   PLT 209 07/15/2018 2200   MCV 90.2 07/15/2018 2200   MCH 28.5 07/15/2018 2200   MCHC 31.6 07/15/2018 2200   RDW 12.3 07/15/2018 2200   LYMPHSABS 2.8 07/12/2017 0850   MONOABS 0.3 07/12/2017 0850   EOSABS 0.1 07/12/2017 0850   BASOSABS 0.0 07/12/2017 0850   Iron/TIBC/Ferritin/ %Sat No results found for: IRON, TIBC, FERRITIN, IRONPCTSAT Lipid Panel     Component Value Date/Time   CHOL 170 03/25/2018 1041   TRIG 88 03/25/2018 1041   HDL 52 03/25/2018 1041   CHOLHDL 3.3 03/25/2018 1041   LDLCALC 100 (H) 03/25/2018 1041   Hepatic Function Panel     Component Value Date/Time   PROT 6.9 03/25/2018 1041   ALBUMIN 4.5 03/25/2018 1041   AST 15 03/25/2018 1041   ALT 16 03/25/2018 1041   ALKPHOS 92 03/25/2018 1041   BILITOT 0.3 03/25/2018 1041   No results found for: TSH   Results for SHERILL, WEGENER [address]" (MRN [id number]) as of 12/31/2018 07:29  Ref. Range 03/25/2018 10:41  Vitamin D, 25-Hydroxy Latest Ref Range: 30.0 - 100.0 ng/mL 42.6    I,  Michaelene Song, am  acting as Location manager for [employer], DO  I have reviewed the above documentation for accuracy and completeness, and I agree with the above. -Jearld Lesch, DO

## 2019-01-01 ENCOUNTER — Other Ambulatory Visit: Payer: Self-pay

## 2019-01-01 ENCOUNTER — Encounter: Payer: Medicare Other | Attending: General Surgery | Admitting: Dietician

## 2019-01-01 DIAGNOSIS — I10 Essential (primary) hypertension: Secondary | ICD-10-CM | POA: Insufficient documentation

## 2019-01-01 DIAGNOSIS — E119 Type 2 diabetes mellitus without complications: Secondary | ICD-10-CM | POA: Insufficient documentation

## 2019-01-01 DIAGNOSIS — R5383 Other fatigue: Secondary | ICD-10-CM | POA: Insufficient documentation

## 2019-01-01 DIAGNOSIS — E669 Obesity, unspecified: Secondary | ICD-10-CM

## 2019-01-05 ENCOUNTER — Encounter: Payer: Self-pay | Admitting: Dietician

## 2019-01-05 NOTE — Progress Notes (Signed)
Bariatric Supervised Weight Loss Visit Appt Start Time: 11:00am   End Time: 11:15am  Telephone Visit This visit was completed via telephone due to the COVID-19 pandemic.   I spoke with Travis Blanchard and verified that I was speaking with the correct person with two patient identifiers (full name and date of birth).   I discussed the limitations related to this kind of visit and the patient is willing to proceed.   Planned Surgery: Sleeve Gastrectomy   Pt is completing/has completed his SWL Appointments with [facility]. Pt is to report to [facility] for Pre-Op Class once SWL are complete.   Pt's wife, Travis Blanchard, is also planning to have the sleeve gastrectomy surgery and they participated in today's phone visit together. Pt is a former smoker. 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 He states he would like to lose weight to especially help with other ailments such as joint pain. Pt is personable and is excited for surgery.   NUTRITION ASSESSMENT  Start weight at [facility]: 307 lbs (date: 11/05/2018) BMI: 43.4 kg/m2    Medical Hx: obesity, anxiety, arthritis, lung disease, diabetes, gastric ulcer, hemorrhoids, HTN, hypercholesterolemia, prostate cancer  Estimated Energy Needs Calories: 2000 Carbohydrate: 225g Protein: 150g Fat: 55g   NUTRITION DIAGNOSIS  Overweight/obesity ([place]-3.3) related to past poor dietary habits and physical inactivity as evidenced by patient w/ planned Sleeve Gastrectomy surgery following dietary guidelines for continued weight loss.   NUTRITION INTERVENTION  Nutrition counseling (C-1) and education (E-2) to facilitate bariatric surgery goals.  Pre-Op Goals Progress & New Goals  More conscious of food choices by watching grams of fat and sugar on food labels   Not drinking with meals/snacks   Avoids carbonated and sugar-sweetened beverages   Consumes coffee daily   NEW: Find preferred protein  shake brands/flavors   Handouts Provided Include   Pre-Op Goals   Bariatric Surgery Protein Shakes (emailed to [email or web address])  Learning Style & Readiness for Change Teaching method utilized: Visual & Auditory  Demonstrated degree of understanding LSL:HTDSK Back  Barriers to learning/adherence to lifestyle change: None Identified   MONITORING & EVALUATION Dietary intake, weekly physical activity, body weight, and pre-op goals.   Next Steps  Patient is to return to [facility] for Pre-Op Class once SWL visits are confirmed complete at [facility].

## 2019-01-13 ENCOUNTER — Other Ambulatory Visit: Payer: Self-pay

## 2019-01-13 ENCOUNTER — Telehealth (INDEPENDENT_AMBULATORY_CARE_PROVIDER_SITE_OTHER): Payer: Medicare Other | Admitting: Bariatrics

## 2019-01-13 DIAGNOSIS — I10 Essential (primary) hypertension: Secondary | ICD-10-CM | POA: Diagnosis not present

## 2019-01-13 DIAGNOSIS — Z6841 Body Mass Index (BMI) 40.0 and over, adult: Secondary | ICD-10-CM | POA: Diagnosis not present

## 2019-01-13 DIAGNOSIS — E1140 Type 2 diabetes mellitus with diabetic neuropathy, unspecified: Secondary | ICD-10-CM | POA: Diagnosis not present

## 2019-01-14 NOTE — Progress Notes (Signed)
Office: [phone]  /  Fax: [phone] TeleHealth Visit:  Travis Blanchard has verbally consented to this TeleHealth visit today. The patient is located at home, the provider is located at the [employer] [facility] office. The participants in this visit include the listed provider and patient and any and all parties involved. The visit was conducted today via telephone (15 minutes ) Bensen was unable to use realtime audiovisual technology today and the telehealth visit was conducted via telephone.  HPI:   Chief Complaint: OBESITY Travis Blanchard is here to discuss his progress with his obesity treatment plan. He is on the Category 3 plan and is following his eating plan approximately 80 % of the time. He states he is walking and doing house work 5 times per week. Travis Blanchard plans to have bariatric surgery in the near future (Travis Blanchard). He is working on the green light. We were unable to weigh the patient today for this TeleHealth visit. He feels as if he has maintained weight since his last visit. He has gained 6 lbs since starting treatment with [name].  Diabetes II controlled with neuropathy Travis Blanchard has a diagnosis of diabetes type II. Travis Blanchard is taking Metformin and he is taking Neurontin 100 mg 1 to 3 tablets at night for pain. He has been working on intensive lifestyle modifications including diet, exercise, and weight loss to help control his blood glucose levels.  Hypertension Travis Blanchard is a 73 y.o. male with hypertension. He is taking Lisinopril. Travis Blanchard denies chest pain or shortness of breath on exertion. He is working weight loss to help control his blood pressure with the goal of decreasing his risk of heart attack and stroke. Travis Blanchard blood pressure is currently controlled.  ASSESSMENT AND PLAN:  Essential hypertension  Type 2 diabetes mellitus with diabetic neuropathy, without long-term current use of insulin (HCC)  Class 3 severe obesity with serious comorbidity and body mass  index (BMI) of 40.0 to 44.9 in adult, unspecified obesity type ([place])  PLAN:  Diabetes II controlled with neuropathy Travis Blanchard has been given extensive diabetes education by myself today including ideal fasting and post-prandial blood glucose readings, individual ideal Hgb A1c goals and hypoglycemia prevention. We discussed the importance of good blood sugar control to decrease the likelihood of diabetic complications such as nephropathy, neuropathy, limb loss, blindness, coronary artery disease, and death. We discussed the importance of intensive lifestyle modification including diet, exercise and weight loss as the first line treatment for diabetes. Travis Blanchard agrees to continue his diabetes medications and he will continue Neurontin. Travis Blanchard will follow up at the agreed upon time.  Hypertension We discussed sodium restriction, working on healthy weight loss, and a regular exercise program as the means to achieve improved blood pressure control. Travis Blanchard agreed with this plan and agreed to follow up as directed. We will continue to monitor his blood pressure as well as his progress with the above lifestyle modifications. He will continue his medications as prescribed and will watch for signs of hypotension as he continues his lifestyle modifications.  Obesity Travis Blanchard is currently in the action stage of change. As such, his goal is to continue with weight loss efforts He has agreed to follow the Category 3 plan Travis Blanchard will continue to walk for weight loss and overall health benefits. We discussed the following Behavioral Modification Strategies today: increase H2O intake, no skipping meals, keeping healthy foods in the home, increasing lean protein intake, decreasing simple carbohydrates, increasing vegetables, decrease eating out and work  on meal planning and intentional eating  Travis Blanchard has agreed to follow up with our clinic in 2 weeks. He was informed of the importance of frequent follow up visits to maximize his success  with intensive lifestyle modifications for his multiple health conditions.  ALLERGIES: Allergies  Allergen Reactions  . Penicillins Hives, Itching and Other (See Comments)    Has patient had a PCN reaction causing immediate rash, facial/tongue/throat swelling, SOB or lightheadedness with hypotension: Yes Has patient had a PCN reaction causing severe rash involving mucus membranes or skin necrosis: No Has patient had a PCN reaction that required hospitalization: No Has patient had a PCN reaction occurring within the last 10 years: No If all of the above answers are "NO", then may proceed with Cephalosporin use.     MEDICATIONS: Current Outpatient Medications on File Prior to Visit  Medication Sig Dispense Refill  . acetaminophen (TYLENOL) 500 MG tablet Take 1,000 mg by mouth every 6 (six) hours as needed.    [name] albuterol (VENTOLIN HFA) 108 (90 Base) MCG/ACT inhaler Inhale 1 puff into the lungs every 6 (six) hours as needed for wheezing or shortness of breath. 1 Inhaler 0  . atorvastatin (LIPITOR) 20 MG tablet Take 20 mg by mouth daily.     . Cholecalciferol (VITAMIN D3) 1000 units CAPS Take by mouth.    . fluticasone (FLONASE) 50 MCG/ACT nasal spray Place into both nostrils daily.    [name] ipratropium-albuterol (DUONEB) 0.5-2.5 (3) MG/3ML SOLN Take 3 mLs by nebulization.    [name] lisinopril (PRINIVIL,ZESTRIL) 30 MG tablet Take 30 mg by mouth daily.     [name] loratadine (CLARITIN) 10 MG tablet Take 10 mg by mouth daily.    . metFORMIN (GLUCOPHAGE) 500 MG tablet Take 500 mg by mouth 2 (two) times daily with a meal.     . naproxen sodium (ALEVE) 220 MG tablet Take 220 mg by mouth at bedtime.    . psyllium (METAMUCIL) 58.6 % powder Take 1 packet by mouth at bedtime. SUGAR FREE ORANGE FLAVORED    . vitamin B-12 (CYANOCOBALAMIN) 1000 MCG tablet Take 1,000 mcg by mouth daily.    [name] zolpidem (AMBIEN) 5 MG tablet Take 5 mg by mouth at bedtime.      No current facility-administered medications on file prior to  visit.     PAST MEDICAL HISTORY: Past Medical History:  Diagnosis Date  . Arthritis   . Asthma   . B12 deficiency   . Cancer [facility])    Prostate  . COPD (chronic obstructive pulmonary disease) ([place])   . Cough   . Decreased hearing   . Diabetes mellitus without complication ([place])   . Floaters in visual field   . Frequent urination   . Hoarseness   . Hyperlipidemia   . Hypertension   . Joint pain   . Lower back pain   . Muscle pain   . Muscle stiffness   . Neck stiffness   . Right hip pain   . Right knee pain   . Shortness of breath    with sudden awakening from sleep  . Tinnitus   . Trouble in sleeping   . Umbilical hernia   . Vitamin D deficiency   . Wheezing     PAST SURGICAL HISTORY: Past Surgical History:  Procedure Laterality Date  . COLONOSCOPY  2008   Dr. Gala Romney: 2 diminutive polyps in rectum (hyperplastic), otherwise normal  . COLONOSCOPY  2000   Per notes in epic, history of adenomas at  outside facility  . COLONOSCOPY WITH PROPOFOL N/A 07/18/2017   Procedure: COLONOSCOPY WITH PROPOFOL;  Surgeon: Daneil Dolin, MD;  [facility];  Service: Endoscopy;  Laterality: N/A;  8:45  . HEMORRHOID SURGERY    . KNEE ARTHROSCOPY     Left  . KNEE ARTHROSCOPY     right  . PROSTATE SURGERY  2000   Prostate removal/Cancer    SOCIAL HISTORY: Social History   Tobacco Use  . Smoking status: Former Smoker    Types: Cigarettes    Quit date: 03/11/2002    Years since quitting: 16.8  . Smokeless tobacco: Never Used  Substance Use Topics  . Alcohol use: No    Frequency: Never  . Drug use: No    FAMILY HISTORY: Family History  Problem Relation Age of Onset  . Diabetes Mother   . Hypertension Mother   . Hyperlipidemia Mother   . Stroke Mother   . Heart disease Mother   . Depression Mother   . Obesity Mother   . Eating disorder Mother   . Cancer Father   . Depression Father   . Anxiety disorder Father   . Liver disease Father   . Alcoholism  Father   . Cancer Sister        Breast  . Colon cancer Neg Hx     ROS: Review of Systems  Constitutional: Negative for weight loss.  Respiratory: Negative for shortness of breath (on exertion).   Cardiovascular: Negative for chest pain.    PHYSICAL EXAM: Pt in no acute distress  RECENT LABS AND TESTS: BMET    Component Value Date/Time   NA 139 07/15/2018 2200   NA 144 03/25/2018 1041   K 4.0 07/15/2018 2200   CL 106 07/15/2018 2200   CO2 25 07/15/2018 2200   GLUCOSE 166 (H) 07/15/2018 2200   BUN 11 07/15/2018 2200   BUN 12 03/25/2018 1041   CREATININE 0.89 07/15/2018 2200   CALCIUM 9.3 07/15/2018 2200   GFRNONAA >60 07/15/2018 2200   GFRAA >60 07/15/2018 2200   Lab Results  Component Value Date   HGBA1C 6.6 (H) 03/25/2018   No results found for: INSULIN CBC    Component Value Date/Time   WBC 10.1 07/15/2018 2200   RBC 5.09 07/15/2018 2200   HGB 14.5 07/15/2018 2200   HCT 45.9 07/15/2018 2200   PLT 209 07/15/2018 2200   MCV 90.2 07/15/2018 2200   MCH 28.5 07/15/2018 2200   MCHC 31.6 07/15/2018 2200   RDW 12.3 07/15/2018 2200   LYMPHSABS 2.8 07/12/2017 0850   MONOABS 0.3 07/12/2017 0850   EOSABS 0.1 07/12/2017 0850   BASOSABS 0.0 07/12/2017 0850   Iron/TIBC/Ferritin/ %Sat No results found for: IRON, TIBC, FERRITIN, IRONPCTSAT Lipid Panel     Component Value Date/Time   CHOL 170 03/25/2018 1041   TRIG 88 03/25/2018 1041   HDL 52 03/25/2018 1041   CHOLHDL 3.3 03/25/2018 1041   LDLCALC 100 (H) 03/25/2018 1041   Hepatic Function Panel     Component Value Date/Time   PROT 6.9 03/25/2018 1041   ALBUMIN 4.5 03/25/2018 1041   AST 15 03/25/2018 1041   ALT 16 03/25/2018 1041   ALKPHOS 92 03/25/2018 1041   BILITOT 0.3 03/25/2018 1041   No results found for: TSH    Ref. Range 03/25/2018 10:41  Vitamin D, 25-Hydroxy Latest Ref Range: 30.0 - 100.0 ng/mL 42.6    I, Doreene Nest, am acting as Location manager for [employer]. Owens Shark,  DO  I have reviewed  the above documentation for accuracy and completeness, and I agree with the above. -Jearld Lesch, DO

## 2019-01-27 ENCOUNTER — Telehealth (INDEPENDENT_AMBULATORY_CARE_PROVIDER_SITE_OTHER): Payer: Medicare Other | Admitting: Bariatrics

## 2019-01-27 DIAGNOSIS — E119 Type 2 diabetes mellitus without complications: Secondary | ICD-10-CM | POA: Diagnosis not present

## 2019-01-27 DIAGNOSIS — I10 Essential (primary) hypertension: Secondary | ICD-10-CM | POA: Diagnosis not present

## 2019-01-27 DIAGNOSIS — K429 Umbilical hernia without obstruction or gangrene: Secondary | ICD-10-CM | POA: Diagnosis not present

## 2019-01-27 DIAGNOSIS — E669 Obesity, unspecified: Secondary | ICD-10-CM | POA: Diagnosis not present

## 2019-02-03 ENCOUNTER — Encounter (INDEPENDENT_AMBULATORY_CARE_PROVIDER_SITE_OTHER): Payer: Self-pay | Admitting: Bariatrics

## 2019-02-03 ENCOUNTER — Other Ambulatory Visit: Payer: Self-pay

## 2019-02-03 ENCOUNTER — Telehealth (INDEPENDENT_AMBULATORY_CARE_PROVIDER_SITE_OTHER): Payer: Medicare Other | Admitting: Bariatrics

## 2019-02-03 DIAGNOSIS — I10 Essential (primary) hypertension: Secondary | ICD-10-CM | POA: Diagnosis not present

## 2019-02-03 DIAGNOSIS — Z6841 Body Mass Index (BMI) 40.0 and over, adult: Secondary | ICD-10-CM | POA: Diagnosis not present

## 2019-02-03 DIAGNOSIS — E1140 Type 2 diabetes mellitus with diabetic neuropathy, unspecified: Secondary | ICD-10-CM | POA: Diagnosis not present

## 2019-02-03 NOTE — Progress Notes (Signed)
Office: [phone]  /  Fax: [phone] TeleHealth Visit:  Travis Blanchard has verbally consented to this TeleHealth visit today. The patient is located at home, the provider is located at the [employer] [facility] office. The participants in this visit include the listed provider and patient. The visit was conducted today via telephone call.  HPI:   Chief Complaint: OBESITY Travis Blanchard is here to discuss his progress with his obesity treatment plan. He is on the Category 3 plan and is following his eating plan approximately 75% of the time. He states he is exercising 0 minutes 0 times per week. Athel states he has lost 7-8 lbs (weight 303). He is moving through the presurgical process for bariatric surgery with [facility]. We were unable to weigh the patient today for this TeleHealth visit. He feels as if he has lost 7-8 lbs since his last visit. He has lost 0 lbs since starting treatment with [name].  Hypertension Travis Blanchard is a 73 y.o. male with hypertension. [name] denies chest pain or shortness of breath on exertion. He is working weight loss to help control his blood pressure with the goal of decreasing his risk of heart attack and stroke. Geary's blood pressure is well controlled.  Diabetes II Travis Blanchard has a diagnosis of diabetes type II, which is controlled. Travis Blanchard states he has normal blood sugar readings. Last A1c was 6.6 on 03/25/2018. He has been working on intensive lifestyle modifications including diet, exercise, and weight loss to help control his blood glucose levels.  ASSESSMENT AND PLAN:  Essential hypertension  Controlled type 2 diabetes mellitus with diabetic neuropathy, without long-term current use of insulin (HCC)  Class 3 severe obesity with serious comorbidity and body mass index (BMI) of 40.0 to 44.9 in adult, unspecified obesity type (Travis Blanchard)  PLAN:  Hypertension We discussed sodium restriction, working on healthy weight loss, and a regular  exercise program as the means to achieve improved blood pressure control. Izmael agreed with this plan and agreed to follow up as directed. We will continue to monitor his blood pressure as well as his progress with the above lifestyle modifications. He will continue his medications as prescribed and will watch for signs of hypotension as he continues his lifestyle modifications.  Diabetes II Travis Blanchard has been given extensive diabetes education by myself today including ideal fasting and post-prandial blood glucose readings, individual ideal HgA1c goals  and hypoglycemia prevention. We discussed the importance of good blood sugar control to decrease the likelihood of diabetic complications such as nephropathy, neuropathy, limb loss, blindness, coronary artery disease, and death. We discussed the importance of intensive lifestyle modification including diet, exercise and weight loss as the first line treatment for diabetes. Travis Blanchard agrees to continue his diabetes medications and will follow-up at the agreed upon time.  Obesity Travis Blanchard is currently in the action stage of change. As such, his goal is to continue with weight loss efforts. He has agreed to follow the Category 3 plan. Travis Blanchard will work on [employer], intentional eating, and will follow-up with [facility]. Travis Blanchard has been instructed to work up to a goal of 150 minutes of combined cardio and strengthening exercise per week for weight loss and overall health benefits. We discussed the following Behavioral Modification Strategies today: increasing lean protein intake, decreasing simple carbohydrates, increasing vegetables, increase H20 intake, decrease eating out, no skipping meals, work on meal planning and easy cooking plans, keeping healthy foods in the home, and planning for  success.  Travis Blanchard has agreed to follow-up with our clinic in 2 weeks. He was informed of the importance of frequent follow-up visits to maximize his success with intensive  lifestyle modifications for his multiple health conditions.  ALLERGIES: Allergies  Allergen Reactions   Penicillins Hives, Itching and Other (See Comments)    Has patient had a PCN reaction causing immediate rash, facial/tongue/throat swelling, SOB or lightheadedness with hypotension: Yes Has patient had a PCN reaction causing severe rash involving mucus membranes or skin necrosis: No Has patient had a PCN reaction that required hospitalization: No Has patient had a PCN reaction occurring within the last 10 years: No If all of the above answers are "NO", then may proceed with Cephalosporin use.     MEDICATIONS: Current Outpatient Medications on File Prior to Visit  Medication Sig Dispense Refill   acetaminophen (TYLENOL) 500 MG tablet Take 1,000 mg by mouth every 6 (six) hours as needed.     albuterol (VENTOLIN HFA) 108 (90 Base) MCG/ACT inhaler Inhale 1 puff into the lungs every 6 (six) hours as needed for wheezing or shortness of breath. 1 Inhaler 0   atorvastatin (LIPITOR) 20 MG tablet Take 20 mg by mouth daily.      Cholecalciferol (VITAMIN D3) 1000 units CAPS Take by mouth.     fluticasone (FLONASE) 50 MCG/ACT nasal spray Place into both nostrils daily.     ipratropium-albuterol (DUONEB) 0.5-2.5 (3) MG/3ML SOLN Take 3 mLs by nebulization.     lisinopril (PRINIVIL,ZESTRIL) 30 MG tablet Take 30 mg by mouth daily.      loratadine (CLARITIN) 10 MG tablet Take 10 mg by mouth daily.     metFORMIN (GLUCOPHAGE) 500 MG tablet Take 500 mg by mouth 2 (two) times daily with a meal.      naproxen sodium (ALEVE) 220 MG tablet Take 220 mg by mouth at bedtime.     psyllium (METAMUCIL) 58.6 % powder Take 1 packet by mouth at bedtime. SUGAR FREE ORANGE FLAVORED     vitamin B-12 (CYANOCOBALAMIN) 1000 MCG tablet Take 1,000 mcg by mouth daily.     zolpidem (AMBIEN) 5 MG tablet Take 5 mg by mouth at bedtime.      No current facility-administered medications on file prior to visit.      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   COPD (chronic obstructive pulmonary disease) (HCC)    Cough    Decreased hearing    Diabetes mellitus without complication (HCC)    Floaters in visual field    Frequent urination    Hoarseness    Hyperlipidemia    Hypertension    Joint pain    Lower back pain    Muscle pain    Muscle stiffness    Neck stiffness    Right hip pain    Right knee pain    Shortness of breath    with sudden awakening from sleep   Tinnitus    Trouble in sleeping    Umbilical hernia    Vitamin D deficiency    Wheezing     PAST SURGICAL HISTORY: Past Surgical History:  Procedure Laterality Date   COLONOSCOPY  2008   Dr. Gala Romney: 2 diminutive polyps in rectum (hyperplastic), otherwise normal   COLONOSCOPY  2000   Per notes in epic, history of adenomas at outside facility   COLONOSCOPY WITH PROPOFOL  N/A 07/18/2017   Procedure: COLONOSCOPY WITH PROPOFOL;  Surgeon: Daneil Dolin, MD;  Location: [facility];  Service: Endoscopy;  Laterality: N/A;  8:45   HEMORRHOID SURGERY     KNEE ARTHROSCOPY     Left   KNEE ARTHROSCOPY     right   PROSTATE SURGERY  2000   Prostate removal/Cancer    SOCIAL HISTORY: Social History   Tobacco Use   Smoking status: Former Smoker    Types: Cigarettes    Quit date: 03/11/2002    Years since quitting: 16.9   Smokeless tobacco: Never Used  Substance Use Topics   Alcohol use: No    Frequency: Never   Drug use: No    FAMILY HISTORY: Family History  Problem Relation Age of Onset   Diabetes Mother    Hypertension Mother    Hyperlipidemia Mother    Stroke Mother    Heart disease Mother    Depression Mother    Obesity Mother    Eating disorder Mother    Cancer Father    Depression Father    Anxiety disorder Father    Liver disease Father    Alcoholism Father     Cancer Sister        Breast   Colon cancer Neg Hx    ROS: ROS none noted.  PHYSICAL EXAM: Pt in no acute distress  RECENT LABS AND TESTS: BMET    Component Value Date/Time   NA 139 07/15/2018 2200   NA 144 03/25/2018 1041   K 4.0 07/15/2018 2200   CL 106 07/15/2018 2200   CO2 25 07/15/2018 2200   GLUCOSE 166 (H) 07/15/2018 2200   BUN 11 07/15/2018 2200   BUN 12 03/25/2018 1041   CREATININE 0.89 07/15/2018 2200   CALCIUM 9.3 07/15/2018 2200   GFRNONAA >60 07/15/2018 2200   GFRAA >60 07/15/2018 2200   Lab Results  Component Value Date   HGBA1C 6.6 (H) 03/25/2018   No results found for: INSULIN CBC    Component Value Date/Time   WBC 10.1 07/15/2018 2200   RBC 5.09 07/15/2018 2200   HGB 14.5 07/15/2018 2200   HCT 45.9 07/15/2018 2200   PLT 209 07/15/2018 2200   MCV 90.2 07/15/2018 2200   MCH 28.5 07/15/2018 2200   MCHC 31.6 07/15/2018 2200   RDW 12.3 07/15/2018 2200   LYMPHSABS 2.8 07/12/2017 0850   MONOABS 0.3 07/12/2017 0850   EOSABS 0.1 07/12/2017 0850   BASOSABS 0.0 07/12/2017 0850   Iron/TIBC/Ferritin/ %Sat No results found for: IRON, TIBC, FERRITIN, IRONPCTSAT Lipid Panel     Component Value Date/Time   CHOL 170 03/25/2018 1041   TRIG 88 03/25/2018 1041   HDL 52 03/25/2018 1041   CHOLHDL 3.3 03/25/2018 1041   LDLCALC 100 (H) 03/25/2018 1041   Hepatic Function Panel     Component Value Date/Time   PROT 6.9 03/25/2018 1041   ALBUMIN 4.5 03/25/2018 1041   AST 15 03/25/2018 1041   ALT 16 03/25/2018 1041   ALKPHOS 92 03/25/2018 1041   BILITOT 0.3 03/25/2018 1041   No results found for: TSH  Results for NAPHTALI, ZYWICKI [address] (MRN [id number]) as of 02/03/2019 12:28  Ref. Range 03/25/2018 10:41  Vitamin D, 25-Hydroxy Latest Ref Range: 30.0 - 100.0 ng/mL 42.6   I, Michaelene Song, am acting as Location manager for [employer], DO  I have reviewed the above documentation for accuracy and completeness, and I agree with the above. -Jearld Lesch,  DO

## 2019-02-04 ENCOUNTER — Ambulatory Visit: Payer: PRIVATE HEALTH INSURANCE | Admitting: Gastroenterology

## 2019-03-04 DIAGNOSIS — E1165 Type 2 diabetes mellitus with hyperglycemia: Secondary | ICD-10-CM | POA: Diagnosis not present

## 2019-03-04 DIAGNOSIS — I10 Essential (primary) hypertension: Secondary | ICD-10-CM | POA: Diagnosis not present

## 2019-04-04 DIAGNOSIS — I10 Essential (primary) hypertension: Secondary | ICD-10-CM | POA: Diagnosis not present

## 2019-04-04 DIAGNOSIS — E1165 Type 2 diabetes mellitus with hyperglycemia: Secondary | ICD-10-CM | POA: Diagnosis not present

## 2019-04-16 DIAGNOSIS — H5203 Hypermetropia, bilateral: Secondary | ICD-10-CM | POA: Diagnosis not present

## 2019-04-16 DIAGNOSIS — Z7984 Long term (current) use of oral hypoglycemic drugs: Secondary | ICD-10-CM | POA: Diagnosis not present

## 2019-04-16 DIAGNOSIS — H2513 Age-related nuclear cataract, bilateral: Secondary | ICD-10-CM | POA: Diagnosis not present

## 2019-04-16 DIAGNOSIS — H524 Presbyopia: Secondary | ICD-10-CM | POA: Diagnosis not present

## 2019-04-16 DIAGNOSIS — H25013 Cortical age-related cataract, bilateral: Secondary | ICD-10-CM | POA: Diagnosis not present

## 2019-04-16 DIAGNOSIS — E119 Type 2 diabetes mellitus without complications: Secondary | ICD-10-CM | POA: Diagnosis not present

## 2019-04-16 DIAGNOSIS — H52203 Unspecified astigmatism, bilateral: Secondary | ICD-10-CM | POA: Diagnosis not present

## 2019-04-24 DIAGNOSIS — Z23 Encounter for immunization: Secondary | ICD-10-CM | POA: Diagnosis not present

## 2019-05-04 DIAGNOSIS — E1165 Type 2 diabetes mellitus with hyperglycemia: Secondary | ICD-10-CM | POA: Diagnosis not present

## 2019-05-04 DIAGNOSIS — I10 Essential (primary) hypertension: Secondary | ICD-10-CM | POA: Diagnosis not present

## 2019-07-21 DIAGNOSIS — I10 Essential (primary) hypertension: Secondary | ICD-10-CM | POA: Diagnosis not present

## 2019-07-21 DIAGNOSIS — E119 Type 2 diabetes mellitus without complications: Secondary | ICD-10-CM | POA: Diagnosis not present

## 2019-07-21 DIAGNOSIS — Z1389 Encounter for screening for other disorder: Secondary | ICD-10-CM | POA: Diagnosis not present

## 2019-07-21 DIAGNOSIS — E785 Hyperlipidemia, unspecified: Secondary | ICD-10-CM | POA: Diagnosis not present

## 2019-07-21 DIAGNOSIS — Z1331 Encounter for screening for depression: Secondary | ICD-10-CM | POA: Diagnosis not present

## 2019-07-22 DIAGNOSIS — Z0001 Encounter for general adult medical examination with abnormal findings: Secondary | ICD-10-CM | POA: Diagnosis not present

## 2019-07-22 DIAGNOSIS — E782 Mixed hyperlipidemia: Secondary | ICD-10-CM | POA: Diagnosis not present

## 2019-07-22 DIAGNOSIS — E119 Type 2 diabetes mellitus without complications: Secondary | ICD-10-CM | POA: Diagnosis not present

## 2019-08-21 DIAGNOSIS — I10 Essential (primary) hypertension: Secondary | ICD-10-CM | POA: Diagnosis not present

## 2019-08-21 DIAGNOSIS — E1165 Type 2 diabetes mellitus with hyperglycemia: Secondary | ICD-10-CM | POA: Diagnosis not present

## 2019-09-18 DIAGNOSIS — I10 Essential (primary) hypertension: Secondary | ICD-10-CM | POA: Diagnosis not present

## 2019-09-18 DIAGNOSIS — E1165 Type 2 diabetes mellitus with hyperglycemia: Secondary | ICD-10-CM | POA: Diagnosis not present

## 2019-09-22 DIAGNOSIS — E1165 Type 2 diabetes mellitus with hyperglycemia: Secondary | ICD-10-CM | POA: Diagnosis not present

## 2019-09-22 DIAGNOSIS — T783XXA Angioneurotic edema, initial encounter: Secondary | ICD-10-CM | POA: Diagnosis not present

## 2019-09-22 DIAGNOSIS — T464X5A Adverse effect of angiotensin-converting-enzyme inhibitors, initial encounter: Secondary | ICD-10-CM | POA: Diagnosis not present

## 2019-09-22 DIAGNOSIS — I10 Essential (primary) hypertension: Secondary | ICD-10-CM | POA: Diagnosis not present

## 2019-09-23 DIAGNOSIS — M25562 Pain in left knee: Secondary | ICD-10-CM | POA: Diagnosis not present

## 2019-09-23 DIAGNOSIS — M25551 Pain in right hip: Secondary | ICD-10-CM | POA: Diagnosis not present

## 2019-09-23 DIAGNOSIS — M25561 Pain in right knee: Secondary | ICD-10-CM | POA: Diagnosis not present

## 2019-09-23 DIAGNOSIS — M170 Bilateral primary osteoarthritis of knee: Secondary | ICD-10-CM | POA: Diagnosis not present

## 2019-10-23 DIAGNOSIS — I10 Essential (primary) hypertension: Secondary | ICD-10-CM | POA: Diagnosis not present

## 2019-10-23 DIAGNOSIS — E1165 Type 2 diabetes mellitus with hyperglycemia: Secondary | ICD-10-CM | POA: Diagnosis not present

## 2019-11-19 ENCOUNTER — Other Ambulatory Visit: Payer: Self-pay

## 2019-11-19 ENCOUNTER — Ambulatory Visit (HOSPITAL_COMMUNITY)
Admission: RE | Admit: 2019-11-19 | Discharge: 2019-11-19 | Disposition: A | Discharge status: Home / Self Care | Payer: Medicare Other | Source: Ambulatory Visit | Attending: Internal Medicine | Admitting: Internal Medicine

## 2019-11-19 ENCOUNTER — Other Ambulatory Visit (HOSPITAL_COMMUNITY): Payer: Self-pay | Admitting: Internal Medicine

## 2019-11-19 DIAGNOSIS — I10 Essential (primary) hypertension: Secondary | ICD-10-CM | POA: Diagnosis not present

## 2019-11-19 DIAGNOSIS — M549 Dorsalgia, unspecified: Secondary | ICD-10-CM

## 2019-11-19 DIAGNOSIS — E119 Type 2 diabetes mellitus without complications: Secondary | ICD-10-CM | POA: Diagnosis not present

## 2019-11-19 DIAGNOSIS — M25551 Pain in right hip: Secondary | ICD-10-CM | POA: Diagnosis not present

## 2019-11-19 DIAGNOSIS — M1611 Unilateral primary osteoarthritis, right hip: Secondary | ICD-10-CM | POA: Diagnosis not present

## 2019-11-19 DIAGNOSIS — M545 Low back pain: Secondary | ICD-10-CM | POA: Diagnosis not present

## 2019-12-20 DIAGNOSIS — E1165 Type 2 diabetes mellitus with hyperglycemia: Secondary | ICD-10-CM | POA: Diagnosis not present

## 2019-12-20 DIAGNOSIS — I10 Essential (primary) hypertension: Secondary | ICD-10-CM | POA: Diagnosis not present

## 2019-12-21 DIAGNOSIS — M5136 Other intervertebral disc degeneration, lumbar region: Secondary | ICD-10-CM | POA: Diagnosis not present

## 2019-12-21 DIAGNOSIS — M47816 Spondylosis without myelopathy or radiculopathy, lumbar region: Secondary | ICD-10-CM | POA: Diagnosis not present

## 2020-01-20 DIAGNOSIS — M47816 Spondylosis without myelopathy or radiculopathy, lumbar region: Secondary | ICD-10-CM | POA: Diagnosis not present

## 2020-02-04 DIAGNOSIS — I10 Essential (primary) hypertension: Secondary | ICD-10-CM | POA: Diagnosis not present

## 2020-02-04 DIAGNOSIS — K429 Umbilical hernia without obstruction or gangrene: Secondary | ICD-10-CM | POA: Diagnosis not present

## 2020-02-04 DIAGNOSIS — E1165 Type 2 diabetes mellitus with hyperglycemia: Secondary | ICD-10-CM | POA: Diagnosis not present

## 2020-03-06 DIAGNOSIS — I10 Essential (primary) hypertension: Secondary | ICD-10-CM | POA: Diagnosis not present

## 2020-03-06 DIAGNOSIS — E1165 Type 2 diabetes mellitus with hyperglycemia: Secondary | ICD-10-CM | POA: Diagnosis not present

## 2020-04-06 DIAGNOSIS — E119 Type 2 diabetes mellitus without complications: Secondary | ICD-10-CM | POA: Diagnosis not present

## 2020-04-06 DIAGNOSIS — I10 Essential (primary) hypertension: Secondary | ICD-10-CM | POA: Diagnosis not present

## 2020-04-25 DIAGNOSIS — H25013 Cortical age-related cataract, bilateral: Secondary | ICD-10-CM | POA: Diagnosis not present

## 2020-04-25 DIAGNOSIS — E1136 Type 2 diabetes mellitus with diabetic cataract: Secondary | ICD-10-CM | POA: Diagnosis not present

## 2020-04-25 DIAGNOSIS — H524 Presbyopia: Secondary | ICD-10-CM | POA: Diagnosis not present

## 2020-04-25 DIAGNOSIS — H2513 Age-related nuclear cataract, bilateral: Secondary | ICD-10-CM | POA: Diagnosis not present

## 2020-05-06 DIAGNOSIS — I10 Essential (primary) hypertension: Secondary | ICD-10-CM | POA: Diagnosis not present

## 2020-05-06 DIAGNOSIS — E1165 Type 2 diabetes mellitus with hyperglycemia: Secondary | ICD-10-CM | POA: Diagnosis not present

## 2020-05-18 IMAGING — DX DG CHEST 2V
2 series · 2 of 2 positions shown · non-contrast
Comparison: 04/06/2013

CLINICAL DATA: Shortness of breath, right chest pain

EXAM:
CHEST - 2 VIEW

[chest pa]
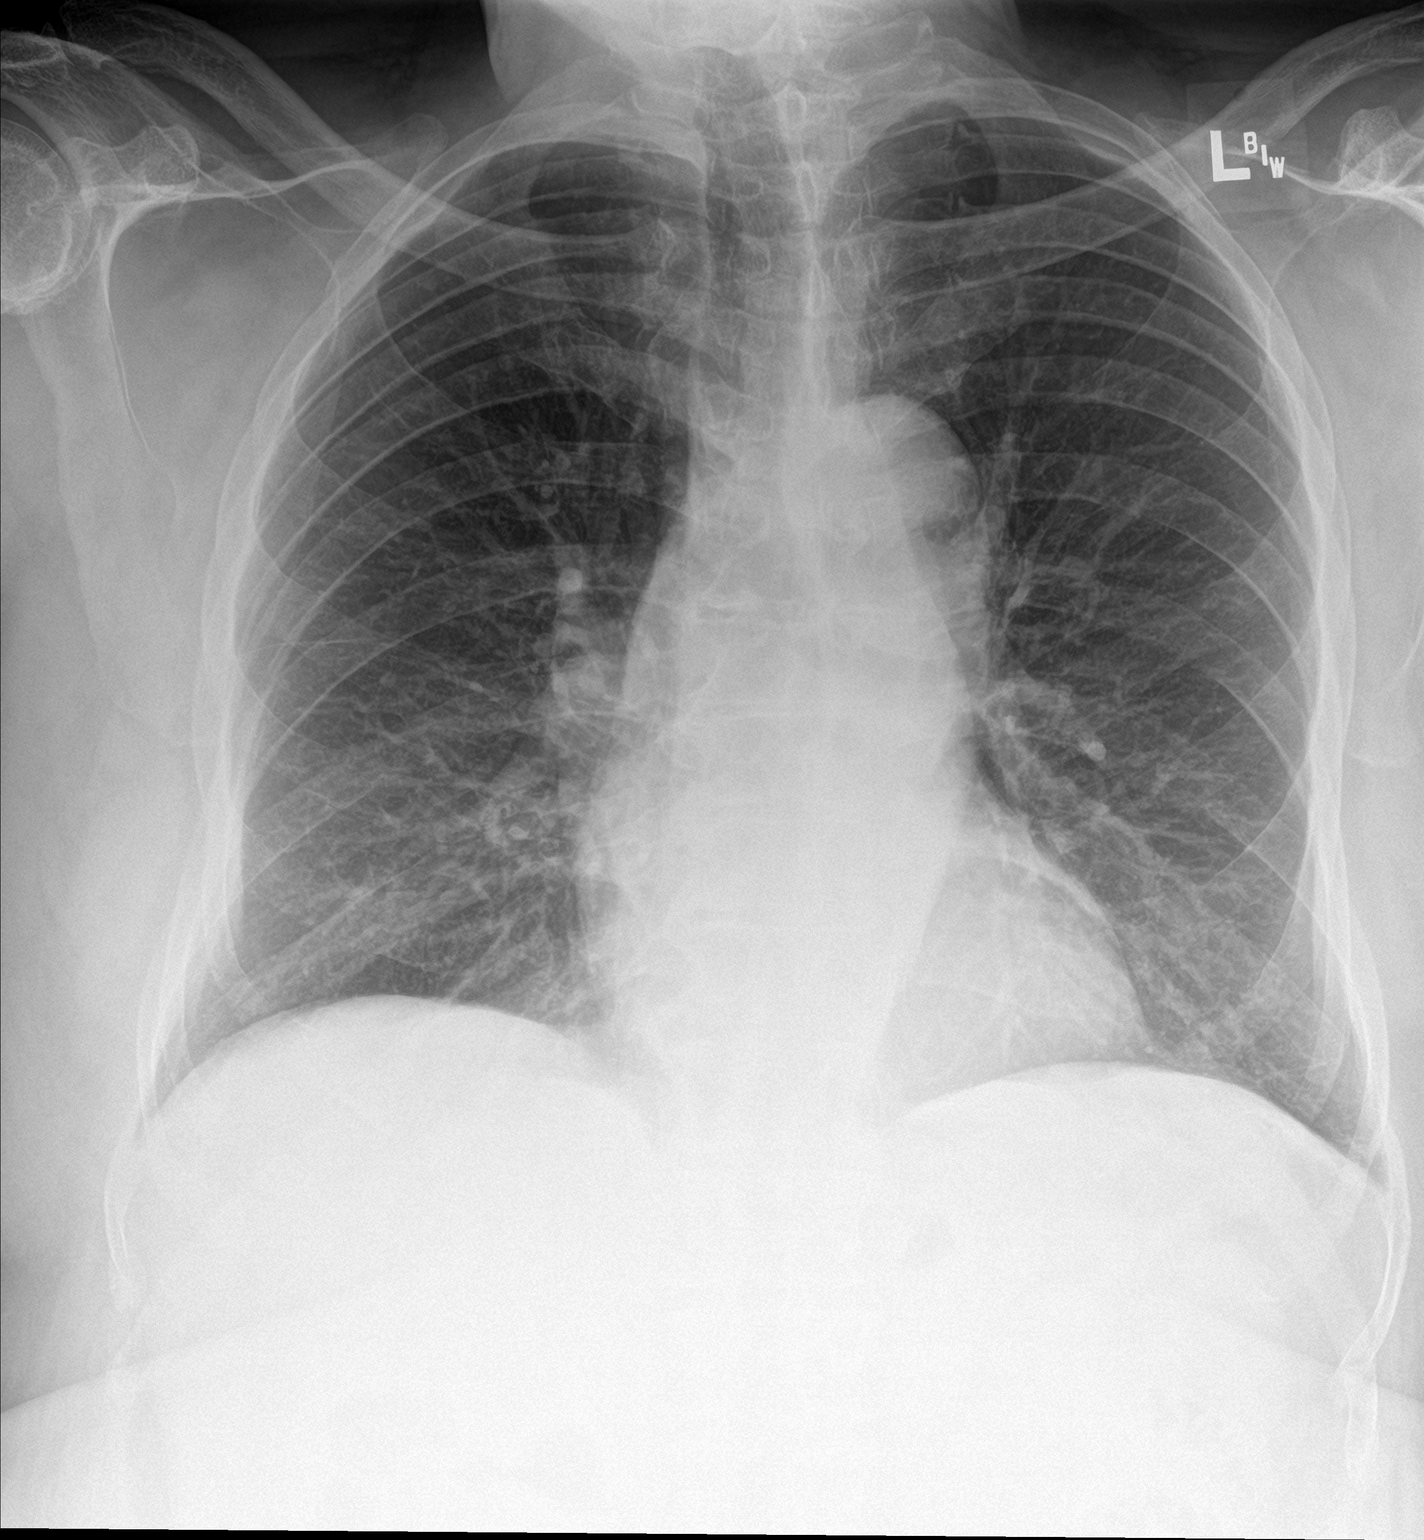

[chest lat]
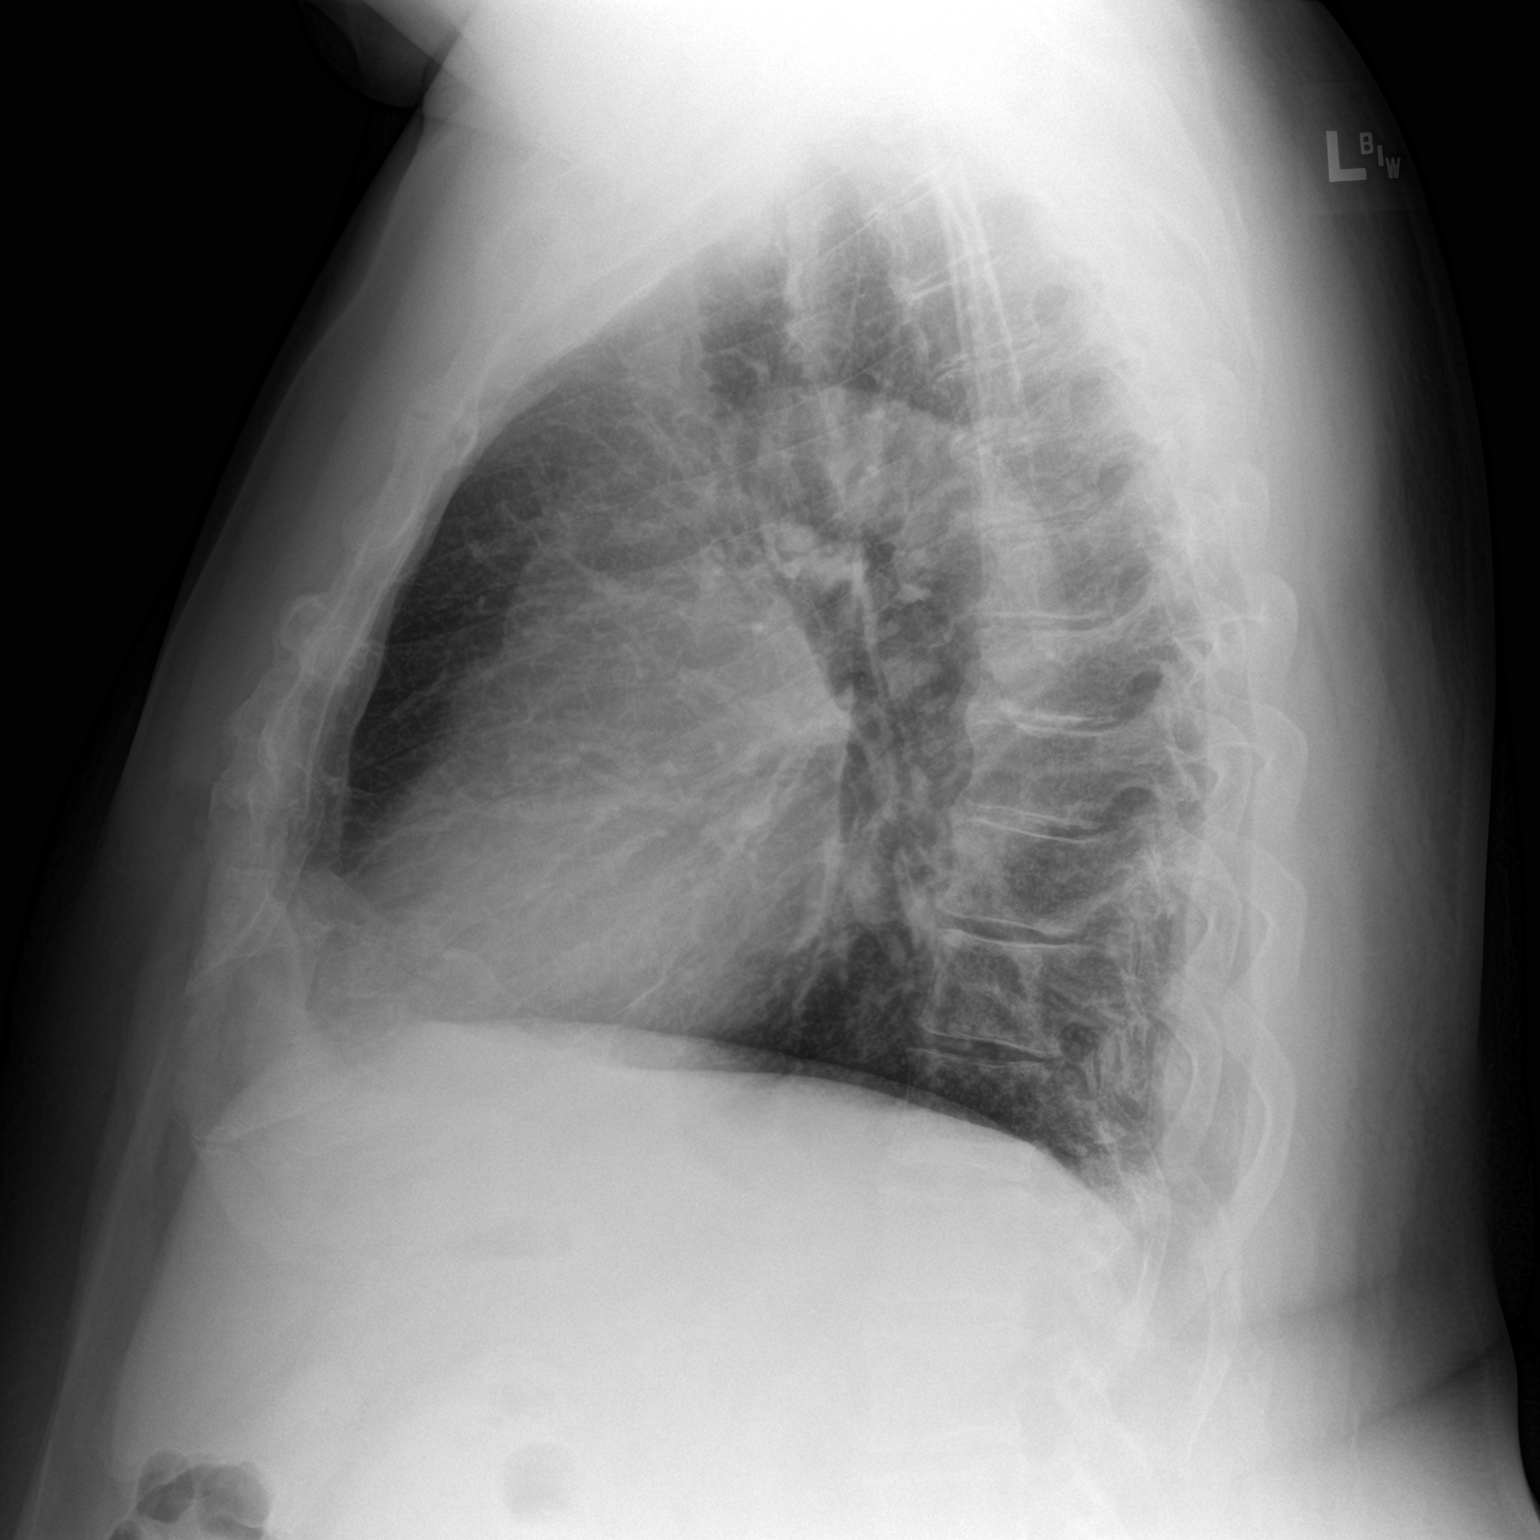

[2 of 2 positions shown; findings below may reference images not displayed]

FINDINGS: Heart and mediastinal contours are within normal limits. No focal
opacities or effusions. No acute bony abnormality.
IMPRESSION: No active cardiopulmonary disease.

## 2020-05-29 DIAGNOSIS — Z23 Encounter for immunization: Secondary | ICD-10-CM | POA: Diagnosis not present

## 2020-06-06 DIAGNOSIS — I10 Essential (primary) hypertension: Secondary | ICD-10-CM | POA: Diagnosis not present

## 2020-06-06 DIAGNOSIS — E1165 Type 2 diabetes mellitus with hyperglycemia: Secondary | ICD-10-CM | POA: Diagnosis not present

## 2020-06-07 DIAGNOSIS — M47816 Spondylosis without myelopathy or radiculopathy, lumbar region: Secondary | ICD-10-CM | POA: Diagnosis not present

## 2020-07-06 DIAGNOSIS — E785 Hyperlipidemia, unspecified: Secondary | ICD-10-CM | POA: Diagnosis not present

## 2020-07-06 DIAGNOSIS — E1165 Type 2 diabetes mellitus with hyperglycemia: Secondary | ICD-10-CM | POA: Diagnosis not present

## 2020-07-14 DIAGNOSIS — I10 Essential (primary) hypertension: Secondary | ICD-10-CM | POA: Diagnosis not present

## 2020-07-14 DIAGNOSIS — Z1389 Encounter for screening for other disorder: Secondary | ICD-10-CM | POA: Diagnosis not present

## 2020-07-14 DIAGNOSIS — Z23 Encounter for immunization: Secondary | ICD-10-CM | POA: Diagnosis not present

## 2020-07-14 DIAGNOSIS — E119 Type 2 diabetes mellitus without complications: Secondary | ICD-10-CM | POA: Diagnosis not present

## 2020-07-14 DIAGNOSIS — E785 Hyperlipidemia, unspecified: Secondary | ICD-10-CM | POA: Diagnosis not present

## 2020-07-14 DIAGNOSIS — Z1331 Encounter for screening for depression: Secondary | ICD-10-CM | POA: Diagnosis not present

## 2020-08-14 DIAGNOSIS — E119 Type 2 diabetes mellitus without complications: Secondary | ICD-10-CM | POA: Diagnosis not present

## 2020-08-14 DIAGNOSIS — I10 Essential (primary) hypertension: Secondary | ICD-10-CM | POA: Diagnosis not present

## 2020-09-07 ENCOUNTER — Ambulatory Visit (INDEPENDENT_AMBULATORY_CARE_PROVIDER_SITE_OTHER): Payer: Medicare Other | Admitting: Nurse Practitioner

## 2020-09-07 ENCOUNTER — Encounter: Payer: Self-pay | Admitting: Nurse Practitioner

## 2020-09-07 ENCOUNTER — Other Ambulatory Visit (INDEPENDENT_AMBULATORY_CARE_PROVIDER_SITE_OTHER): Payer: Medicare Other

## 2020-09-07 VITALS — BP 124/68 | HR 82 | Ht 70.5 in | Wt 293.1 lb

## 2020-09-07 DIAGNOSIS — K429 Umbilical hernia without obstruction or gangrene: Secondary | ICD-10-CM | POA: Diagnosis not present

## 2020-09-07 DIAGNOSIS — Z8619 Personal history of other infectious and parasitic diseases: Secondary | ICD-10-CM

## 2020-09-07 DIAGNOSIS — Z86010 Personal history of colonic polyps: Secondary | ICD-10-CM | POA: Diagnosis not present

## 2020-09-07 LAB — BASIC METABOLIC PANEL WITH GFR
BUN: 13 mg/dL (ref 6–23)
CO2: 28 meq/L (ref 19–32)
Calcium: 9.3 mg/dL (ref 8.4–10.5)
Chloride: 104 meq/L (ref 96–112)
Creatinine, Ser: 0.84 mg/dL (ref 0.40–1.50)
GFR: 86.83 mL/min
Glucose, Bld: 89 mg/dL (ref 70–99)
Potassium: 4 meq/L (ref 3.5–5.1)
Sodium: 137 meq/L (ref 135–145)

## 2020-09-07 NOTE — Patient Instructions (Signed)
If you are age 73 or older, your body mass index should be between 23-30. Your Body mass index is 41.46 kg/m. If this is out of the aforementioned range listed, please consider follow up with your Primary Care Provider.  Your provider has requested that you go to the basement level for lab work before leaving today. Press "B" on the elevator. The lab is located at the first door on the left as you exit the elevator.  We will request most recent labs from your primary care physician at your request.   Due to recent changes in healthcare laws, you may see the results of your imaging and laboratory studies on MyChart before your provider has had a chance to review them.  We understand that in some cases there may be results that are confusing or concerning to you. Not all laboratory results come back in the same time frame and the provider may be waiting for multiple results in order to interpret others.  Please give [name] 48 hours in order for your provider to thoroughly review all the results before contacting the office for clarification of your results.   You have been scheduled for a CT scan of the abdomen and pelvis at [facility], 1st floor Radiology. You are scheduled on 09/15/20  at 3:00pm. You should arrive 15 minutes prior to your appointment time for registration.The solution may taste better if refrigerated, but do NOT add ice or any other liquid to this solution. Shake well before drinking.   Please follow the written instructions below on the day of your exam:   1) Do not eat anything after 11:00am (4 hours prior to your test)   2) Drink 1 bottle of contrast @ 1:00pm (2 hours prior to your exam)           Drink 1 bottle of contrast @ 2:00pm (1 hour prior to your exam)   You may take any medications as prescribed with a small amount of water, if necessary. If you take any of the following medications: METFORMIN, GLUCOPHAGE, GLUCOVANCE, AVANDAMET, RIOMET, FORTAMET, [place] MET,  JANUMET, GLUMETZA or METAGLIP, you MAY be asked to HOLD this medication 48 hours AFTER the exam.   The purpose of you drinking the oral contrast is to aid in the visualization of your intestinal tract. The contrast solution may cause some diarrhea. Depending on your individual set of symptoms, you may also receive an intravenous injection of x-ray contrast/dye. Plan on being at [facility] for 45 minutes or longer, depending on the type of exam you are having performed.   If you have any questions regarding your exam or if you need to reschedule, you may call [facility] at [phone] between the hours of 8:00 am and 5:00 pm, Monday-Friday.   Thank you for choosing me and [facility].  Raymond

## 2020-09-07 NOTE — Progress Notes (Signed)
09/07/2020 Travis Blanchard [id number] June 22, 1946   CHIEF COMPLAINT: Umbilical hernia, ulcer check  HISTORY OF PRESENT ILLNESS: Travis Blanchard is a 75 year old male with a past medical history of arthritis, hypertension, hyperlipidemia, diabetes mellitus type 2, obesity, prostate cancer status post prostatectomy 2007 without radiation or chemotherapy, umbilical hernia, H. Pylori  and colon polyps.  He denies history of pulmonary disease/COPD. Past right knee arthroscopy and hemorrhoidectomy surgery.  He presents to our office today as referred by Dr. Legrand Rams for further evaluation regarding a large ventral/umbilical hernia.  He is accompanied by his wife.  The patient reported undergoing 2 colonoscopies in the past which were incomplete due to his large umbilical hernia.  He is concerned regarding his risk of colon cancer with a history of colon polyps with 2 incomplete colonoscopies.  He underwent a colonoscopy by Dr. Gala Blanchard in [place] 07/28/2017 which showed diverticulosis in the sigmoid and descending colon. A large abdominal wall hernia precluded the completion of the colonoscopy. He was referred to a GI at [facility]. He underwent a repeat colonoscopy  08/01/2017 at [facility] which identified a 66mm sessile polyp which was removed from the proximal transverse colon. The transverse colon revealed significantly excessive looping, the lumen in the proximal transverse colon beyond the polypectomy site appeared twisted and the scope could not be advanced beyond this area. Biopsies were consistent with an adenomatous colon polyp. A repeat colonoscopy after ventral hernia repair was recommended. No known family history of colon cancer. He reports passing a normal formed brown BM daily. No rectal bleeding or black stools. He denies having any abdominal pain due to his large protruding umbilical hernia.  No nausea or vomiting.  He stated seeing a general surgeon in [place] in 2019 who advised gastric bypass surgery to  lose weight then eventually repair the umbilical hernia.  He underwent an EGD 10/03/2017 at [facility] prior to pursuing bariatric surgery which identified reflux esophagitis, a small hiatal hernia, a benign appearing esophageal stenosis, gastritis and duodenitis. Biopsies were positive for H. Pylori which was treated. Post treatment H. Pylori stool antigen testing 12/2017 was negative. He decided not to purse bariatric surgery. No dysphagia or heartburn. No upper abdominal pain. He infrequently takes Omeprazole 20mg . He took Aleve 2 to 3 tabs once daily for 2 years then switched to Naproxen one tab every other day for the past 1 to 1 1/2 years. He stated he reduced his NSAID use due to concerns regarding stomach and kidney issues. He stated having routine labs done by his PCP 07/2020. I will request a copy of these results for further review. No other complaints at this time.   EGD at [facility] 10/03/2017: - LA Grade A reflux esophagitis.            - Benign-appearing esophageal stenoses.            - Z-line regular, 43 cm from the incisors.            - Small hiatal hernia.            - Gastritis. Biopsied.            - Duodenitis.  Biopsies:  A.  Stomach, biopsy - Chronic active Helicobacter pylori gastritis (multiple structures compatible with Helicobacter identified) - Negative for intestinal metaplasia, dysplasia and malignancy  Colonoscopy 08/01/2017 by Dr. Elspeth Blanchard:   Colonoscopy 07/18/2017:  - Diverticulosis in the sigmoid colon and in the descending colon. Large abdominal wall hernia precluded completion  of colonoscopy. - No specimens collected.  Past Medical History:  Diagnosis Date  . Arthritis   . Asthma   . B12 deficiency   . Cancer [facility])    Prostate  . COPD (chronic obstructive pulmonary disease) ([place])   . Cough   . Decreased hearing   . Diabetes mellitus without complication ([place])   . Floaters in visual field   . Frequent  urination   . Hoarseness   . Hyperlipidemia   . Hypertension   . Joint pain   . Lower back pain   . Muscle pain   . Muscle stiffness   . Neck stiffness   . Right hip pain   . Right knee pain   . Shortness of breath    with sudden awakening from sleep  . Tinnitus   . Trouble in sleeping   . Umbilical hernia   . Vitamin D deficiency   . Wheezing    Past Surgical History:  Procedure Laterality Date  . COLONOSCOPY  2008   Dr. Gala Blanchard: 2 diminutive polyps in rectum (hyperplastic), otherwise normal  . COLONOSCOPY  2000   Per notes in epic, history of adenomas at outside facility  . COLONOSCOPY WITH PROPOFOL N/A 07/18/2017   Procedure: COLONOSCOPY WITH PROPOFOL;  Surgeon: Travis Dolin, MD;  Location: [facility];  Service: Endoscopy;  Laterality: N/A;  8:45  . HEMORRHOID SURGERY    . KNEE ARTHROSCOPY     Left  . KNEE ARTHROSCOPY     right  . PROSTATE SURGERY  2000   Prostate removal/Cancer   Social History: He is married. He smoked cigarettes 1ppd x 30 years. He quit smoking 20 years. Past excessive alcohol use, reduced alcohol intake in 2004, now drinks 6 beers monthly. He smokes marijuana a few days weekly.   Family History: Mother deceased 73 with history of heart failure, diabetes and CVA. Father deceased age 80 from pancreatic cancer, alcoholism, liver disease. He has 3 living brothers. Sister with breast cancer.  Brother died from throat cancer.   Allergies  Allergen Reactions  . Lisinopril Swelling    Facial swelling  . Penicillins Hives, Itching, Other (See Comments), Anaphylaxis and Shortness Of Breath    Has patient had a PCN reaction causing immediate rash, facial/tongue/throat swelling, SOB or lightheadedness with hypotension: Yes Has patient had a PCN reaction causing severe rash involving mucus membranes or skin necrosis: No Has patient had a PCN reaction that required hospitalization: No Has patient had a PCN reaction occurring within the last 10 years: No If  all of the above answers are "NO", then may proceed with Cephalosporin use.  Other reaction(s): Other (See Comments) Has patient had a PCN reaction causing immediate rash, facial/tongue/throat swelling, SOB or lightheadedness with hypotension: Yes Has patient had a PCN reaction causing severe rash involving mucus membranes or skin necrosis: No Has patient had a PCN reaction that required hospitalization: No Has patient had a PCN reaction occurring within the last 10 years: No If all of the above answers are "NO", then may proceed with Cephalosporin use.       Outpatient Encounter Medications as of 09/07/2020  Medication Sig  . acetaminophen (TYLENOL) 500 MG tablet Take 1,000 mg by mouth every 6 (six) hours as needed.  [name] albuterol (VENTOLIN HFA) 108 (90 Base) MCG/ACT inhaler Inhale 1 puff into the lungs every 6 (six) hours as needed for wheezing or shortness of breath.  [name] amLODipine (NORVASC) 5 MG tablet Take 5 mg by  mouth daily.  [name] atorvastatin (LIPITOR) 20 MG tablet Take 20 mg by mouth daily.   . Cholecalciferol (VITAMIN D3) 1000 units CAPS Take by mouth.  . fluticasone (FLONASE) 50 MCG/ACT nasal spray Place into both nostrils daily.  [name] ipratropium-albuterol (DUONEB) 0.5-2.5 (3) MG/3ML SOLN Take 3 mLs by nebulization.  [name] loratadine (CLARITIN) 10 MG tablet Take 10 mg by mouth daily.  . metFORMIN (GLUCOPHAGE) 500 MG tablet Take 500 mg by mouth 2 (two) times daily with a meal.   . naproxen sodium (ALEVE) 220 MG tablet Take 220 mg by mouth at bedtime.  [name] omeprazole (PRILOSEC) 20 MG capsule Take 20 mg by mouth daily as needed.  . psyllium (METAMUCIL) 58.6 % powder Take 1 packet by mouth at bedtime. SUGAR FREE ORANGE FLAVORED  . vitamin B-12 (CYANOCOBALAMIN) 1000 MCG tablet Take 1,000 mcg by mouth daily.  [name] zolpidem (AMBIEN) 5 MG tablet Take 5 mg by mouth at bedtime.   . [DISCONTINUED] lisinopril (PRINIVIL,ZESTRIL) 30 MG tablet Take 30 mg by mouth daily.  (Patient not taking: Reported on  09/07/2020)   No facility-administered encounter medications on file as of 09/07/2020.     REVIEW OF SYSTEMS:  Gen: Denies fever, sweats or chills. No weight loss.  CV: Denies chest pain, palpitations or edema. Resp: Denies cough, shortness of breath of hemoptysis.  GI: Denies heartburn, dysphagia, stomach or lower abdominal pain. No diarrhea or constipation.  GU : Denies urinary burning, blood in urine, increased urinary frequency or incontinence. MS: Denies joint pain, muscles aches or weakness. Derm: Denies rash, itchiness, skin lesions or unhealing ulcers. Psych: Denies depression, anxiety, memory loss, suicidal ideation and confusion. Heme: Denies bruising, bleeding. Neuro:  Denies headaches, dizziness or paresthesias. Endo:  Denies any problems with DM, thyroid or adrenal function.   PHYSICAL EXAM: BP 124/68 (BP Location: Left Arm, Patient Position: Sitting, Cuff Size: Large)   Pulse 82   Ht 5' 10.5" (1.791 m)   Wt 293 lb 2 oz (133 kg)   BMI 41.46 kg/m   Wt Readings from Last 3 Encounters:  09/07/20 293 lb 2 oz (133 kg)  11/05/18 (!) 307 lb (139.3 kg)  09/15/18 297 lb (134.7 kg)   General: Obese 75 year old male in no acute distress. Head: Normocephalic and atraumatic. Eyes:  Sclerae non-icteric, conjunctive pink. Ears: Normal auditory acuity. Mouth: Dentition intact. No ulcers or lesions.  Neck: Supple, no lymphadenopathy or thyromegaly.  Lungs: Clear bilaterally to auscultation without wheezes, crackles or rhonchi. Heart: Regular rate and rhythm. No murmur, rub or gallop appreciated.  Abdomen: Soft, nontender, non distended. Large (size of a grapefruit) round protruding non reducible central abcominal ventral/umbilical hernia. Central lower abdominal scar intact. No hepatosplenomegaly assessed within obese abdomen. Normoactive bowel sounds x 4 quadrants.  Rectal: Deferred.  Musculoskeletal: Symmetrical with no gross deformities. Skin: Warm and dry. No rash or lesions on  visible extremities. Extremities: No edema. Neurological: Alert oriented x 4, no focal deficits.  Psychological:  Alert and cooperative. Normal mood and affect.  ASSESSMENT AND PLAN:  69. 75 year old male with a history of 2 incomplete colonoscopies secondary to a large ventral/umbilical hernia. One sessile adenomatous colon polyp was removed from the transverse colon at the time of his colon[facility] 08[facility]01/[facility]. Patient underwent evaluation by general surgery who recommended gastric bypass surgery for weight loss prior to pursuing abdominal hernia repair and a repeat colonoscopy to survey for colon polyps. He decided not to pursue gastric bypass surgery. He presents to  our office today to discuss having a repeat colonoscopy with Dr. Rush Landmark if appropriate.  -CTAP with oral and IV contrast to rule out any concerning colon mass and to assess the extent of his large central abdominal wall hernia -BMP prior to CTAP. Patient will need to hold Metformin day of and day after CT -Patient to follow up with Dr. Rush Landmark after CTAP completed  -Patient will most likely require umbilical/ventral hernia repair prior to pursing a repeat colonoscopy  -Request copy of most recent CBC and CMP from PCP's office   2. History of a 54mm sessile adenomatous colon polyp per colonoscopy 08/01/2017 -See plan in # 1  3. History of H. Pylori gastritis ( I cannot find which antibiotic regimen was prescribed in care everywhere). Post treatment H. Pylori stool antigen test 12/2017 was negative. Patient wishes to be have H. Pylori rechecked as he is concerned regarding his risk for stomach ulcers. -H. Pylori stool antigen   4. DM II  5. History of prostate cancer s/p prostatectomy 2007     CC:  Rosita Fire, MD

## 2020-09-08 DIAGNOSIS — Z8619 Personal history of other infectious and parasitic diseases: Secondary | ICD-10-CM | POA: Diagnosis not present

## 2020-09-09 LAB — HELICOBACTER PYLORI  SPECIAL ANTIGEN
MICRO NUMBER:: 11603638
SPECIMEN QUALITY: ADEQUATE

## 2020-09-09 NOTE — Progress Notes (Signed)
Further recommendations to be verified after CTAP results received.

## 2020-09-09 NOTE — Progress Notes (Signed)
Attending Physician's Attestation   I have reviewed the chart.   I agree with the Advanced Practitioner's note, impression, and recommendations with any updates as below. The patient has had 2 failed colonoscopies and the one performed by [facility] advanced gastroenterology service, Dr. Isidor Holts.  I think repeating colonoscopy likely will have similar outcome as he has not had his ventral hernia repaired.  Certainly cross-sectional imaging can be performed to see if there is any abnormality but I suspect he likely will need a CT colonography after CT scan has been completed.  Happy to talk with the patient is very adamant about trialing 1 more colonoscopy attempt we can certainly with a history of adenomatous colon polyps that is reasonable but we want to be honest and that the likelihood of passage of this area especially when he is already had an advanced endoscopist attempt this will be very unlikely.  We are happy to see the patient in follow-up and discuss things further after his initial cross-sectional imaging is done.   Justice Britain, MD [facility] Advanced Endoscopy Office # [phone]

## 2020-09-12 DIAGNOSIS — E119 Type 2 diabetes mellitus without complications: Secondary | ICD-10-CM | POA: Diagnosis not present

## 2020-09-12 DIAGNOSIS — I10 Essential (primary) hypertension: Secondary | ICD-10-CM | POA: Diagnosis not present

## 2020-09-13 DIAGNOSIS — M1611 Unilateral primary osteoarthritis, right hip: Secondary | ICD-10-CM | POA: Diagnosis not present

## 2020-09-13 DIAGNOSIS — M1712 Unilateral primary osteoarthritis, left knee: Secondary | ICD-10-CM | POA: Diagnosis not present

## 2020-09-13 DIAGNOSIS — M1711 Unilateral primary osteoarthritis, right knee: Secondary | ICD-10-CM | POA: Diagnosis not present

## 2020-09-15 ENCOUNTER — Encounter (HOSPITAL_COMMUNITY): Payer: Self-pay

## 2020-09-15 ENCOUNTER — Other Ambulatory Visit: Payer: Self-pay

## 2020-09-15 ENCOUNTER — Ambulatory Visit (HOSPITAL_COMMUNITY)
Admission: RE | Admit: 2020-09-15 | Discharge: 2020-09-15 | Disposition: A | Discharge status: Home / Self Care | Payer: Medicare Other | Source: Ambulatory Visit | Attending: Nurse Practitioner | Admitting: Nurse Practitioner

## 2020-09-15 DIAGNOSIS — Z8619 Personal history of other infectious and parasitic diseases: Secondary | ICD-10-CM | POA: Diagnosis not present

## 2020-09-15 DIAGNOSIS — R109 Unspecified abdominal pain: Secondary | ICD-10-CM | POA: Diagnosis not present

## 2020-09-15 DIAGNOSIS — Z86010 Personal history of colonic polyps: Secondary | ICD-10-CM | POA: Insufficient documentation

## 2020-09-15 DIAGNOSIS — K429 Umbilical hernia without obstruction or gangrene: Secondary | ICD-10-CM | POA: Insufficient documentation

## 2020-09-15 MED ORDER — IOHEXOL 300 MG/ML  SOLN
100.0000 mL | Freq: Once | INTRAMUSCULAR | Status: AC | PRN
  Administered 2020-09-15: 100 mL via INTRAVENOUS

## 2020-10-10 ENCOUNTER — Other Ambulatory Visit: Payer: Self-pay

## 2020-10-10 DIAGNOSIS — Z86010 Personal history of colonic polyps: Secondary | ICD-10-CM

## 2020-10-10 DIAGNOSIS — Z1211 Encounter for screening for malignant neoplasm of colon: Secondary | ICD-10-CM

## 2020-10-13 DIAGNOSIS — E1165 Type 2 diabetes mellitus with hyperglycemia: Secondary | ICD-10-CM | POA: Diagnosis not present

## 2020-10-13 DIAGNOSIS — I10 Essential (primary) hypertension: Secondary | ICD-10-CM | POA: Diagnosis not present

## 2020-10-28 ENCOUNTER — Telehealth: Payer: Self-pay

## 2020-10-28 NOTE — Telephone Encounter (Signed)
Appointment with [facility] Surgery 11/10/20.

## 2020-11-07 ENCOUNTER — Ambulatory Visit
Admission: RE | Admit: 2020-11-07 | Discharge: 2020-11-07 | Disposition: A | Discharge status: Home / Self Care | Payer: Medicare Other | Source: Ambulatory Visit | Attending: Nurse Practitioner | Admitting: Nurse Practitioner

## 2020-11-07 DIAGNOSIS — J9811 Atelectasis: Secondary | ICD-10-CM | POA: Diagnosis not present

## 2020-11-07 DIAGNOSIS — Z1211 Encounter for screening for malignant neoplasm of colon: Secondary | ICD-10-CM

## 2020-11-07 DIAGNOSIS — K469 Unspecified abdominal hernia without obstruction or gangrene: Secondary | ICD-10-CM | POA: Diagnosis not present

## 2020-11-07 DIAGNOSIS — Q438 Other specified congenital malformations of intestine: Secondary | ICD-10-CM | POA: Diagnosis not present

## 2020-11-07 DIAGNOSIS — K635 Polyp of colon: Secondary | ICD-10-CM | POA: Diagnosis not present

## 2020-11-07 DIAGNOSIS — Z86010 Personal history of colonic polyps: Secondary | ICD-10-CM

## 2020-11-10 DIAGNOSIS — E1169 Type 2 diabetes mellitus with other specified complication: Secondary | ICD-10-CM | POA: Diagnosis not present

## 2020-11-10 DIAGNOSIS — M170 Bilateral primary osteoarthritis of knee: Secondary | ICD-10-CM | POA: Diagnosis not present

## 2020-11-10 DIAGNOSIS — K432 Incisional hernia without obstruction or gangrene: Secondary | ICD-10-CM | POA: Diagnosis not present

## 2020-11-10 DIAGNOSIS — E669 Obesity, unspecified: Secondary | ICD-10-CM | POA: Diagnosis not present

## 2020-11-10 DIAGNOSIS — I10 Essential (primary) hypertension: Secondary | ICD-10-CM | POA: Diagnosis not present

## 2020-11-10 DIAGNOSIS — M869 Osteomyelitis, unspecified: Secondary | ICD-10-CM | POA: Diagnosis not present

## 2020-11-12 DIAGNOSIS — I10 Essential (primary) hypertension: Secondary | ICD-10-CM | POA: Diagnosis not present

## 2020-11-12 DIAGNOSIS — E119 Type 2 diabetes mellitus without complications: Secondary | ICD-10-CM | POA: Diagnosis not present

## 2020-11-17 ENCOUNTER — Ambulatory Visit (INDEPENDENT_AMBULATORY_CARE_PROVIDER_SITE_OTHER): Payer: Medicare Other | Admitting: Gastroenterology

## 2020-11-17 ENCOUNTER — Encounter: Payer: Self-pay | Admitting: Gastroenterology

## 2020-11-17 ENCOUNTER — Other Ambulatory Visit: Payer: Self-pay

## 2020-11-17 VITALS — BP 122/72 | HR 80 | Ht 70.5 in | Wt 290.2 lb

## 2020-11-17 DIAGNOSIS — K458 Other specified abdominal hernia without obstruction or gangrene: Secondary | ICD-10-CM | POA: Diagnosis not present

## 2020-11-17 DIAGNOSIS — R935 Abnormal findings on diagnostic imaging of other abdominal regions, including retroperitoneum: Secondary | ICD-10-CM | POA: Diagnosis not present

## 2020-11-17 DIAGNOSIS — Z1211 Encounter for screening for malignant neoplasm of colon: Secondary | ICD-10-CM

## 2020-11-17 DIAGNOSIS — Z6841 Body Mass Index (BMI) 40.0 and over, adult: Secondary | ICD-10-CM

## 2020-11-17 DIAGNOSIS — E669 Obesity, unspecified: Secondary | ICD-10-CM

## 2020-11-17 NOTE — Patient Instructions (Signed)
Follow  Up in 6 months or sooner if needed.   If you are age 73 or older, your body mass index should be between 23-30. Your Body mass index is 41.06 kg/m. If this is out of the aforementioned range listed, please consider follow up with your Primary Care Provider.  If you are age 44 or younger, your body mass index should be between 19-25. Your Body mass index is 41.06 kg/m. If this is out of the aformentioned range listed, please consider follow up with your Primary Care Provider.    Thank you for choosing me and [facility].  Dr. Rush Landmark

## 2020-11-22 ENCOUNTER — Encounter: Payer: Self-pay | Admitting: Gastroenterology

## 2020-11-22 DIAGNOSIS — R935 Abnormal findings on diagnostic imaging of other abdominal regions, including retroperitoneum: Secondary | ICD-10-CM | POA: Insufficient documentation

## 2020-11-22 DIAGNOSIS — Z1211 Encounter for screening for malignant neoplasm of colon: Secondary | ICD-10-CM | POA: Insufficient documentation

## 2020-11-22 DIAGNOSIS — K458 Other specified abdominal hernia without obstruction or gangrene: Secondary | ICD-10-CM | POA: Insufficient documentation

## 2020-11-22 NOTE — Progress Notes (Signed)
[place] VISIT   Primary Care Provider Rosita Fire, MD [place] [address] [phone]  Patient Profile: Travis Blanchard is a 75 y.o. male with a pmh significant for COPD, hypertension, hyperlipidemia, diabetes, morbid obesity, B12 deficiency, prostate cancer, abdominal hernia.  The patient presents to the [facility] for an evaluation and management of problem(s) noted below:  Problem List 1. Other specified abdominal hernia without obstruction or gangrene   2. Colon cancer screening   3. Abnormal CT of the abdomen   4. Class 3 severe obesity with body mass index (BMI) of 40.0 to 44.9 in adult, unspecified obesity type, unspecified whether serious comorbidity present [facility])     History of Present Illness Please see initial consultation note and prior progress notes by NP [facility] for full details of HPI.  Interval History The patient presents for scheduled follow-up.  Patient has undergone a CT colonography with results not suggesting an overt mass or lesion however the right side of the colon and transverse colon may have decreased sensitivity as result of the inability to do significant gas pressure to minimize the risk of complication in the setting of his large abdominal hernia.  No large mass or lesion was noted on the previous CT abdomen/pelvis either.  Patient is having bowel movements on a daily basis.  No blood in his stools.  No abdominal pain.  He has seen [facility], Dr. Lanny Hurst for consideration of abdominal surgery to see about fixing his hernia.  Patient is scheduled to see nutrition and continue to work with them and surgery prior to potential intervention.  He has approximately 5 to 6 months to see how he does before surgery is considered.  Patient has already begun to lose some weight.  The patient has no other significant GI issues at this time.  He is interested in wanting to have a complete  colonoscopy though still.  GI Review of Systems Positive as above Negative for pyrosis, dysphagia, odynophagia, nausea, vomiting, pain, change in bowel habits  Review of Systems General: Denies fevers/chills/weight loss unintentionally Cardiovascular: Denies chest pain/palpitations Pulmonary: Denies shortness of breath/nocturnal cough Gastroenterological: See HPI Genitourinary: Denies darkened urine or hematuria Hematological: Denies easy bruising/bleeding Endocrine: Denies temperature intolerance Dermatological: Denies jaundice Psychological: Mood is stable   Medications Current Outpatient Medications  Medication Sig Dispense Refill  . acetaminophen (TYLENOL) 500 MG tablet Take 1,000 mg by mouth every 6 (six) hours as needed.    [name] albuterol (VENTOLIN HFA) 108 (90 Base) MCG/ACT inhaler Inhale 1 puff into the lungs every 6 (six) hours as needed for wheezing or shortness of breath. 1 Inhaler 0  . amLODipine (NORVASC) 5 MG tablet Take 5 mg by mouth daily.    [name] atorvastatin (LIPITOR) 20 MG tablet Take 20 mg by mouth daily.     . Cholecalciferol (VITAMIN D3) 1000 units CAPS Take by mouth.    . fluticasone (FLONASE) 50 MCG/ACT nasal spray Place into both nostrils daily.    [name] ipratropium-albuterol (DUONEB) 0.5-2.5 (3) MG/3ML SOLN Take 3 mLs by nebulization.    [name] loratadine (CLARITIN) 10 MG tablet Take 10 mg by mouth daily.    . metFORMIN (GLUCOPHAGE) 500 MG tablet Take 500 mg by mouth 2 (two) times daily with a meal.     . naproxen sodium (ALEVE) 220 MG tablet Take 220 mg by mouth at bedtime.    [name] omeprazole (PRILOSEC) 20 MG capsule Take 20 mg by mouth daily as needed.    [name]  psyllium (METAMUCIL) 58.6 % powder Take 1 packet by mouth at bedtime. SUGAR FREE ORANGE FLAVORED    . vitamin B-12 (CYANOCOBALAMIN) 1000 MCG tablet Take 1,000 mcg by mouth daily.    [name] zolpidem (AMBIEN) 10 MG tablet Take 10 mg by mouth at bedtime.     No current facility-administered medications for this visit.     Allergies Allergies  Allergen Reactions  . Lisinopril Swelling    Facial swelling  . Penicillins Hives, Itching, Other (See Comments), Anaphylaxis and Shortness Of Breath    Has patient had a PCN reaction causing immediate rash, facial/tongue/throat swelling, SOB or lightheadedness with hypotension: Yes Has patient had a PCN reaction causing severe rash involving mucus membranes or skin necrosis: No Has patient had a PCN reaction that required hospitalization: No Has patient had a PCN reaction occurring within the last 10 years: No If all of the above answers are "NO", then may proceed with Cephalosporin use.  Other reaction(s): Other (See Comments) Has patient had a PCN reaction causing immediate rash, facial/tongue/throat swelling, SOB or lightheadedness with hypotension: Yes Has patient had a PCN reaction causing severe rash involving mucus membranes or skin necrosis: No Has patient had a PCN reaction that required hospitalization: No Has patient had a PCN reaction occurring within the last 10 years: No If all of the above answers are "NO", then may proceed with Cephalosporin use.     Histories Past Medical History:  Diagnosis Date  . Arthritis   . Asthma   . B12 deficiency   . Cancer [facility])    Prostate  . COPD (chronic obstructive pulmonary disease) ([place])   . Cough   . Decreased hearing   . Diabetes mellitus without complication ([place])   . Floaters in visual field   . Frequent urination   . Hoarseness   . Hyperlipidemia   . Hypertension   . Joint pain   . Lower back pain   . Muscle pain   . Muscle stiffness   . Neck stiffness   . Right hip pain   . Right knee pain   . Shortness of breath    with sudden awakening from sleep  . Tinnitus   . Trouble in sleeping   . Umbilical hernia   . Vitamin D deficiency   . Wheezing    Past Surgical History:  Procedure Laterality Date  . COLONOSCOPY  2008   Dr. Gala Romney: 2 diminutive polyps in rectum (hyperplastic),  otherwise normal  . COLONOSCOPY  2000   Per notes in epic, history of adenomas at outside facility  . COLONOSCOPY WITH PROPOFOL N/A 07/18/2017   Procedure: COLONOSCOPY WITH PROPOFOL;  Surgeon: Daneil Dolin, MD;  Location: [facility];  Service: Endoscopy;  Laterality: N/A;  8:45  . HEMORRHOID SURGERY    . KNEE ARTHROSCOPY     Left  . KNEE ARTHROSCOPY     right  . PROSTATE SURGERY  2000   Prostate removal/Cancer   Social History   Socioeconomic History  . Marital status: Married    Spouse name: Merick Kelleher  . Number of children: Not on file  . Years of education: Not on file  . Highest education level: Not on file  Occupational History  . Occupation: Bailbondsman  Tobacco Use  . Smoking status: Former Smoker    Types: Cigarettes    Quit date: 03/11/2002    Years since quitting: 18.7  . Smokeless tobacco: Never Used  Vaping Use  . Vaping Use: Never used  Substance and Sexual Activity  . Alcohol use: Yes    Comment: 6 pack of beer per month  . Drug use: No  . Sexual activity: Not on file  Other Topics Concern  . Not on file  Social History Narrative  . Not on file   Social Determinants of Health   Financial Resource Strain: Not on file  Food Insecurity: Not on file  Transportation Needs: Not on file  Physical Activity: Not on file  Stress: Not on file  Social Connections: Not on file  Intimate Partner Violence: Not on file   Family History  Problem Relation Age of Onset  . Diabetes Mother   . Hypertension Mother   . Hyperlipidemia Mother   . Stroke Mother   . Heart disease Mother   . Depression Mother   . Obesity Mother   . Eating disorder Mother   . Cancer Father   . Depression Father   . Anxiety disorder Father   . Liver disease Father   . Alcoholism Father   . Pancreatic cancer Father   . Cancer Sister        Breast  . Diabetes Sister   . Colon cancer Neg Hx   . Esophageal cancer Neg Hx   . Inflammatory bowel disease Neg Hx   . Rectal  cancer Neg Hx   . Stomach cancer Neg Hx    I have reviewed his medical, social, and family history in detail and updated the electronic medical record as necessary.    PHYSICAL EXAMINATION  BP 122/72 (BP Location: Left Arm, Patient Position: Sitting, Cuff Size: Large)   Pulse 80   Ht 5' 10.5" (1.791 m)   Wt 290 lb 4 oz (131.7 kg)   BMI 41.06 kg/m  Wt Readings from Last 3 Encounters:  11/17/20 290 lb 4 oz (131.7 kg)  09/07/20 293 lb 2 oz (133 kg)  11/05/18 (!) 307 lb (139.3 kg)  GEN: NAD, appears stated age, doesn't appear chronically ill PSYCH: Cooperative, without pressured speech EYE: Conjunctivae pink, sclerae anicteric ENT: Masked CV: Nontachycardic RESP: No audible wheezing GI: NABS, protuberant abdomen, ventral hernia noted, rounded, no rebound MSK/EXT: Lower extremity edema present bilaterally SKIN: No jaundice NEURO:  Alert & Oriented x 3, no focal deficits   REVIEW OF DATA  I reviewed the following data at the time of this encounter:  GI Procedures and Studies  See prior notation in chart by NP [facility]  Laboratory Studies  Reviewed those in epic  Imaging Studies  May 2022 virtual colonoscopy IMPRESSION: Mildly limited evaluation. No significant colonic polyp, mass, apple core lesion, or stricture. Large left periumbilical hernia containing a loop of transverse colon, without obstruction or inflammatory changes. Please note that the large hernia makes the procedure technically difficult and (similar to colonoscopy) raises the likelihood of adverse complication due to insufflation. Status post prostatectomy.  March 2022 CT abdomen pelvis with contrast IMPRESSION: 1. 12.8 x 9.7 x 11.9 cm left paraumbilical hernia contains a segment of transverse colon. No evidence for colonic obstruction. No edema or fluid in the hernia sac. There is some subtle wall thickening in the colon as it re-enters the peritoneal cavity, potentially from underdistention. 2.  Status post prostatectomy. 3. Aortic Atherosclerosis (ICD10-I70.0).   ASSESSMENT  Mr. Guertin is a 75 y.o. male with a pmh significant for COPD, hypertension, hyperlipidemia, diabetes, morbid obesity, B12 deficiency, prostate cancer, abdominal hernia.  The patient is seen today for evaluation and management of:  1. Other specified  abdominal hernia without obstruction or gangrene   2. Colon cancer screening   3. Abnormal CT of the abdomen   4. Class 3 severe obesity with body mass index (BMI) of 40.0 to 44.9 in adult, unspecified obesity type, unspecified whether serious comorbidity present [facility])    The patient is hemodynamically and clinically stable at this time.  Based on the recent cross-sectional imaging, there is no evidence of a mass or lesion but the possibility of missing something as result of the patient's significant hernia not allowing complete distensibility during virtual colonoscopy remains.  I think it is reasonable for the patient to undergo surgical intervention, if he is deemed a candidate for it.  This will then allow [name] time to have an attempt at completion colonoscopy.  I am okay if this occurs within the next 6 to 12 months since we have a virtual colonoscopy that he is stating large lesions or not being found.  If the patient develops significant alteration in bowel habits, bleeding per rectum, or significant abdominal pains then we may need to consider endoscopic evaluation sooner.  We will plan to see the patient back in 6 months unless other things develop.  All patient questions were answered to the best of my ability, and the patient agrees to the aforementioned plan of action with follow-up as indicated.   PLAN  Follow-up planned in 6 months Plan to proceed with repeat attempt at colonoscopy in hospital-based setting in the future (after he has had a surgical intervention to his large abdominal hernia) Patient to alert [name] if other issues develop in the interim  No  orders of the defined types were placed in this encounter.   New Prescriptions   No medications on file   Modified Medications   No medications on file    Planned Follow Up Return in about 6 months (around 05/20/2021).   Total Time in Face-to-Face and in Coordination of Care for patient including independent/personal interpretation/review of prior testing, medical history, examination, medication adjustment, communicating results with the patient directly, and documentation with the EHR is 25 minutes.  Justice Britain, MD [facility] # [phone]

## 2020-12-03 DIAGNOSIS — Z23 Encounter for immunization: Secondary | ICD-10-CM | POA: Diagnosis not present

## 2020-12-13 DIAGNOSIS — E119 Type 2 diabetes mellitus without complications: Secondary | ICD-10-CM | POA: Diagnosis not present

## 2020-12-13 DIAGNOSIS — I10 Essential (primary) hypertension: Secondary | ICD-10-CM | POA: Diagnosis not present

## 2020-12-22 ENCOUNTER — Encounter: Payer: Medicare Other | Attending: General Surgery | Admitting: Skilled Nursing Facility1

## 2020-12-22 ENCOUNTER — Encounter: Payer: Self-pay | Admitting: Skilled Nursing Facility1

## 2020-12-22 ENCOUNTER — Other Ambulatory Visit: Payer: Self-pay

## 2020-12-22 DIAGNOSIS — E1140 Type 2 diabetes mellitus with diabetic neuropathy, unspecified: Secondary | ICD-10-CM | POA: Diagnosis not present

## 2020-12-22 DIAGNOSIS — Z6841 Body Mass Index (BMI) 40.0 and over, adult: Secondary | ICD-10-CM | POA: Insufficient documentation

## 2020-12-22 NOTE — Progress Notes (Signed)
Nutrition Assessment for Bariatric Surgery Medical Nutrition Therapy  Patient was seen on 12/22/2020 for Pre-Operative Nutrition Assessment. Letter of approval faxed to [facility] bariatric surgery program coordinator on 12/22/2020.   Referral stated Supervised Weight Loss (SWL) visits needed: 6  Planned surgery: sleeve gastrectomy  Pt expectation of surgery: to get a hernia repair, hip replacement, knee replacement  Pt expectation of dietitian: none identified    NUTRITION ASSESSMENT   Anthropometrics  Start weight at [facility]: 288 lbs (date: 12/22/2020)  Height: 70.5 in BMI: 40.74 kg/m2     Clinical  Medical hx: prostate cancer, diabetes, HTN, hyperlipidemia, COPD, gastric ulcer Medications: amlodipine, omperezole, b12, lipitor, metformin   Labs: 2 years ago 6.6 Notable signs/symptoms: none identified  Any previous deficiencies? Yes, vitmain B12  Micronutrient Nutrition Focused Physical Exam: Hair: No issues observed Eyes: No issues observed Mouth: No issues observed Neck: No issues observed Nails: No issues observed Skin: No issues observed  Lifestyle & Dietary Hx  Pt states he checks his blood sugar daily at night before bed:  95-125 Pt states he does not wake until about 11:30 and eats at that time. Pt states he goes to bed about 3-4am. (Pt states he is not willing to change his sleep routine at this time) Pt states he recognizes he needs smaller portions and less peanuts but states that is difficult and does not have much else to look forward too.  Pt states he smokes weed nightly for pain management.   24-Hr Dietary Recall First Meal: skipped Snack: fruit Second Meal: 2 oatmeal + fruit Snack: fruit Third Meal 6:30-7:30: broccoli + cauliflier + red onion + celery + cucumber + chicken + sweet potato + boars head [place] Snack: boars head [place] half sandwich + apple + nuts Beverages: water, half and half tea, lemon water, soda, black coffee   Estimated  Energy Needs Calories: 1800   NUTRITION DIAGNOSIS  Overweight/obesity ([place]-3.3) related to past poor dietary habits and physical inactivity as evidenced by patient w/ planned sleeve gastrectomy surgery following dietary guidelines for continued weight loss.    NUTRITION INTERVENTION  Nutrition counseling (C-1) and education (E-2) to facilitate bariatric surgery goals.  Educated pt on micronutrient deficiencies post surgery and strategies to mitigate that risk   Pre-Op Goals Reviewed with the Patient Track food and beverage intake (pen and paper, MyFitness Pal, Baritastic app, etc.) Make healthy food choices while monitoring portion sizes: cut portions in half including meats  Consume 3 meals per day or try to eat every 3-5 hours Avoid concentrated sugars and fried foods Keep sugar & fat in the single digits per serving on food labels Practice CHEWING your food (aim for applesauce consistency) Practice not drinking 15 minutes before, during, and 30 minutes after each meal and snack Avoid all carbonated beverages (ex: soda, sparkling beverages)  Limit caffeinated beverages (ex: coffee, tea, energy drinks) Avoid all sugar-sweetened beverages (ex: regular soda, sports drinks)  Avoid alcohol  Aim for 64-100 ounces of FLUID daily (with at least half of fluid intake being plain water)  Aim for at least 60-80 grams of PROTEIN daily Look for a liquid protein source that contains ?15 g protein and ?5 g carbohydrate (ex: shakes, drinks, shots) Make a list of non-food related activities Physical activity is an important part of a healthy lifestyle so keep it moving! The goal is to reach 150 minutes of exercise per week, including cardiovascular and weight baring activity.  *Goals that are bolded indicate the pt would  like to start working towards these  Handouts Provided Include  Bariatric Surgery handouts (Nutrition Visits, Pre-Op Goals, Protein Shakes, Vitamins & Minerals)  Learning Style &  Readiness for Change Teaching method utilized: Visual & Auditory  Demonstrated degree of understanding via: Teach Back  Readiness Level: pre-contemplative  Barriers to learning/adherence to lifestyle change: obstinate to change   RD's Notes for Next Visit Assess pts adherence to chosen goals      MONITORING & EVALUATION Dietary intake, weekly physical activity, body weight, and pre-op goals reached at next nutrition visit.    Next Steps  Patient is to follow up at [facility] for Pre-Op Class >2 weeks before surgery for further nutrition education.  Follow up for next SWL

## 2021-01-17 ENCOUNTER — Encounter: Payer: Self-pay | Admitting: Gastroenterology

## 2021-01-17 NOTE — Progress Notes (Signed)
Recall has been entered  

## 2021-01-17 NOTE — Progress Notes (Signed)
Review of outpatient [facility] note from Dr. Redmond Pulling  Patient will undergo weight loss with subsequent weight loss surgery and if he does well with that then hernia repair.  Patient will be working with bariatric service to try and optimize him.  Since the patient has had a virtual colonoscopy as well as prior CT scans that showed no evidence of a large mass or lesion, I think it is reasonable for the patient to undergo weight loss, weight loss surgery, subsequent ventral hernia repair before we attempt colonoscopy since he is already had failed attempts previously.  I will place a recall into the system for a 1 year follow-up in clinic or sooner if needed.  I will have this office note scanned into the chart.  Justice Britain, MD [facility] Office # [phone]

## 2021-01-23 DIAGNOSIS — I10 Essential (primary) hypertension: Secondary | ICD-10-CM | POA: Diagnosis not present

## 2021-01-23 DIAGNOSIS — E785 Hyperlipidemia, unspecified: Secondary | ICD-10-CM | POA: Diagnosis not present

## 2021-01-23 DIAGNOSIS — E1165 Type 2 diabetes mellitus with hyperglycemia: Secondary | ICD-10-CM | POA: Diagnosis not present

## 2021-01-23 DIAGNOSIS — E119 Type 2 diabetes mellitus without complications: Secondary | ICD-10-CM | POA: Diagnosis not present

## 2021-01-23 DIAGNOSIS — R600 Localized edema: Secondary | ICD-10-CM | POA: Diagnosis not present

## 2021-01-24 ENCOUNTER — Encounter: Payer: Medicare Other | Attending: General Surgery | Admitting: Skilled Nursing Facility1

## 2021-01-24 ENCOUNTER — Other Ambulatory Visit: Payer: Self-pay

## 2021-01-24 DIAGNOSIS — Z6841 Body Mass Index (BMI) 40.0 and over, adult: Secondary | ICD-10-CM | POA: Insufficient documentation

## 2021-01-24 DIAGNOSIS — E1140 Type 2 diabetes mellitus with diabetic neuropathy, unspecified: Secondary | ICD-10-CM | POA: Diagnosis not present

## 2021-01-24 NOTE — Progress Notes (Signed)
Supervised Weight Loss Visit Bariatric Nutrition Education  Planned Surgery: sleeve  Pt Expectation of Surgery/ Goals: hip and knee surgery  1 out of 6 SWL Appointments    NUTRITION ASSESSMENT  Anthropometrics  Start weight at [facility]: 288 lbs (date: 12/22/2020)  Weight: 289.9 pounds Height: 70.5 in BMI: 41.01 kg/m2     Clinical  Medical hx: prostate cancer, diabetes, HTN, hyperlipidemia, COPD, gastric ulcer Medications: amlodipine, omperezole, b12, lipitor, metformin   Labs: 2 years ago 6.6 Notable signs/symptoms: none identified  Any previous deficiencies? Yes, vitmain B12  Lifestyle & Dietary Hx  Pt states he gets himself in the most trouble staying up so late and eating.  Pt states he is not very active due to his hip and knee pain.   Estimated daily fluid intake: unknown oz Supplements:  Current average weekly physical activity:   24-Hr Dietary Recall First Meal: 1 pack oatmeal + berries  Snack: apple Second Meal: chicken wings BBQ sauce and salad Snack: nectarines and peach Third Meal: 8-9 ounces salmon + caluflower + broccoli + celery + onion + bell pepper + low sodium dressing Snack:  Beverages: water, beer, half and half tea  Estimated Energy Needs Calories: 1800   NUTRITION DIAGNOSIS  Overweight/obesity ([place]-3.3) related to past poor dietary habits and physical inactivity as evidenced by patient w/ planned sleeve gastrectomy surgery following dietary guidelines for continued weight loss.   NUTRITION INTERVENTION  Nutrition counseling (C-1) and education (E-2) to facilitate bariatric surgery goals.  Pre-Op Goals Progress & New Goals Continue: Cut all portions in half; use a smaller dish: unsuccessful with this NEW: limit to 3 servings of fruit per day NEW: try to get to bed a little earlier    Handouts Provided Include    Learning Style & Readiness for Change Teaching method utilized: Visual & Auditory  Demonstrated degree of understanding via:  Teach Back  Readiness Level: contemplative  Barriers to learning/adherence to lifestyle change: difficulty of change  RD's Notes for next Visit  Assess pts adherence to chosen goals    MONITORING & EVALUATION Dietary intake, weekly physical activity, body weight, and pre-op goals in 1 month.   Next Steps  Patient is to return to [facility] for next SWL

## 2021-02-23 DIAGNOSIS — E119 Type 2 diabetes mellitus without complications: Secondary | ICD-10-CM | POA: Diagnosis not present

## 2021-02-23 DIAGNOSIS — I10 Essential (primary) hypertension: Secondary | ICD-10-CM | POA: Diagnosis not present

## 2021-03-02 ENCOUNTER — Encounter: Payer: Medicare Other | Attending: General Surgery | Admitting: Skilled Nursing Facility1

## 2021-03-02 ENCOUNTER — Other Ambulatory Visit: Payer: Self-pay

## 2021-03-02 DIAGNOSIS — Z6841 Body Mass Index (BMI) 40.0 and over, adult: Secondary | ICD-10-CM | POA: Diagnosis not present

## 2021-03-02 DIAGNOSIS — E1140 Type 2 diabetes mellitus with diabetic neuropathy, unspecified: Secondary | ICD-10-CM | POA: Diagnosis not present

## 2021-03-02 NOTE — Progress Notes (Signed)
Supervised Weight Loss Visit Bariatric Nutrition Education  Planned Surgery: sleeve  Pt Expectation of Surgery/ Goals: hip and knee surgery  2 out of 6 SWL Appointments    NUTRITION ASSESSMENT  Anthropometrics  Start weight at [facility]: 288 lbs (date: 12/22/2020)  Weight: 289.9 pounds Height: 70.5 in BMI: 41.01 kg/m2     Clinical  Medical hx: prostate cancer, diabetes, HTN, hyperlipidemia, COPD, gastric ulcer Medications: amlodipine, omperezole, b12, lipitor, metformin   Labs: 2 years ago 6.6 Notable signs/symptoms: none identified  Any previous deficiencies? Yes, vitmain B12  Lifestyle & Dietary Hx  Pt states he gets himself in the most trouble staying up so late and eating.  Pt states he is not very active due to his hip and knee pain.   Pt states he weighs himself every day to every other day without issue stating it helps him monitor what he should be eating.  Pt states he realized he was eating too many nuts. Pt states he has been able to tolerate his hip pain better and able to walk more and be more active which is his goal. Pt states he will be going on a jazz cruise in January which he is excited about. Pt states he experimented with braces on each knee which he felt helped. Pt states he wants his blood pressure up a bit stating he feels it is low at about 100 wanting it to be 115.  Pt states he got a lift for his Tammi Klippel which will help his hip.  Pt states since making these changes his wife is more stable and not falling as often.   Estimated daily fluid intake: unknown oz Supplements:  Current average weekly physical activity:   24-Hr Dietary Recall First Meal: 1 pack oatmeal + berries  Snack: apple Second Meal: chicken wings BBQ sauce and salad or [place] restaurant Snack: nectarines and peach or peach + half sandwich Third Meal: 8-9 ounces salmon + caluflower + broccoli + celery + onion + bell pepper + low sodium dressing Snack:  Beverages: water, beer, half and  half tea, sometimes frozen margarita   Estimated Energy Needs Calories: 1800   NUTRITION DIAGNOSIS  Overweight/obesity ([place]-3.3) related to past poor dietary habits and physical inactivity as evidenced by patient w/ planned sleeve gastrectomy surgery following dietary guidelines for continued weight loss.   NUTRITION INTERVENTION  Nutrition counseling (C-1) and education (E-2) to facilitate bariatric surgery goals.  Pre-Op Goals Progress & New Goals Continue: Cut all portions in half; use a smaller dish: unsuccessful with this Continue: limit to 3 servings of fruit per day Continue: try to get to bed a little earlier; unsuccessful with this NEW: take sleep medication sooner in the evening to get to bed sooner and limit late night eating NEW: increase activity generally  NEW: get in the jacuzzi around 9:30-10pm   Handouts Provided Include    Learning Style & Readiness for Change Teaching method utilized: Visual & Auditory  Demonstrated degree of understanding via: Teach Back  Readiness Level: contemplative  Barriers to learning/adherence to lifestyle change: difficulty of change  RD's Notes for next Visit  Assess pts adherence to chosen goals    MONITORING & EVALUATION Dietary intake, weekly physical activity, body weight, and pre-op goals in 1 month.   Next Steps  Patient is to return to [facility] for next SWL

## 2021-03-27 DIAGNOSIS — E119 Type 2 diabetes mellitus without complications: Secondary | ICD-10-CM | POA: Diagnosis not present

## 2021-03-27 DIAGNOSIS — I10 Essential (primary) hypertension: Secondary | ICD-10-CM | POA: Diagnosis not present

## 2021-04-05 ENCOUNTER — Encounter: Payer: Medicare Other | Attending: General Surgery | Admitting: Skilled Nursing Facility1

## 2021-04-05 ENCOUNTER — Other Ambulatory Visit: Payer: Self-pay

## 2021-04-05 DIAGNOSIS — E1140 Type 2 diabetes mellitus with diabetic neuropathy, unspecified: Secondary | ICD-10-CM

## 2021-04-05 DIAGNOSIS — Z6841 Body Mass Index (BMI) 40.0 and over, adult: Secondary | ICD-10-CM | POA: Diagnosis not present

## 2021-04-05 NOTE — Progress Notes (Signed)
Supervised Weight Loss Visit Bariatric Nutrition Education  Planned Surgery: sleeve  Pt Expectation of Surgery/ Goals: hip and knee surgery  3 out of 6 SWL Appointments    NUTRITION ASSESSMENT  Anthropometrics  Start weight at [facility]: 288 lbs (date: 12/22/2020)  Weight: 285.6 pounds Height: 70.5 in BMI: 40.40 kg/m2     Clinical  Medical hx: prostate cancer, diabetes, HTN, hyperlipidemia, COPD, gastric ulcer Medications: amlodipine, omperezole, b12, lipitor, metformin   Labs: 2 years ago 6.6 Notable signs/symptoms: none identified  Any previous deficiencies? Yes, vitmain B12  Lifestyle & Dietary Hx  Pt states he gets himself in the most trouble staying up so late and eating: pt has been making great changes here.    Pt states he weighs himself every day to every other day without issue stating it helps him monitor what he should be eating. Pt states he has been able to tolerate his hip pain better and able to walk more and be more active which is his goal, and now has been turning over in his bed easier which has been exciting.   Pt states he was able to cut back on peanuts and changed when he eats fruit and substitutes fruit for desserts. Pt states he does not struggle as much to turr over in the bed. Pt states he has not gotten the lift for his Tammi Klippel yet stating this cool weather has brought on more aches in his hip. Pt states his son has been struggling with his weight so he will be having gastric bypass as well in the next month. Pt states his blood pressure has been stable still. Pt states buying new clothes and excess skin after surgery may be a concern for him.  Pt state she is on a high fiber diet   Estimated daily fluid intake: unknown oz Supplements:  Current average weekly physical activity:   24-Hr Dietary Recall First Meal: 1 pack oatmeal + berries  Snack: apple Second Meal: chicken wings BBQ sauce and salad or [place] restaurant Snack: nectarines and peach or  peach + half sandwich Third Meal: 8-9 ounces salmon + caluflower + broccoli + celery + onion + bell pepper + low sodium dressing Snack:  Beverages: water, beer, half and half tea, sometimes frozen margarita   Estimated Energy Needs Calories: 1800   NUTRITION DIAGNOSIS  Overweight/obesity ([place]-3.3) related to past poor dietary habits and physical inactivity as evidenced by patient w/ planned sleeve gastrectomy surgery following dietary guidelines for continued weight loss.   NUTRITION INTERVENTION  Nutrition counseling (C-1) and education (E-2) to facilitate bariatric surgery goals.  Pre-Op Goals Progress & New Goals Continue: Cut all portions in half; use a smaller dish: unsuccessful with this Continue: limit to 3 servings of fruit per day Continue: try to get to bed a little earlier; unsuccessful with this continue: take sleep medication sooner in the evening to get to bed sooner and limit late night eating Continue: increase activity generally  continue: get in the jacuzzi around 9:30-10pm Continue: reduction of snacking   Handouts Provided Include    Learning Style & Readiness for Change Teaching method utilized: Visual & Auditory  Demonstrated degree of understanding via: Teach Back  Readiness Level: contemplative  Barriers to learning/adherence to lifestyle change: difficulty of change  RD's Notes for next Visit  Assess pts adherence to chosen goals    MONITORING & EVALUATION Dietary intake, weekly physical activity, body weight, and pre-op goals in 1 month.   Next Steps  Patient is  to return to [facility] for next SWL

## 2021-04-26 DIAGNOSIS — E119 Type 2 diabetes mellitus without complications: Secondary | ICD-10-CM | POA: Diagnosis not present

## 2021-04-26 DIAGNOSIS — I10 Essential (primary) hypertension: Secondary | ICD-10-CM | POA: Diagnosis not present

## 2021-04-27 DIAGNOSIS — H25813 Combined forms of age-related cataract, bilateral: Secondary | ICD-10-CM | POA: Diagnosis not present

## 2021-04-27 DIAGNOSIS — E1136 Type 2 diabetes mellitus with diabetic cataract: Secondary | ICD-10-CM | POA: Diagnosis not present

## 2021-05-08 ENCOUNTER — Other Ambulatory Visit: Payer: Self-pay

## 2021-05-08 ENCOUNTER — Encounter: Payer: Medicare Other | Attending: General Surgery | Admitting: Skilled Nursing Facility1

## 2021-05-08 DIAGNOSIS — E1140 Type 2 diabetes mellitus with diabetic neuropathy, unspecified: Secondary | ICD-10-CM | POA: Insufficient documentation

## 2021-05-08 DIAGNOSIS — Z6841 Body Mass Index (BMI) 40.0 and over, adult: Secondary | ICD-10-CM | POA: Insufficient documentation

## 2021-05-08 NOTE — Progress Notes (Signed)
Supervised Weight Loss Visit Bariatric Nutrition Education  Planned Surgery: sleeve  Pt Expectation of Surgery/ Goals: hip and knee surgery  4 out of 6 SWL Appointments    NUTRITION ASSESSMENT  Anthropometrics  Start weight at [facility]: 288 lbs (date: 12/22/2020)  Weight: 286 pounds Height: 70.5 in BMI: 40.46 kg/m2     Clinical  Medical hx: prostate cancer, diabetes, HTN, hyperlipidemia, COPD, gastric ulcer Medications: amlodipine, omperezole, b12, lipitor, metformin   Labs: 2 years ago 6.6 Notable signs/symptoms: none identified  Any previous deficiencies? Yes, vitmain B12  Lifestyle & Dietary Hx  Pt states he gets himself in the most trouble staying up so late and eating: pt has been making great changes here.    Pt states he weighs himself every day to every other day without issue stating it helps him monitor what he should be eating. Pt states he has been able to tolerate his hip pain better and able to walk more and be more active which is his goal, and now has been turning over in his bed easier which has been exciting.   Pt states his son just got the bypass stating he has done well so far.  Pt states nuts go so well with a  cold can of beer it is hard to control how much.  Pt state he will eat a peice of fruit instead of a sandwich at 11pm and has been trying to get up at 9am. Pt states he is taking a jazz cruise in January.   Estimated daily fluid intake: unknown oz Supplements:  Current average weekly physical activity:   24-Hr Dietary Recall: continued First Meal: 1 pack oatmeal + berries  Snack: apple Second Meal: chicken wings BBQ sauce and salad or [place] restaurant Snack: nectarines and peach or peach + half sandwich Third Meal: 8-9 ounces salmon + caluflower + broccoli + celery + onion + bell pepper + low sodium dressing Snack:  Beverages: water, beer, half and half tea, sometimes frozen margarita   Estimated Energy Needs Calories: 1800   NUTRITION  DIAGNOSIS  Overweight/obesity ([place]-3.3) related to past poor dietary habits and physical inactivity as evidenced by patient w/ planned sleeve gastrectomy surgery following dietary guidelines for continued weight loss.   NUTRITION INTERVENTION  Nutrition counseling (C-1) and education (E-2) to facilitate bariatric surgery goals.  Pre-Op Goals Progress & New Goals Continue: Cut all portions in half; use a smaller dish: unsuccessful with this Continue: limit to 3 servings of fruit per day Continue: try to get to bed a little earlier; unsuccessful with this continue: take sleep medication sooner in the evening to get to bed sooner and limit late night eating Continue: increase activity generally  continue: get in the jacuzzi around 9:30-10pm Continue: reduction of snacking NEW: use a smaller plate to cut back on portion sizes as dinner   Handouts Provided Include    Learning Style & Readiness for Change Teaching method utilized: Visual & Auditory  Demonstrated degree of understanding via: Teach Back  Readiness Level: contemplative  Barriers to learning/adherence to lifestyle change: difficulty of change  RD's Notes for next Visit  Assess pts adherence to chosen goals    MONITORING & EVALUATION Dietary intake, weekly physical activity, body weight, and pre-op goals in 1 month.   Next Steps  Patient is to return to [facility] for next SWL

## 2021-05-27 DIAGNOSIS — E119 Type 2 diabetes mellitus without complications: Secondary | ICD-10-CM | POA: Diagnosis not present

## 2021-05-27 DIAGNOSIS — F5101 Primary insomnia: Secondary | ICD-10-CM | POA: Diagnosis not present

## 2021-06-05 ENCOUNTER — Encounter: Payer: Medicare Other | Attending: General Surgery | Admitting: Skilled Nursing Facility1

## 2021-06-05 ENCOUNTER — Other Ambulatory Visit: Payer: Self-pay

## 2021-06-05 DIAGNOSIS — Z6841 Body Mass Index (BMI) 40.0 and over, adult: Secondary | ICD-10-CM | POA: Diagnosis not present

## 2021-06-05 DIAGNOSIS — E1140 Type 2 diabetes mellitus with diabetic neuropathy, unspecified: Secondary | ICD-10-CM | POA: Insufficient documentation

## 2021-06-05 NOTE — Progress Notes (Signed)
Supervised Weight Loss Visit Bariatric Nutrition Education  Planned Surgery: sleeve  Pt Expectation of Surgery/ Goals: hip and knee surgery  5 out of 6 SWL Appointments    NUTRITION ASSESSMENT  Anthropometrics  Start weight at [facility]: 288 lbs (date: 12/22/2020)  Weight: 285.3 pounds Height: 70.5 in BMI: 40.36 kg/m2     Clinical  Medical hx: prostate cancer, diabetes, HTN, hyperlipidemia, COPD, gastric ulcer Medications: amlodipine, omperezole, b12, lipitor, metformin   Labs: 2 years ago 6.6 Notable signs/symptoms: none identified  Any previous deficiencies? Yes, vitmain B12  Lifestyle & Dietary Hx  Pt states he gets himself in the most trouble staying up so late and eating: pt has been making great changes here.  Pt states he is taking a jazz cruise in January.   Pt states it has been hit and miss with watching his portions for dinner but moving through it. Pt states he wants to get to bed earlier but has not hit that yet. Pt states his activity has increased.  Pt states his achilles is nuts thinking maybe he will keep them in the trunk.   Estimated daily fluid intake: unknown oz Supplements:  Current average weekly physical activity:   24-Hr Dietary Recall: continued First Meal: 1 pack oatmeal + berries  Snack: apple Second Meal: chicken wings BBQ sauce and salad or [place] restaurant Snack: nectarines and peach or peach + half sandwich Third Meal: sandwich and apple Snack:  Beverages: water, beer, half and half tea, sometimes frozen margarita   Estimated Energy Needs Calories: 1800   NUTRITION DIAGNOSIS  Overweight/obesity ([place]-3.3) related to past poor dietary habits and physical inactivity as evidenced by patient w/ planned sleeve gastrectomy surgery following dietary guidelines for continued weight loss.   NUTRITION INTERVENTION  Nutrition counseling (C-1) and education (E-2) to facilitate bariatric surgery goals.  Pre-Op Goals Progress & New Goals Continue:  Cut all portions in half; use a smaller dish: unsuccessful with this Continue: limit to 3 servings of fruit per day Continue: try to get to bed a little earlier; unsuccessful with this continue: take sleep medication sooner in the evening to get to bed sooner and limit late night eating Continue: increase activity generally  continue: get in the jacuzzi around 9:30-10pm Continue: reduction of snacking continue: use a smaller plate to cut back on portion sizes as dinner NEW: limit your nuts   Handouts Provided Include    Learning Style & Readiness for Change Teaching method utilized: Visual & Auditory  Demonstrated degree of understanding via: Teach Back  Readiness Level: contemplative  Barriers to learning/adherence to lifestyle change: difficulty of change  RD's Notes for next Visit  Assess pts adherence to chosen goals    MONITORING & EVALUATION Dietary intake, weekly physical activity, body weight, and pre-op goals in 1 month.   Next Steps  Patient is to return to [facility] for next SWL

## 2021-06-19 DIAGNOSIS — Z23 Encounter for immunization: Secondary | ICD-10-CM | POA: Diagnosis not present

## 2021-06-26 DIAGNOSIS — E1165 Type 2 diabetes mellitus with hyperglycemia: Secondary | ICD-10-CM | POA: Diagnosis not present

## 2021-06-26 DIAGNOSIS — I10 Essential (primary) hypertension: Secondary | ICD-10-CM | POA: Diagnosis not present

## 2021-07-05 ENCOUNTER — Other Ambulatory Visit: Payer: Self-pay

## 2021-07-05 ENCOUNTER — Encounter: Payer: Medicare Other | Attending: General Surgery | Admitting: Skilled Nursing Facility1

## 2021-07-05 DIAGNOSIS — Z6841 Body Mass Index (BMI) 40.0 and over, adult: Secondary | ICD-10-CM | POA: Diagnosis not present

## 2021-07-05 DIAGNOSIS — E1140 Type 2 diabetes mellitus with diabetic neuropathy, unspecified: Secondary | ICD-10-CM | POA: Diagnosis not present

## 2021-07-05 NOTE — Progress Notes (Signed)
Supervised Weight Loss Visit Bariatric Nutrition Education  Planned Surgery: sleeve  Pt Expectation of Surgery/ Goals: hip and knee surgery  6 out of 6 SWL Appointments    NUTRITION ASSESSMENT  Pt completed visits.   Pt has cleared nutrition requirements.   Pt states he wants [facility] to know he has a trip at the end of January so would like his surgery to be after that time so in February.    Anthropometrics  Start weight at [facility]: 288 lbs (date: 12/22/2020)  Weight: 280 pounds Height: 70.5 in BMI: 39.61 kg/m2     Clinical  Medical hx: prostate cancer, diabetes, HTN, hyperlipidemia, COPD, gastric ulcer Medications: amlodipine, omperezole, b12, lipitor, metformin   Labs: 2 years ago 6.6 Notable signs/symptoms: none identified  Any previous deficiencies? Yes, vitmain B12  Lifestyle & Dietary Hx  Pt states he has learned a great deal about patience and sticking to the path with your eye on the prize which is getting his knee and hip surgeries and hernia repair.   Pt states he will really have to focus in on maintaining the diet after surgery and awareness of physical activity and not over doing it.   Pt states he was able to cut back on the nuts which probably caused that weight loss.  Pt states he has realized when he does not eat an apple right before bed he will not get up to urinate throughout the night.   Estimated daily fluid intake: unknown oz Supplements:  Current average weekly physical activity:   24-Hr Dietary Recall: continued First Meal: 1 pack oatmeal + berries  Snack: apple Second Meal: chicken wings BBQ sauce and salad or [place] restaurant Snack: nectarines and peach or peach + half sandwich Third Meal: sandwich and apple Snack:  Beverages: water, beer, half and half tea, sometimes frozen margarita   Estimated Energy Needs Calories: 1800   NUTRITION DIAGNOSIS  Overweight/obesity ([place]-3.3) related to past poor dietary habits and physical inactivity as  evidenced by patient w/ planned sleeve gastrectomy surgery following dietary guidelines for continued weight loss.   NUTRITION INTERVENTION  Nutrition counseling (C-1) and education (E-2) to facilitate bariatric surgery goals.  Pre-Op Goals Progress & New Goals Continue: Cut all portions in half; use a smaller dish: unsuccessful with this Continue: limit to 3 servings of fruit per day Continue: try to get to bed a little earlier; unsuccessful with this continue: take sleep medication sooner in the evening to get to bed sooner and limit late night eating Continue: increase activity generally  continue: get in the jacuzzi around 9:30-10pm Continue: reduction of snacking continue: use a smaller plate to cut back on portion sizes as dinner continue: limit your nuts   Handouts Provided Include    Learning Style & Readiness for Change Teaching method utilized: Visual & Auditory  Demonstrated degree of understanding via: Teach Back  Readiness Level: contemplative  Barriers to learning/adherence to lifestyle change: difficulty of change  RD's Notes for next Visit  Assess pts adherence to chosen goals    MONITORING & EVALUATION Dietary intake, weekly physical activity, body weight, and pre-op goals  Next Steps  Patient is to return to [facility] for pre-op class Pt has completed visits. No further supervised visits required/recomended

## 2021-07-11 DIAGNOSIS — Z1389 Encounter for screening for other disorder: Secondary | ICD-10-CM | POA: Diagnosis not present

## 2021-07-11 DIAGNOSIS — E785 Hyperlipidemia, unspecified: Secondary | ICD-10-CM | POA: Diagnosis not present

## 2021-07-11 DIAGNOSIS — E1169 Type 2 diabetes mellitus with other specified complication: Secondary | ICD-10-CM | POA: Diagnosis not present

## 2021-07-11 DIAGNOSIS — I10 Essential (primary) hypertension: Secondary | ICD-10-CM | POA: Diagnosis not present

## 2021-07-11 DIAGNOSIS — E119 Type 2 diabetes mellitus without complications: Secondary | ICD-10-CM | POA: Diagnosis not present

## 2021-07-11 DIAGNOSIS — Z1331 Encounter for screening for depression: Secondary | ICD-10-CM | POA: Diagnosis not present

## 2021-07-11 DIAGNOSIS — K219 Gastro-esophageal reflux disease without esophagitis: Secondary | ICD-10-CM | POA: Diagnosis not present

## 2021-08-11 DIAGNOSIS — I10 Essential (primary) hypertension: Secondary | ICD-10-CM | POA: Diagnosis not present

## 2021-08-11 DIAGNOSIS — E119 Type 2 diabetes mellitus without complications: Secondary | ICD-10-CM | POA: Diagnosis not present

## 2021-09-08 DIAGNOSIS — E119 Type 2 diabetes mellitus without complications: Secondary | ICD-10-CM | POA: Diagnosis not present

## 2021-09-08 DIAGNOSIS — I10 Essential (primary) hypertension: Secondary | ICD-10-CM | POA: Diagnosis not present

## 2021-09-27 DIAGNOSIS — K432 Incisional hernia without obstruction or gangrene: Secondary | ICD-10-CM | POA: Diagnosis not present

## 2021-09-27 DIAGNOSIS — I10 Essential (primary) hypertension: Secondary | ICD-10-CM | POA: Diagnosis not present

## 2021-09-27 DIAGNOSIS — Z8619 Personal history of other infectious and parasitic diseases: Secondary | ICD-10-CM | POA: Diagnosis not present

## 2021-09-27 DIAGNOSIS — E1169 Type 2 diabetes mellitus with other specified complication: Secondary | ICD-10-CM | POA: Diagnosis not present

## 2021-09-27 DIAGNOSIS — M159 Polyosteoarthritis, unspecified: Secondary | ICD-10-CM | POA: Diagnosis not present

## 2021-10-03 ENCOUNTER — Telehealth: Payer: Self-pay

## 2021-10-03 NOTE — Telephone Encounter (Signed)
? ?  Pre-operative Risk Assessment  ?  ?Patient Name: Travis Blanchard  ?DOB: 14-Aug-1945 ?MRN: [id number]  ? ?  ? ?Request for Surgical Clearance   ? ?Procedure:   BARIATRIC SURGERY ? ?Date of Surgery:  Clearance TBD                              ?   ?Surgeon:  DR. ERIC MCADAMS  ?Surgeon's Group or Practice Name:  [facility] ?Phone number:  [phone] ?Fax number:  [phone] ?  ?Type of Clearance Requested:   ?- Medical  ?  ?Type of Anesthesia:  Not Indicated ?  ?Additional requests/questions:   ? ?Signed, ?Jacinta Shoe   ?10/03/2021, 11:24 AM  ? ?

## 2021-10-03 NOTE — Telephone Encounter (Signed)
Pt will need New Pt appt. I will send message to Chart prep team.  ?

## 2021-10-03 NOTE — Telephone Encounter (Signed)
? ?  Name: Travis Blanchard  ?DOB: 07/13/1945  ?MRN: [id number] ? ?Primary Cardiologist: None (not previously seen by [facility] or cardiology per EMR) ? ?Chart reviewed as part of pre-operative protocol coverage. ? ?Because of STRATTON VILLWOCK has never been seen by one of our cardiologists, he will require an in-office visit in order to better assess preoperative cardiovascular risk. ? ?Pre-op covering staff: ?- Please schedule appointment and call patient to inform them.  ?- Please contact requesting surgeon's office via preferred method (i.e, phone, fax) to inform them of need for appointment prior to surgery. ? ?No antiplatelets/anticoags on MAR at present time, can be reviewed at time of new patient OV. ? ?Charlie Pitter, PA-C  ?10/03/2021, 2:30 PM  ? ?

## 2021-10-04 ENCOUNTER — Telehealth: Payer: Self-pay

## 2021-10-04 DIAGNOSIS — R002 Palpitations: Secondary | ICD-10-CM | POA: Insufficient documentation

## 2021-10-04 DIAGNOSIS — R0602 Shortness of breath: Secondary | ICD-10-CM | POA: Insufficient documentation

## 2021-10-04 HISTORY — DX: Shortness of breath: R06.02

## 2021-10-04 NOTE — Telephone Encounter (Signed)
NOTES SCANNED TO REFFERAL °

## 2021-10-04 NOTE — Telephone Encounter (Signed)
Patient has been scheduled for a new patient/pre op clearance appointment for tomorrow, 10/05/2021 at 4:30 PM. ?

## 2021-10-04 NOTE — Progress Notes (Signed)
?  ?Cardiology Office Note ? ? ?Date:  10/05/2021  ? ?ID:  Travis Blanchard, DOB 1946/05/29, MRN [id number] ? ?PCP:  Travis Meiers, MD  ?Cardiologist:   None ?Referring:  Travis Pickerel MD.  ? ?Chief Complaint  ?Patient presents with  ? Shortness of Breath  ? ? ?  ?History of Present Illness: ?Travis Blanchard is a 76 y.o. male who presents for evaluation of shortness of breath.  He is preop.  He is to have bariatric surgery.  He was sent by Travis Blanchard for preoperative evaluation as he has significant cardiovascular risk factors with diabetes mellitus and hypertension.  He has no past cardiac history.  However, he does have cardiovascular risk factors with diabetes and hypertension.  He has limited functional activity because of joint problems such as hip pain and knee pain.  He does do some walking slowly from his mailbox about 30 yards.  He might have some shortness of breath with this but he does not have chest pressure, neck or arm discomfort.  He has any palpitations, presyncope or syncope.  He had no weight gain or edema.  ? ? ?Past Medical History:  ?Diagnosis Date  ? Arthritis   ? Asthma   ? B12 deficiency   ? Cancer [facility])   ? Prostate  ? COPD (chronic obstructive pulmonary disease) ([place])   ? Decreased hearing   ? Diabetes mellitus without complication ([place])   ? Hyperlipidemia   ? Hypertension   ? Trouble in sleeping   ? Umbilical hernia   ? Vitamin D deficiency   ? ? ?Past Surgical History:  ?Procedure Laterality Date  ? COLONOSCOPY  2008  ? Dr. Gala Blanchard: 2 diminutive polyps in rectum (hyperplastic), otherwise normal  ? COLONOSCOPY  2000  ? Per notes in epic, history of adenomas at outside facility  ? COLONOSCOPY WITH PROPOFOL N/A 07/18/2017  ? Procedure: COLONOSCOPY WITH PROPOFOL;  Surgeon: Travis Dolin, MD;  [facility];  Service: Endoscopy;  Laterality: N/A;  8:45  ? HEMORRHOID SURGERY    ? KNEE ARTHROSCOPY    ? Left  ? KNEE ARTHROSCOPY    ? right  ? PROSTATE SURGERY  2000  ? Prostate  removal/Cancer  ? ? ? ?Current Outpatient Medications  ?Medication Sig Dispense Refill  ? acetaminophen (TYLENOL) 500 MG tablet Take 1,000 mg by mouth every 6 (six) hours as needed.    ? amLODipine (NORVASC) 5 MG tablet Take 5 mg by mouth daily.    ? atorvastatin (LIPITOR) 20 MG tablet Take 20 mg by mouth daily.     ? Cholecalciferol (VITAMIN D3) 1000 units CAPS Take by mouth daily.    ? loratadine (CLARITIN) 10 MG tablet Take 10 mg by mouth daily.    ? metFORMIN (GLUCOPHAGE) 500 MG tablet Take 500 mg by mouth 2 (two) times daily with a meal.     ? omeprazole (PRILOSEC) 20 MG capsule Take 20 mg by mouth daily as needed.    ? psyllium (METAMUCIL) 58.6 % powder Take 1 packet by mouth at bedtime. SUGAR FREE ORANGE FLAVORED    ? vitamin B-12 (CYANOCOBALAMIN) 1000 MCG tablet Take 1,000 mcg by mouth daily.    ? zolpidem (AMBIEN) 10 MG tablet Take 10 mg by mouth at bedtime.    ? albuterol (VENTOLIN HFA) 108 (90 Base) MCG/ACT inhaler Inhale 1 puff into the lungs every 6 (six) hours as needed for wheezing or shortness of breath. (Patient not taking: Reported on 10/05/2021)  1 Inhaler 0  ? fluticasone (FLONASE) 50 MCG/ACT nasal spray Place into both nostrils daily. (Patient not taking: Reported on 10/05/2021)    ? ipratropium-albuterol (DUONEB) 0.5-2.5 (3) MG/3ML SOLN Take 3 mLs by nebulization. (Patient not taking: Reported on 10/05/2021)    ? naproxen sodium (ALEVE) 220 MG tablet Take 220 mg by mouth at bedtime. (Patient not taking: Reported on 10/05/2021)    ? ?No current facility-administered medications for this visit.  ? ? ?Allergies:   Lisinopril and Penicillins  ? ? ?Social History:  The patient  reports that he quit smoking about 19 years ago. His smoking use included cigarettes. He has never used smokeless tobacco. He reports current alcohol use. He reports that he does not use drugs.  ? ?Family History:  The patient's family history includes Alcoholism in his father; Anxiety disorder in his father; Cancer in his father  and sister; Depression in his father and mother; Diabetes in his mother and sister; Eating disorder in his mother; Heart disease in his mother; Hyperlipidemia in his mother; Hypertension in his mother; Liver disease in his father; Obesity in his mother; Pancreatic cancer in his father; Stroke in his mother.  ? ? ?ROS:  Please see the history of present illness.   Otherwise, review of systems are positive for none.   All other systems are reviewed and negative.  ? ? ?PHYSICAL EXAM: ?VS:  BP 138/76   Pulse 80   Ht '5\' 10"'$  (1.778 m)   Wt 281 lb 12.8 oz (127.8 kg)   SpO2 97%   BMI 40.43 kg/m?  , BMI Body mass index is 40.43 kg/m?. ?GENERAL:  Well appearing ?HEENT:  Pupils equal round and reactive, fundi not visualized, oral mucosa unremarkable ?NECK:  No jugular venous distention, waveform within normal limits, carotid upstroke brisk and symmetric, no bruits, no thyromegaly ?LYMPHATICS:  No cervical, inguinal adenopathy ?LUNGS:  Clear to auscultation bilaterally ?BACK:  No CVA tenderness ?CHEST:  Unremarkable ?HEART:  PMI not displaced or sustained,S1 and S2 within normal limits, no S3, no S4, no clicks, no rubs, 6 murmurs ?ABD:  Flat, positive bowel sounds normal in frequency in pitch, no bruits, no rebound, no guarding, no midline pulsatile mass, no hepatomegaly, no splenomegaly, large umbilical hernia ?EXT:  2 plus pulses throughout, no edema, no cyanosis no clubbing ?SKIN:  No rashes no nodules ?NEURO:  Cranial nerves II through XII grossly intact, motor grossly intact throughout ?PSYCH:  Cognitively intact, oriented to person place and time ? ? ? ?EKG:  EKG is ordered today. ?The ekg ordered today demonstrates NSR, rate 80, axis WNL, intervals WNL, no acute ST T wave changes.  ? ? ?Recent Labs: ?No results found for requested labs within last 8760 hours.  ? ? ?Lipid Panel ?   ?Component Value Date/Time  ? CHOL 170 03/25/2018 1041  ? TRIG 88 03/25/2018 1041  ? HDL 52 03/25/2018 1041  ? CHOLHDL 3.3 03/25/2018  1041  ? LDLCALC 100 (H) 03/25/2018 1041  ? ?  ? ?Wt Readings from Last 3 Encounters:  ?10/05/21 281 lb 12.8 oz (127.8 kg)  ?07/05/21 280 lb (127 kg)  ?06/05/21 285 lb 4.8 oz (129.4 kg)  ?  ? ? ?Other studies Reviewed: ?Additional studies/ records that were reviewed today include: Labs. ?Review of the above records demonstrates:  Please see elsewhere in the note.   ? ? ?ASSESSMENT AND PLAN: ? ?SOB: The patient does have some shortness of breath.  He has a low functional level.  He has multiple cardiovascular risk factors.  He is going for surgery that could be at least moderate risk.  I think given this, according to ACC/AHA guidelines, risk ratification with stress testing is indicated.  He would be able to walk on a treadmill.  Therefore, I will order Lexiscan Myoview. ? ?DM: A1c is 6.1.  No change in therapy. ? ?HTN: Blood pressure is well controlled.  No change in therapy. ? ? ? ?Current medicines are reviewed at length with the patient today.  The patient does not have concerns regarding medicines. ? ?The following changes have been made:  no change ? ?Labs/ tests ordered today include:  ? ?Orders Placed This Encounter  ?Procedures  ? MYOCARDIAL PERFUSION IMAGING  ? EKG 12-Lead  ? ? ? ?Disposition:   FU with me as needed.    ? ? ?Signed, ?Minus Breeding, MD  ?10/05/2021 8:48 PM    ?[place] ? ? ? ?

## 2021-10-05 ENCOUNTER — Ambulatory Visit (INDEPENDENT_AMBULATORY_CARE_PROVIDER_SITE_OTHER): Payer: Medicare Other | Admitting: Cardiology

## 2021-10-05 ENCOUNTER — Encounter: Payer: Self-pay | Admitting: Cardiology

## 2021-10-05 VITALS — BP 138/76 | HR 80 | Ht 70.0 in | Wt 281.8 lb

## 2021-10-05 DIAGNOSIS — R002 Palpitations: Secondary | ICD-10-CM

## 2021-10-05 DIAGNOSIS — R0602 Shortness of breath: Secondary | ICD-10-CM | POA: Diagnosis not present

## 2021-10-05 NOTE — Patient Instructions (Signed)
Medication Instructions:  ?No changes ?*If you need a refill on your cardiac medications before your next appointment, please call your pharmacy* ? ? ?Lab Work: ?None ordered ?If you have labs (blood work) drawn today and your tests are completely normal, you will receive your results only by: ?MyChart Message (if you have MyChart) OR ?A paper copy in the mail ?If you have any lab test that is abnormal or we need to change your treatment, we will call you to review the results. ? ? ?Testing/Procedures: ?Your physician has requested that you have a lexiscan myoview. For further information please visit [email or web address]. Please follow instruction sheet, as given. This will take place at [address], [address] ? ?How to prepare for your Myocardial Perfusion Test: ?Do not eat or drink 3 hours prior to your test, except you may have water. ?Do not consume products containing caffeine (regular or decaffeinated) 12 hours prior to your test. (ex: coffee, chocolate, sodas, tea). ?Do bring a list of your current medications with you.  If not listed below, you may take your medications as normal. ?Do wear comfortable clothes (no dresses or overalls) and walking shoes, tennis shoes preferred (No heels or open toe shoes are allowed). ?Do NOT wear cologne, perfume, aftershave, or lotions (deodorant is allowed). ?The test will take approximately 3 to 4 hours to complete ?If these instructions are not followed, your test will have to be rescheduled. ? ? ?Follow-Up: ?At [facility], you and your health needs are our priority.  As part of our continuing mission to provide you with exceptional heart care, we have created designated Provider Care Teams.  These Care Teams include your primary Cardiologist (physician) and Advanced Practice Providers (APPs -  Physician Assistants and Nurse Practitioners) who all work together to provide you with the care you need, when you need it. ? ?We recommend signing up for the patient  portal called "MyChart".  Sign up information is provided on this After Visit Summary.  MyChart is used to connect with patients for Virtual Visits (Telemedicine).  Patients are able to view lab/test results, encounter notes, upcoming appointments, etc.  Non-urgent messages can be sent to your provider as well.   ?To learn more about what you can do with MyChart, go to [email or web address].   ? ?Your next appointment:   ?Follow up as needed with Dr. Percival Spanish ? ?

## 2021-10-09 DIAGNOSIS — E119 Type 2 diabetes mellitus without complications: Secondary | ICD-10-CM | POA: Diagnosis not present

## 2021-10-09 DIAGNOSIS — I10 Essential (primary) hypertension: Secondary | ICD-10-CM | POA: Diagnosis not present

## 2021-10-17 ENCOUNTER — Telehealth (HOSPITAL_COMMUNITY): Payer: Self-pay | Admitting: *Deleted

## 2021-10-17 NOTE — Telephone Encounter (Signed)
Patient given detailed instructions per Myocardial Perfusion Study Information Sheet for the test on 10/23/21 Patient notified to arrive 15 minutes early and that it is imperative to arrive on time for appointment to keep from having the test rescheduled. ? If you need to cancel or reschedule your appointment, please call the office within 24 hours of your appointment. . Patient verbalized understanding. Travis Blanchard ? ? ?

## 2021-10-23 ENCOUNTER — Ambulatory Visit (HOSPITAL_COMMUNITY): Payer: Medicare Other | Attending: Internal Medicine

## 2021-10-23 DIAGNOSIS — R0602 Shortness of breath: Secondary | ICD-10-CM | POA: Insufficient documentation

## 2021-10-23 MED ORDER — REGADENOSON 0.4 MG/5ML IV SOLN
0.4000 mg | Freq: Once | INTRAVENOUS | Status: AC
  Administered 2021-10-23: 0.4 mg via INTRAVENOUS

## 2021-10-23 MED ORDER — TECHNETIUM TC 99M TETROFOSMIN IV KIT
32.7000 | PACK | Freq: Once | INTRAVENOUS | Status: AC | PRN
  Administered 2021-10-23: 32.7 via INTRAVENOUS
  Filled 2021-10-23: qty 33

## 2021-10-24 ENCOUNTER — Inpatient Hospital Stay (HOSPITAL_COMMUNITY): Admission: RE | Admit: 2021-10-24 | Payer: Medicare Other | Source: Ambulatory Visit

## 2021-10-24 ENCOUNTER — Ambulatory Visit (HOSPITAL_COMMUNITY): Payer: Medicare Other | Attending: Cardiology

## 2021-10-24 LAB — MYOCARDIAL PERFUSION IMAGING
LV dias vol: 101 mL (ref 62–150)
LV sys vol: 41 mL
Nuc Stress EF: 59 %
Peak HR: 109 {beats}/min
Rest HR: 91 {beats}/min
Rest Nuclear Isotope Dose: 30.4 mCi
SDS: 1
SRS: 0
SSS: 1
ST Depression (mm): 0 mm
Stress Nuclear Isotope Dose: 32.7 mCi
TID: 1.08

## 2021-10-24 MED ORDER — TECHNETIUM TC 99M TETROFOSMIN IV KIT
30.4000 | PACK | Freq: Once | INTRAVENOUS | Status: AC | PRN
  Administered 2021-10-24: 30.4 via INTRAVENOUS
  Filled 2021-10-24: qty 31

## 2021-10-30 ENCOUNTER — Encounter: Payer: Self-pay | Admitting: *Deleted

## 2021-11-08 DIAGNOSIS — I10 Essential (primary) hypertension: Secondary | ICD-10-CM | POA: Diagnosis not present

## 2021-11-08 DIAGNOSIS — E119 Type 2 diabetes mellitus without complications: Secondary | ICD-10-CM | POA: Diagnosis not present

## 2021-12-07 ENCOUNTER — Ambulatory Visit (INDEPENDENT_AMBULATORY_CARE_PROVIDER_SITE_OTHER): Payer: Medicare Other | Admitting: Psychology

## 2021-12-07 DIAGNOSIS — F509 Eating disorder, unspecified: Secondary | ICD-10-CM

## 2021-12-07 NOTE — Progress Notes (Unsigned)
Travis Blanchard participated in the session via  video from home and consented to treatment. We met online due to COVID-19 pandemic. Identify was confirmed via three factors. Therapist participated from home office.The patient completed the psychiatric diagnostic evaluation (complete history, including past, family, and social history, psychiatric history, and establishment of a tentative diagnosis) and the bariatric assessment for a total of    95       minutes and code [phone] was billed.   Additionally, during this initial appointment the clinician completed the scoring of the following measures:  Beck Anxiety Inventory (BAI) (5 minutes), Beck's Depression Index (5 minutes), Eating Disorder Diagnostic Scale (EDDS) (5 minutes), Mental Status Examination (MSE) (10 minutes [administration and scoring]), & Mood Disorder Questionnaire (MDQ) (5 minutes [scoring) A total of 30 minutes was spent on the scoring of the aforementioned measures and code [phone] was billed.   Please see the bariatric assessment, under documents, for additional details. The patient was scheduled for a feedback session. Clinician completed the written report which includes integration of patient data, interpretation of standardized test results, interpretation of clinical data, review of referral from surgeon & clinical decision making (1 hour in total).   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

## 2021-12-08 NOTE — Progress Notes (Addendum)
Client Name: Travis Blanchard                               MRN:  [id number] Date of Birth: 06-10-1946                                                Date of Evaluation: 12/07/21 Total Assessment Time: 95 Minutes                                          Date of Report: 12/07/21 Evaluator: Buena Irish, MSW, LCSW                                  Date of Follow-up: 01/17/2022 Referring Physician: Dr.  Redmond Pulling, [facility]   Sources of Information Background Interview Bariatric Questionnaire [place] Interview for Gastric Bypass Beck Anxiety Inventory (BAI) Beck Depression Inventory, 2nd Edition (BDI-II) Eating Disorder Diagnostic Scale (EDDS) Mental Status Examination Mood Disorder Questionnaire (MDQ) Review of Medical Record (provided by [facility])  Patient Identification and Chief Complaint: Travis Blanchard was born in [place] and currently lives in [place], [place]. [name] and [name], his wife, have been married for almost 71 years. Travis Blanchard has two sons and Travis Blanchard has a daughter. [name] discussed a family history of heart disease (mother and father), cancer (mother), and obesity (father, sister, and brother). Travis Blanchard denied a family history of mental health concerns or substance use. Travis Blanchard's family, including [name], continue to be supportive of his pursuit of bariatric surgery. His wife is also pursuing bariatric surgery. 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 Travis Blanchard was previously considered for Bariatric surgery in 2020. He noted life events affecting his ability to complete the process at that time. He recently completed a 50-monthprogram with his registered dietitian which was required for the bariatric process. Travis Blanchard noted his son having bariatric surgery ~ 3 months ago and noted  the surgery being successful.   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 He endorsed 1.5x cups of coffee a day. Toddrick endorsed sporadic alcohol use, primarily beer. He is aware that he will need to discontinue alcohol and caffeine use post-operatively.   JRefaelhas a pending hernia surgery that is which he hopes will be completed during his bariatric surgery. He is also slated for a colonoscopy pending the hernia surgery.   JDarionhas a history of hypertension, diabetes, shortness of breath, COPD, arthritis in neck, spine, lower back, hips, and knees. He has a history of lymphedema, umbilical hernia, and reflux. He denied any history of mental health concerns or substance use. He has a history of numerous surgeries including prostate removal (1995), hemorrhoid (1974), and arthroscopic knee surgery (1986).  Patient's Understanding of the Procedure: Reasons for seeking bariatric surgery (perception of weight problem/motivation to lose weight): Understanding of surgical procedure/how well researched/awareness of risks involved: Travis Blanchard elected to receive the Laparoscopic Vertical Sleeve. His motivation for surgery is to improve his health, mobility, and reduce his reliance on medication. He has a strong family history of diabetes and thyroid issues. He provided an adequately detailed description of the surgery during the session but would benefit from an overview with his surgeon, prior to surgery. He provided also provided an acceptable description of the necessary changes in eating post-surgery including the initial liquid diet followed by the second and third stages but would benefit from a refresher with his surgeon or registered dietician. Travis Blanchard  received a review with Dr. Redmond Pulling on 12/28/21 reviewing the surgical process and risks associated with. Additionally, he reviewed the dietary regimen with his registered dietician on 12/11/21.      History of Weight Gains and Losses: Current weight, height, desired weight: 278lbs - 283lbs, 5'11", ~ 235.  Rate current activity level from 1 (sedentary) to 10 (very active): 4, Limited due to knee pain, hip pain, and hernia.  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  Eating Disorder History (binging, purging, laxatives): Travis Blanchard denied any history of disordered eating.   History of dieting and results:  June 2022 - December 2022: 6 months monitored diet with RD, Travis Blanchard, lost 8lbs, currently maintained most weight-loss.  ~2019: Monitored diet and exercise, via [facility].  ~2017 for 8-10 months with prescribed diet with PCP, Travis Blanchard.    Diet and Plans for the Future:  Breakfast Oatmeal and fruit.   Lunch Apple, peanut butter sandwich, or [place] sandwich.   Dinner [place] with protein.   Snacks Rarely.     Plans for exercise post-surgery: Consistent exercise, yardwork, complete home repairs.  How pt copes with stress: Listen to music, watch TV, sit on back porch.  Other planned changes for the future: Improve overall health and fitness.   Mental Status Examination: Travis Blanchard was early to the session which was completed via video-conferencing interview. He was dressed casually and his hygiene was observed to be good. He was oriented to person, place, and time. His attitude was cooperative and cheerful. There were no unusual psychomotor movements or changes. Speech patterns were normal in rate, tone, volume, and without pressure. Affect was reactive and mood congruent. Thought processes were goal-directed and logical. Memory and concentration for both long and short-term were intact. Insight and judgement were good overall.  Intellectual and Psychological Functioning Travis Blanchard was very  warm and friendly. He presented with no significant formal thought disorder. He appears to be primarily facing health stressors, which aligns with his interest in the bariatric process. Jeronimo Greaves completed the Mood disorder Questionnaire (MDQ) -and did not meet criteria for Bipolar Disorder. Additionally, he completed the following measures, Beck's Depression Index (BDI) netted a score of  4/63, which is indicative of "normal" ups and downs (depressive symptomatology),  Beck Anxiety Inventory (BAI) netted a score   2/63 which is indicative of  minimal anxiety symptomatology, and Eating Disorder Diagnostic Scale - in which he did not meet criteria for an eating disorder. Additionally, Jeronimo Greaves completed [facility] Interview for Gastric Bypass during the session, which did not identify any history of disordered eating. The results, overall, were unremarkable.   Conclusions & Recommendations  Mr. Pasquale Matters is a 76 year-old [place] American male who was referred to the [place]  division by Dr. Redmond Pulling at [facility] for a psychological evaluation to determine his suitability for bariatric surgery.   With regard to bariatric surgery, Mr. Aleksander Edmiston appears to be highly motivated and has a solid understanding of the gastric bypass surgery, as well as risks and lifestyle changes needed to  promote post-surgical weight loss success. Results of this evaluation yielded a past history of no reported mental health concerns.   During the initial evaluation, it was determined that Daven would benefit from reviewing the dietary regimen and its varying stages. Additionally, he would benefit from a review of the surgical process and the risks associated with. Finally, he was encouraged to make dietary changes including reducing the volume of his night-time meal and reducing portion sizes of his legume consumption (peanuts). These concerns were communicated to his registered  dietician and the surgical practice who referred Ludie for the evaluation, via email, on 12/08/21. The referral source provided feedback that Dontavius has received additional psycho-education regarding the aforementioned concerns. Theon's chart reflects his phone follow-up with his registered dietician to review the dietary concerns highlighted above.   At this time, Mr. Gergory Biello appears to be able to make an informed decision about the surgery he is contemplating. He expressed understanding of the post-surgical requirements and appears to me motivated to complete said requirements. If his surgery is scheduled more than 3 months from today's date (01/17/2022) he is required to schedule a follow-up visit in order for this examiner to briefly re-evaluate his psychological status at that time. This follow-up visit should occur within two months of his scheduled surgery date.  The following recommendations are offered in order to promote Mr. Diago Haik health and well-being:  It is recommended that the patient participate in educational sessions regarding a healthy diet and post-operative meal planning with a dietician or other health care providers.   Re-evaluation. If Mr. Ivan Lacher surgery is scheduled more than three months from his date of approval, his is required to contact our office [phone]) for a brief check-in appointment about a month prior to his surgery date.   Nutritional Counseling. Mr. Jeson Camacho is strongly encouraged to continue attending nutritional counseling appointments in order to plan a healthy diet and post-operative meals. It should be noted that Mr. Monnie Anspach daily diet includes foods high in sugar, fats, and carbohydrates; he is encouraged to make recommended changes to her diet prior to surgery in order to increase the chances of continuing a healthy diet after surgery.   Exercise. Mr. Reice Bienvenue is encouraged to participate in educational sessions on exercise  that will be appropriate for her medical conditions and support her weight loss plans in a safe and healthy manner. Specifically, he is encouraged to consider participating in the Bariatric Exercise and Lifestyle Transformation (BELT) program, a partnership between [place]. There, you'll join fellow [facility] bariatric patients three times a week for personalized aerobic and strength training instruction, as well as educational sessions on diet, exercise and behavior modification strategies. BELT meets at [place] at [employer].   Support groups. The patient is encouraged to join a support group to give him/her encouragement as s/he faces the psychological adjustments of bariatric surgery and the need to significantly adjust one's meals and food choices. A list of support groups offered through [place] can be found through the bariatrics department website: MetroMeds.nl  Self-help resources.  To develop strategies for managing emotional  difficulties encountered before and after weight loss surgery, the patient is encouraged to read The Emotional First + Aid Kit: A Practical Guide to Life After Bariatric Surgery, Second Edition by  Caren Griffins L. Sheppard Coil, PsyD. Examples of strategies discussed in this book include relieving stress without using food, developing and maintaining an exercise program, preventing relapse, etc. Mr. Raheim Beutler is strongly encouraged to practice mindful eating, the goal of which is to pay close attention to the smell, sight, taste, temperature, texture, etc. of food. Eating mindfully helps to eat slower while enjoying food more fully. Useful books on mindful eating include Savor: Mindful Eating, Mindful Life and How to Eat, both by mindfulness expert Thich Nhat Hanh.  Mental health treatment. For additional support either prior to or after the  surgery, Mr. Matas Burrows may consider seeking the care of a therapist (counselor, psychologist) in order to develop skills for coping with the adjustment to a new lifestyle. Available therapists include this examiner, as well as other clinicians within the [facility] [phone]). he may also seek other in-network providers in the community by searching online at DustingSprays.pl.   General recommendations for bariatric surgery: Replace the habit of late night snacking with something else (e.g., chewing gum, drinking water, or a relaxing activity like reading or crosswords that occupies your hands) and consider going to bed earlier.  Practice eating 4-6 meals per day. Each meal should last about 20 minutes. Practice drinking liquids 30 minutes before or after meals. Keep a food diary for 1 week. Record all foods eaten during the day, including snacks and drinks. Be very specific and very honest.  Get into the habit of reading food labels to evaluate content of protein, sugars, carbohydrates, sodium, etc.  Continue to eat lots of vegetables.  Prepare meals at home, rather than take-out or fast food.  Take multivitamins including zinc and iron.  Develop exercise plans, including a low-impact and safe exercise plan to start 4-5 weeks into recovery, and a more intensive exercise plan for later.  Determine who will take care of any major responsibilities (particularly those involving physical activity, such as childcare) in the early stages of your recovery.  Educate family and friends who will be involved in your recovery about the extent and importance of your new lifestyle changes. The more they know, the better they can support you and help you stay on track!                                                            12/07/21 __________________________________    ______________________ Buena Irish, MSW, LCSW      Date Licensed Clinical Social Worker [facility] [phone]

## 2021-12-11 ENCOUNTER — Telehealth: Payer: Self-pay | Admitting: Skilled Nursing Facility1

## 2021-12-11 NOTE — Telephone Encounter (Signed)
Reached out to pt at request of Alene Mires for a nutrition update.   Pt states it had been a while so he was unable to answer the questions approprietly with the psychologist.    Dietitian reviewed the main points of the dietary protocol and behaviors to continue to work on as well as the progression post surgery.    Pt requests for this information to be summarized and sent to his email. Dietitian complied to this request.

## 2021-12-15 ENCOUNTER — Ambulatory Visit: Payer: Medicare Other | Admitting: Psychology

## 2021-12-25 DIAGNOSIS — I10 Essential (primary) hypertension: Secondary | ICD-10-CM | POA: Diagnosis not present

## 2021-12-25 DIAGNOSIS — E119 Type 2 diabetes mellitus without complications: Secondary | ICD-10-CM | POA: Diagnosis not present

## 2021-12-25 DIAGNOSIS — M130 Polyarthritis, unspecified: Secondary | ICD-10-CM | POA: Diagnosis not present

## 2021-12-28 DIAGNOSIS — I10 Essential (primary) hypertension: Secondary | ICD-10-CM | POA: Diagnosis not present

## 2021-12-28 DIAGNOSIS — E669 Obesity, unspecified: Secondary | ICD-10-CM | POA: Diagnosis not present

## 2021-12-28 DIAGNOSIS — E1169 Type 2 diabetes mellitus with other specified complication: Secondary | ICD-10-CM | POA: Diagnosis not present

## 2021-12-28 DIAGNOSIS — K432 Incisional hernia without obstruction or gangrene: Secondary | ICD-10-CM | POA: Diagnosis not present

## 2021-12-28 DIAGNOSIS — M159 Polyosteoarthritis, unspecified: Secondary | ICD-10-CM | POA: Diagnosis not present

## 2022-01-17 ENCOUNTER — Ambulatory Visit (INDEPENDENT_AMBULATORY_CARE_PROVIDER_SITE_OTHER): Payer: Medicare Other | Admitting: Psychology

## 2022-01-17 DIAGNOSIS — F509 Eating disorder, unspecified: Secondary | ICD-10-CM

## 2022-01-17 NOTE — Progress Notes (Signed)
Time Spent: 11:05  am - 11:40 am : 25 Minutes  Travis Blanchard participated in the session via  video from home and consented to treatment. We met online due to COVID-19 pandemic. Identify was confirmed via three factors. Therapist participated from home office. We completed the interactive feedback session which includes reviewing the following measures: Beck Anxiety Inventory (BAI), Beck's Depression Index (BDI), Eating Disorder Diagnostic Scale (EDDS), Mental Status Examination (MSE), & Mood Disorder Questionnaire (MDQ). During this interactive feedback session we discussed the results of the aforementioned measures, treatment recommendations, and the final determination regarding the psychological approval for bariatric surgery. [name] is approved for surgery.   Code [phone] was billed to account for written report which includes integration of patient data, interpretation of standardized test results, interpretation of clinical data, & clinical decision making (60 minutes in total) completed on 12/07/21.  The interactive feedback session was completed today and a total of 28 minutes was spent on feedback alone. Code [phone] was billed for feedback session.   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

## 2022-01-24 DIAGNOSIS — E119 Type 2 diabetes mellitus without complications: Secondary | ICD-10-CM | POA: Diagnosis not present

## 2022-01-24 DIAGNOSIS — I10 Essential (primary) hypertension: Secondary | ICD-10-CM | POA: Diagnosis not present

## 2022-02-01 ENCOUNTER — Encounter: Payer: Self-pay | Admitting: Gastroenterology

## 2022-02-05 ENCOUNTER — Encounter: Payer: Medicare Other | Attending: General Surgery | Admitting: Skilled Nursing Facility1

## 2022-02-05 ENCOUNTER — Encounter: Payer: Self-pay | Admitting: Skilled Nursing Facility1

## 2022-02-05 DIAGNOSIS — E1140 Type 2 diabetes mellitus with diabetic neuropathy, unspecified: Secondary | ICD-10-CM

## 2022-02-05 DIAGNOSIS — Z713 Dietary counseling and surveillance: Secondary | ICD-10-CM | POA: Insufficient documentation

## 2022-02-05 DIAGNOSIS — E119 Type 2 diabetes mellitus without complications: Secondary | ICD-10-CM | POA: Insufficient documentation

## 2022-02-05 NOTE — Progress Notes (Signed)
Pre-Operative Nutrition Class:    Patient was seen on 02/05/2022 for Pre-Operative Bariatric Surgery Education at the [facility].    Surgery date: 03/06/2022 Surgery type: sleeve Start weight at [facility]: 288 Weight today: 279.6  Samples given per MNT protocol. Patient educated on appropriate usage:  Bariatric Advantage Multivitamin Lot # [id number] Exp: 08/24   Bariatric Advantage Calcium  Lot # [id number] Exp: 10/18/2022   Protein Shake Lot # [id number] / [id number] Exp: 08 Apr 2022 /  09 Jul 2022  The following the learning objectives were met by the patient during this course: Identify Pre-Op Dietary Goals and will begin 2 weeks pre-operatively Identify appropriate sources of fluids and proteins  State protein recommendations and appropriate sources pre and post-operatively Identify Post-Operative Dietary Goals and will follow for 2 weeks post-operatively Identify appropriate multivitamin and calcium sources Describe the need for physical activity post-operatively and will follow MD recommendations State when to call healthcare provider regarding medication questions or post-operative complications When having a diagnosis of diabetes understanding hypoglycemia symptoms and the inclusion of 1 complex carbohydrate per meal  Handouts given during class include: Pre-Op Bariatric Surgery Diet Handout Protein Shake Handout Post-Op Bariatric Surgery Nutrition Handout BELT Program Information Flyer Support Group Information Flyer [facility] Bariatric Supplements Price List  Follow-Up Plan: Patient will follow-up at [facility] 2 weeks post operatively for diet advancement per MD.

## 2022-02-08 ENCOUNTER — Ambulatory Visit: Payer: Self-pay | Admitting: General Surgery

## 2022-02-08 DIAGNOSIS — E1169 Type 2 diabetes mellitus with other specified complication: Secondary | ICD-10-CM | POA: Diagnosis not present

## 2022-02-08 DIAGNOSIS — E669 Obesity, unspecified: Secondary | ICD-10-CM | POA: Diagnosis not present

## 2022-02-08 DIAGNOSIS — M159 Polyosteoarthritis, unspecified: Secondary | ICD-10-CM | POA: Diagnosis not present

## 2022-02-08 DIAGNOSIS — K432 Incisional hernia without obstruction or gangrene: Secondary | ICD-10-CM | POA: Diagnosis not present

## 2022-02-08 DIAGNOSIS — E1140 Type 2 diabetes mellitus with diabetic neuropathy, unspecified: Secondary | ICD-10-CM

## 2022-02-08 DIAGNOSIS — I10 Essential (primary) hypertension: Secondary | ICD-10-CM | POA: Diagnosis not present

## 2022-02-23 ENCOUNTER — Other Ambulatory Visit (HOSPITAL_COMMUNITY): Payer: Self-pay

## 2022-02-23 NOTE — Patient Instructions (Signed)
DUE TO COVID-19 ONLY TWO VISITORS  (aged 74 and older)  ARE ALLOWED TO COME WITH YOU AND STAY IN THE WAITING ROOM ONLY DURING PRE OP AND PROCEDURE.   **NO VISITORS ARE ALLOWED IN THE SHORT STAY AREA OR RECOVERY ROOM!!**  IF YOU WILL BE ADMITTED INTO THE HOSPITAL YOU ARE ALLOWED ONLY FOUR SUPPORT PEOPLE DURING VISITATION HOURS ONLY (7 AM -8PM)   The support person(s) must pass our screening, gel in and out, and wear a mask at all times, including in the patient's room. Patients must also wear a mask when staff or their support person are in the room. Visitors GUEST BADGE MUST BE WORN VISIBLY  One adult visitor may remain with you overnight and MUST be in the room by 8 P.M.     Your procedure is scheduled on: 03/06/22   Report to [facility] Main Entrance    Report to admitting at 5:30 AM   Call this number if you have problems the morning of surgery [phone]   Do not eat food :After 6:00 PM.   you may have the following liquids until _4:30  AM/ DAY OF SURGERY  Water Black Coffee (sugar ok, NO MILK/CREAM OR CREAMERS)  Tea (sugar ok, NO MILK/CREAM OR CREAMERS) regular and decaf                             Plain Jell-O (NO RED)                                           Fruit ices (not with fruit pulp, NO RED)                                     Popsicles (NO RED)                                                                  Juice: apple, WHITE grape, WHITE cranberry Sports drinks like Gatorade (NO RED)                   The day of surgery:  Drink ONE G2 at  4:15 AM the morning of surgery. Drink in one sitting. Do not sip.  This drink was given to you during your hospital  pre-op appointment visit. Nothing else to drink after completing the   G2.at 4:30 AM          If you have questions, please contact your surgeon's office.   FOLLOW BOWEL PREP AND ANY ADDITIONAL PRE OP INSTRUCTIONS YOU RECEIVED FROM YOUR SURGEON'S OFFICE!!!     Oral Hygiene is also important  to reduce your risk of infection.                                    Remember - BRUSH YOUR TEETH THE MORNING OF SURGERY WITH YOUR REGULAR TOOTHPASTE  NO SOLID FOOD AFTER 6:00 PM THE NIGHT BEFORE YOUR SURGERY.   YOU MAY  DRINK CLEAR FLUIDS THE MORNING OF SURGERY UNTIL:4:30 AM   THE G2 DRINK YOU DRINK BEFORE YOU LEAVE HOME WILL BE THE LAST FLUIDS YOU DRINK BEFORE SURGERY THEN NOTHING MORE BY MOUTH.   PAIN IS EXPECTED AFTER SURGERY AND WILL NOT BE COMPLETELY ELIMINATED.   AMBULATION AND TYLENOL WILL HELP REDUCE INCISIONAL AND GAS PAIN. MOVEMENT IS KEY!  YOU ARE EXPECTED TO BE OUT OF BED WITHIN 4 HOURS OF ADMISSION TO YOUR PATIENT ROOM.  SITTING IN THE RECLINER THROUGHOUT THE DAY IS IMPORTANT FOR DRINKING FLUIDS AND MOVING GAS THROUGHOUT THE GI TRACT.  COMPRESSION STOCKINGS SHOULD BE WORN [place] UNLESS YOU ARE WALKING.   INCENTIVE SPIROMETER SHOULD BE USED EVERY HOUR WHILE AWAKE TO DECREASE POST-OPERATIVE COMPLICATIONS SUCH AS PNEUMONIA.  WHEN DISCHARGED HOME, IT IS IMPORTANT TO CONTINUE TO WALK EVERY HOUR AND USE THE INCENTIVE SPIROMETER EVERY HOUR    Take these medicines the morning of surgery with A SIP OF WATER: Lipitor, Amlodipine, Flonase, Omeprazole             Use your inhalers and bring with you  DO NOT TAKE ANY ORAL DIABETIC MEDICATIONS DAY OF YOUR SURGERY  Bring CPAP mask and tubing day of surgery.                              You may not have any metal on your body including  jewelry, and body piercing             Do not wear  lotions, powders, cologne, or deodorant                Men may shave face and neck.   Do not bring valuables to the hospital. [place].   Contacts, dentures or bridgework may not be worn into surgery.   Bring small overnight bag day of surgery.   DO NOT [place]. PHARMACY WILL DISPENSE MEDICATIONS LISTED ON YOUR MEDICATION LIST TO  YOU DURING YOUR ADMISSION [place]!       Special Instructions: Bring a copy of your healthcare power of attorney and living will documents  the day of surgery if you haven't scanned them before.              Please read over the following fact sheets you were given: IF YOU HAVE QUESTIONS ABOUT YOUR PRE-OP INSTRUCTIONS PLEASE CALL [phone]     [facility] - Preparing for Surgery Before surgery, you can play an important role.  Because skin is not sterile, your skin needs to be as free of germs as possible.  You can reduce the number of germs on your skin by washing with CHG (chlorahexidine gluconate) soap before surgery.  CHG is an antiseptic cleaner which kills germs and bonds with the skin to continue killing germs even after washing. Please DO NOT use if you have an allergy to CHG or antibacterial soaps.  If your skin becomes reddened/irritated stop using the CHG and inform your nurse when you arrive at Short Stay.  You may shave your face/neck. Please follow these instructions carefully:  1.  Shower with CHG Soap the night before surgery and the  morning of Surgery.  2.  If you choose to wash your hair, wash your hair first as usual with your  normal  shampoo.  3.  After you shampoo, rinse your hair and body thoroughly to remove the  shampoo.                            4.  Use CHG as you would any other liquid soap.  You can apply chg directly  to the skin and wash                       Gently with a scrungie or clean washcloth.  5.  Apply the CHG Soap to your body ONLY FROM THE NECK DOWN.   Do not use on face/ open                           Wound or open sores. Avoid contact with eyes, ears mouth and genitals (private parts).                       Wash face,  Genitals (private parts) with your normal soap.             6.  Wash thoroughly, paying special attention to the area where your surgery  will be performed.  7.  Thoroughly rinse your body with warm water from the neck  down.  8.  DO NOT shower/wash with your normal soap after using and rinsing off  the CHG Soap.                9.  Pat yourself dry with a clean towel.            10.  Wear clean pajamas.            11.  Place clean sheets on your bed the night of your first shower and do not  sleep with pets. Day of Surgery : Do not apply any lotions/deodorants the morning of surgery.  Please wear clean clothes to the hospital/surgery center.  FAILURE TO FOLLOW THESE INSTRUCTIONS MAY RESULT IN THE CANCELLATION OF YOUR SURGERY   ________________________________________________________________________   Incentive Spirometer  An incentive spirometer is a tool that can help keep your lungs clear and active. This tool measures how well you are filling your lungs with each breath. Taking long deep breaths may help reverse or decrease the chance of developing breathing (pulmonary) problems (especially infection) following: A long period of time when you are unable to move or be active. BEFORE THE PROCEDURE  If the spirometer includes an indicator to show your best effort, your nurse or respiratory therapist will set it to a desired goal. If possible, sit up straight or lean slightly forward. Try not to slouch. Hold the incentive spirometer in an upright position. INSTRUCTIONS FOR USE  Sit on the edge of your bed if possible, or sit up as far as you can in bed or on a chair. Hold the incentive spirometer in an upright position. Breathe out normally. Place the mouthpiece in your mouth and seal your lips tightly around it. Breathe in slowly and as deeply as possible, raising the piston or the ball toward the top of the column. Hold your breath for 3-5 seconds or for as long as possible. Allow the piston or ball to fall to the bottom of the column. Remove the mouthpiece from your mouth and breathe out normally. Rest for a few seconds and repeat Steps 1 through 7 at  least 10 times every 1-2 hours when you are awake.  Take your time and take a few normal breaths between deep breaths. The spirometer may include an indicator to show your best effort. Use the indicator as a goal to work toward during each repetition. After each set of 10 deep breaths, practice coughing to be sure your lungs are clear. If you have an incision (the cut made at the time of surgery), support your incision when coughing by placing a pillow or rolled up towels firmly against it. Once you are able to get out of bed, walk around indoors and cough well. You may stop using the incentive spirometer when instructed by your caregiver.  RISKS AND COMPLICATIONS Take your time so you do not get dizzy or light-headed. If you are in pain, you may need to take or ask for pain medication before doing incentive spirometry. It is harder to take a deep breath if you are having pain. AFTER USE Rest and breathe slowly and easily. It can be helpful to keep track of a log of your progress. Your caregiver can provide you with a simple table to help with this. If you are using the spirometer at home, follow these instructions: [place] IF:  You are having difficultly using the spirometer. You have trouble using the spirometer as often as instructed. Your pain medication is not giving enough relief while using the spirometer. You develop fever of 100.5 F (38.1 C) or higher. SEEK IMMEDIATE MEDICAL CARE IF:  You cough up bloody sputum that had not been present before. You develop fever of 102 F (38.9 C) or greater. You develop worsening pain at or near the incision site. MAKE SURE YOU:  Understand these instructions. Will watch your condition. Will get help right away if you are not doing well or get worse. Document Released: 11/05/2006 Document Revised: 09/17/2011 Document Reviewed: 01/06/2007 [facility] Patient Information 2014 [place], [place].   ________________________________________________________________________

## 2022-02-26 ENCOUNTER — Encounter (HOSPITAL_COMMUNITY): Payer: Self-pay

## 2022-02-26 ENCOUNTER — Other Ambulatory Visit: Payer: Self-pay

## 2022-02-26 ENCOUNTER — Encounter (HOSPITAL_COMMUNITY)
Admission: RE | Admit: 2022-02-26 | Discharge: 2022-02-26 | Disposition: A | Discharge status: Home / Self Care | Payer: Medicare Other | Source: Ambulatory Visit | Attending: General Surgery | Admitting: General Surgery

## 2022-02-26 DIAGNOSIS — Z01812 Encounter for preprocedural laboratory examination: Secondary | ICD-10-CM | POA: Diagnosis not present

## 2022-02-26 DIAGNOSIS — E1140 Type 2 diabetes mellitus with diabetic neuropathy, unspecified: Secondary | ICD-10-CM

## 2022-02-26 DIAGNOSIS — I10 Essential (primary) hypertension: Secondary | ICD-10-CM | POA: Diagnosis not present

## 2022-02-26 LAB — COMPREHENSIVE METABOLIC PANEL
ALT: 13 U/L (ref 0–44)
AST: 15 U/L (ref 15–41)
Albumin: 4.5 g/dL (ref 3.5–5.0)
Alkaline Phosphatase: 78 U/L (ref 38–126)
Anion gap: 8 (ref 5–15)
BUN: 11 mg/dL (ref 8–23)
CO2: 23 mmol/L (ref 22–32)
Calcium: 9.4 mg/dL (ref 8.9–10.3)
Chloride: 106 mmol/L (ref 98–111)
Creatinine, Ser: 0.94 mg/dL (ref 0.61–1.24)
GFR, Estimated: >60 mL/min (ref >60)
Glucose, Bld: 106 mg/dL — ABNORMAL HIGH (ref 70–99)
Potassium: 3.9 mmol/L (ref 3.5–5.1)
Sodium: 137 mmol/L (ref 135–145)
Total Bilirubin: <0.1 mg/dL — ABNORMAL LOW (ref 0.3–1.2)
Total Protein: 8 g/dL (ref 6.5–8.1)

## 2022-02-26 LAB — CBC WITH DIFFERENTIAL/PLATELET
Abs Immature Granulocytes: 0.03 10*3/uL (ref 0.00–0.07)
Basophils Absolute: 0 10*3/uL (ref 0.0–0.1)
Basophils Relative: 0 %
Eosinophils Absolute: 0.1 10*3/uL (ref 0.0–0.5)
Eosinophils Relative: 1 %
HCT: 46.9 % (ref 39.0–52.0)
Hemoglobin: 15.6 g/dL (ref 13.0–17.0)
Immature Granulocytes: 0 %
Lymphocytes Relative: 31 %
Lymphs Abs: 2.8 10*3/uL (ref 0.7–4.0)
MCH: 29.5 pg (ref 26.0–34.0)
MCHC: 33.3 g/dL (ref 30.0–36.0)
MCV: 88.8 fL (ref 80.0–100.0)
Monocytes Absolute: 0.5 10*3/uL (ref 0.1–1.0)
Monocytes Relative: 6 %
Neutro Abs: 5.4 10*3/uL (ref 1.7–7.7)
Neutrophils Relative %: 62 %
Platelets: 243 10*3/uL (ref 150–400)
RBC: 5.28 MIL/uL (ref 4.22–5.81)
RDW: 12.3 % (ref 11.5–15.5)
WBC: 8.9 10*3/uL (ref 4.0–10.5)
nRBC: 0 % (ref 0.0–0.2)

## 2022-02-26 LAB — HEMOGLOBIN A1C
Hgb A1c MFr Bld: 5.6 % (ref 4.8–5.6)
Mean Plasma Glucose: 114.02 mg/dL

## 2022-02-26 LAB — GLUCOSE, CAPILLARY: Glucose-Capillary: 122 mg/dL — ABNORMAL HIGH (ref 70–99)

## 2022-02-26 NOTE — Progress Notes (Signed)
Anesthesia note:  Bowel prep reminder:NA  PCP - Dr. Truett Mainland Cardiologist -none Other-   Chest x-ray - 12/18/18 EKG - 10/05/21-epic Stress Test - 10/24/21-epic ECHO - no Cardiac Cath - na  Pacemaker/ICD device last checked:NA  Sleep Study - no CPAP -    Fasting Blood Sugar - 95-110 Checks Blood Sugar __three times a week___  Blood Thinner:NA Blood Thinner Instructions: Aspirin Instructions: Last Dose:  Anesthesia review:no  Pt has sore knees and hips . He moves slowly and reports no SOB with his level of activites. His Asthma and COPB are well controlled now.  Patient denies shortness of breath, fever, cough and chest pain at PAT appointment   Patient verbalized understanding of instructions that were given to them at the PAT appointment. Patient was also instructed that they will need to review over the PAT instructions again at home before surgery. yes

## 2022-02-27 DIAGNOSIS — M546 Pain in thoracic spine: Secondary | ICD-10-CM | POA: Diagnosis not present

## 2022-02-27 DIAGNOSIS — I10 Essential (primary) hypertension: Secondary | ICD-10-CM | POA: Diagnosis not present

## 2022-02-27 DIAGNOSIS — E118 Type 2 diabetes mellitus with unspecified complications: Secondary | ICD-10-CM | POA: Diagnosis not present

## 2022-03-05 ENCOUNTER — Encounter (HOSPITAL_COMMUNITY): Payer: Self-pay | Admitting: General Surgery

## 2022-03-05 MED ORDER — DEXTROSE 5 % IV SOLN
5.0000 mg/kg | INTRAVENOUS | Status: AC
Start: 2022-03-06 — End: 2022-03-06
  Administered 2022-03-06: 461.2 mg via INTRAVENOUS
  Filled 2022-03-05: qty 11.5

## 2022-03-05 MED ORDER — CLINDAMYCIN PHOSPHATE 900 MG/50ML IV SOLN
900.0000 mg | INTRAVENOUS | Status: AC
Start: 2022-03-06 — End: 2022-03-06
  Administered 2022-03-06: 900 mg via INTRAVENOUS
  Filled 2022-03-05: qty 50

## 2022-03-05 NOTE — Anesthesia Preprocedure Evaluation (Addendum)
Anesthesia Evaluation  Patient identified by MRN, date of birth, ID band Patient awake    Reviewed: Allergy & Precautions, NPO status , Patient's Chart, lab work & pertinent test results  Airway Mallampati: II  TM Distance: >3 FB Neck ROM: Full    Dental  (+) Teeth Intact, Dental Advisory Given   Pulmonary asthma , COPD,  COPD inhaler, former smoker,    breath sounds clear to auscultation       Cardiovascular hypertension, Pt. on medications  Rhythm:Regular Rate:Normal  Myocardial Perfusion Study The study is normal. The study is low risk. .  No ST deviation was noted. .  LV perfusion is normal. There is no evidence of ischemia. There is no evidence of infarction. .  Left ventricular function is normal. Nuclear stress EF: 59 %. The left ventricular ejection fraction is normal (55-65%). End diastolic cavity size is normal. End systolic cavity size is normal. .  Prior study not available for comparison.    Neuro/Psych negative neurological ROS  negative psych ROS   GI/Hepatic Neg liver ROS, GERD  Medicated,  Endo/Other  diabetes  Renal/GU negative Renal ROS     Musculoskeletal   Abdominal Normal abdominal exam  (+)   Peds  Hematology   Anesthesia Other Findings   Reproductive/Obstetrics                           Anesthesia Physical Anesthesia Plan  ASA: 3  Anesthesia Plan: General   Post-op Pain Management:    Induction: Intravenous  PONV Risk Score and Plan: 3 and Ondansetron, Dexamethasone and Midazolam  Airway Management Planned: Oral ETT  Additional Equipment: None  Intra-op Plan:   Post-operative Plan: Extubation in OR  Informed Consent: I have reviewed the patients History and Physical, chart, labs and discussed the procedure including the risks, benefits and alternatives for the proposed anesthesia with the patient or authorized representative who has indicated his/her  understanding and acceptance.     Dental advisory given  Plan Discussed with: CRNA  Anesthesia Plan Comments:        Anesthesia Quick Evaluation

## 2022-03-06 ENCOUNTER — Other Ambulatory Visit: Payer: Self-pay

## 2022-03-06 ENCOUNTER — Encounter (HOSPITAL_COMMUNITY): Payer: Self-pay | Admitting: General Surgery

## 2022-03-06 ENCOUNTER — Telehealth (HOSPITAL_COMMUNITY): Payer: Self-pay | Admitting: Pharmacy Technician

## 2022-03-06 ENCOUNTER — Encounter (HOSPITAL_COMMUNITY)
Admission: RE | Disposition: A | Discharge status: Home / Self Care | Payer: Self-pay | Source: Ambulatory Visit | Attending: General Surgery

## 2022-03-06 ENCOUNTER — Inpatient Hospital Stay (HOSPITAL_COMMUNITY): Payer: Medicare Other | Admitting: Anesthesiology

## 2022-03-06 ENCOUNTER — Other Ambulatory Visit (HOSPITAL_COMMUNITY): Payer: Self-pay

## 2022-03-06 ENCOUNTER — Inpatient Hospital Stay (HOSPITAL_COMMUNITY)
Admission: RE | Admit: 2022-03-06 | Discharge: 2022-03-07 | DRG: 621 | Disposition: A | Discharge status: Home / Self Care | Payer: Medicare Other | Source: Ambulatory Visit | Attending: General Surgery | Admitting: General Surgery

## 2022-03-06 DIAGNOSIS — Z6841 Body Mass Index (BMI) 40.0 and over, adult: Secondary | ICD-10-CM | POA: Diagnosis not present

## 2022-03-06 DIAGNOSIS — E785 Hyperlipidemia, unspecified: Secondary | ICD-10-CM | POA: Diagnosis present

## 2022-03-06 DIAGNOSIS — I10 Essential (primary) hypertension: Secondary | ICD-10-CM

## 2022-03-06 DIAGNOSIS — K432 Incisional hernia without obstruction or gangrene: Secondary | ICD-10-CM | POA: Diagnosis present

## 2022-03-06 DIAGNOSIS — Z87891 Personal history of nicotine dependence: Secondary | ICD-10-CM

## 2022-03-06 DIAGNOSIS — Z8249 Family history of ischemic heart disease and other diseases of the circulatory system: Secondary | ICD-10-CM | POA: Diagnosis not present

## 2022-03-06 DIAGNOSIS — Z833 Family history of diabetes mellitus: Secondary | ICD-10-CM

## 2022-03-06 DIAGNOSIS — J449 Chronic obstructive pulmonary disease, unspecified: Secondary | ICD-10-CM | POA: Diagnosis present

## 2022-03-06 DIAGNOSIS — E669 Obesity, unspecified: Secondary | ICD-10-CM | POA: Diagnosis not present

## 2022-03-06 DIAGNOSIS — E538 Deficiency of other specified B group vitamins: Secondary | ICD-10-CM | POA: Diagnosis present

## 2022-03-06 DIAGNOSIS — E559 Vitamin D deficiency, unspecified: Secondary | ICD-10-CM | POA: Diagnosis present

## 2022-03-06 DIAGNOSIS — Z903 Acquired absence of stomach [part of]: Secondary | ICD-10-CM

## 2022-03-06 DIAGNOSIS — Z823 Family history of stroke: Secondary | ICD-10-CM | POA: Diagnosis not present

## 2022-03-06 DIAGNOSIS — Z803 Family history of malignant neoplasm of breast: Secondary | ICD-10-CM | POA: Diagnosis not present

## 2022-03-06 DIAGNOSIS — E1140 Type 2 diabetes mellitus with diabetic neuropathy, unspecified: Principal | ICD-10-CM

## 2022-03-06 DIAGNOSIS — J45909 Unspecified asthma, uncomplicated: Secondary | ICD-10-CM | POA: Diagnosis present

## 2022-03-06 DIAGNOSIS — E119 Type 2 diabetes mellitus without complications: Secondary | ICD-10-CM | POA: Diagnosis present

## 2022-03-06 DIAGNOSIS — Z9884 Bariatric surgery status: Secondary | ICD-10-CM

## 2022-03-06 DIAGNOSIS — Z6837 Body mass index (BMI) 37.0-37.9, adult: Secondary | ICD-10-CM | POA: Diagnosis not present

## 2022-03-06 HISTORY — PX: LAPAROSCOPIC GASTRIC SLEEVE RESECTION: SHX5895

## 2022-03-06 HISTORY — PX: UPPER GI ENDOSCOPY: SHX6162

## 2022-03-06 HISTORY — DX: Acquired absence of stomach (part of): Z90.3

## 2022-03-06 LAB — GLUCOSE, CAPILLARY
Glucose-Capillary: 126 mg/dL — ABNORMAL HIGH (ref 70–99)
Glucose-Capillary: 143 mg/dL — ABNORMAL HIGH (ref 70–99)
Glucose-Capillary: 150 mg/dL — ABNORMAL HIGH (ref 70–99)
Glucose-Capillary: 164 mg/dL — ABNORMAL HIGH (ref 70–99)
Glucose-Capillary: 170 mg/dL — ABNORMAL HIGH (ref 70–99)
Glucose-Capillary: 93 mg/dL (ref 70–99)

## 2022-03-06 LAB — TYPE AND SCREEN
ABO/RH(D): O POS
Antibody Screen: NEGATIVE

## 2022-03-06 LAB — ABO/RH: ABO/RH(D): O POS

## 2022-03-06 LAB — HEMOGLOBIN AND HEMATOCRIT, BLOOD
HCT: 44.1 % (ref 39.0–52.0)
Hemoglobin: 14.4 g/dL (ref 13.0–17.0)

## 2022-03-06 SURGERY — GASTRECTOMY, SLEEVE, LAPAROSCOPIC
Anesthesia: General

## 2022-03-06 MED ORDER — POTASSIUM CHLORIDE IN NACL 20-0.45 MEQ/L-% IV SOLN
INTRAVENOUS | Status: DC
  Filled 2022-03-06 (×3): qty 1000

## 2022-03-06 MED ORDER — CHLORHEXIDINE GLUCONATE 4 % EX LIQD: Freq: Once | CUTANEOUS | Status: DC

## 2022-03-06 MED ORDER — OXYCODONE HCL 5 MG PO TABS: 5.0000 mg | ORAL_TABLET | Freq: Once | ORAL | Status: DC | PRN

## 2022-03-06 MED ORDER — ROCURONIUM BROMIDE 100 MG/10ML IV SOLN
INTRAVENOUS | Status: DC | PRN
  Administered 2022-03-06: 20 mg via INTRAVENOUS
  Administered 2022-03-06: 60 mg via INTRAVENOUS

## 2022-03-06 MED ORDER — LIDOCAINE HCL (PF) 2 % IJ SOLN
INTRAMUSCULAR | Status: DC | PRN
  Administered 2022-03-06: 1.5 mg/kg/h via INTRADERMAL

## 2022-03-06 MED ORDER — APREPITANT 40 MG PO CAPS
40.0000 mg | ORAL_CAPSULE | ORAL | Status: AC
  Administered 2022-03-06: 40 mg via ORAL
  Filled 2022-03-06: qty 1

## 2022-03-06 MED ORDER — ONDANSETRON HCL 4 MG/2ML IJ SOLN
INTRAMUSCULAR | Status: DC | PRN
  Administered 2022-03-06: 4 mg via INTRAVENOUS

## 2022-03-06 MED ORDER — PHENYLEPHRINE 80 MCG/ML (10ML) SYRINGE FOR IV PUSH (FOR BLOOD PRESSURE SUPPORT)
PREFILLED_SYRINGE | INTRAVENOUS | Status: AC
  Filled 2022-03-06: qty 20

## 2022-03-06 MED ORDER — KETAMINE HCL 10 MG/ML IJ SOLN
INTRAMUSCULAR | Status: DC | PRN
  Administered 2022-03-06: 20 mg via INTRAVENOUS

## 2022-03-06 MED ORDER — OXYCODONE HCL 5 MG/5ML PO SOLN
5.0000 mg | Freq: Four times a day (QID) | ORAL | Status: DC | PRN
  Administered 2022-03-06 (×2): 5 mg via ORAL
  Filled 2022-03-06 (×2): qty 5

## 2022-03-06 MED ORDER — LIDOCAINE HCL (CARDIAC) PF 100 MG/5ML IV SOSY
PREFILLED_SYRINGE | INTRAVENOUS | Status: DC | PRN
  Administered 2022-03-06: 60 mg via INTRAVENOUS

## 2022-03-06 MED ORDER — ACETAMINOPHEN 10 MG/ML IV SOLN: 1000.0000 mg | Freq: Once | INTRAVENOUS | Status: DC | PRN

## 2022-03-06 MED ORDER — AMLODIPINE BESYLATE 5 MG PO TABS
5.0000 mg | ORAL_TABLET | Freq: Every day | ORAL | Status: DC
  Administered 2022-03-07: 5 mg via ORAL
  Filled 2022-03-06: qty 1

## 2022-03-06 MED ORDER — ROCURONIUM BROMIDE 10 MG/ML (PF) SYRINGE
PREFILLED_SYRINGE | INTRAVENOUS | Status: AC
  Filled 2022-03-06: qty 10

## 2022-03-06 MED ORDER — FENTANYL CITRATE (PF) 250 MCG/5ML IJ SOLN
INTRAMUSCULAR | Status: AC
  Filled 2022-03-06: qty 5

## 2022-03-06 MED ORDER — HYDRALAZINE HCL 20 MG/ML IJ SOLN: 10.0000 mg | INTRAMUSCULAR | Status: DC | PRN

## 2022-03-06 MED ORDER — LACTATED RINGERS IV SOLN: INTRAVENOUS | Status: DC

## 2022-03-06 MED ORDER — SODIUM CHLORIDE (PF) 0.9 % IJ SOLN
INTRAMUSCULAR | Status: DC | PRN
  Administered 2022-03-06: 50 mL via INTRAVENOUS

## 2022-03-06 MED ORDER — AMISULPRIDE (ANTIEMETIC) 5 MG/2ML IV SOLN: 10.0000 mg | Freq: Once | INTRAVENOUS | Status: DC | PRN

## 2022-03-06 MED ORDER — PROMETHAZINE HCL 25 MG/ML IJ SOLN: 6.2500 mg | INTRAMUSCULAR | Status: DC | PRN

## 2022-03-06 MED ORDER — IPRATROPIUM-ALBUTEROL 0.5-2.5 (3) MG/3ML IN SOLN: 3.0000 mL | Freq: Four times a day (QID) | RESPIRATORY_TRACT | Status: DC | PRN

## 2022-03-06 MED ORDER — SUGAMMADEX SODIUM 500 MG/5ML IV SOLN
INTRAVENOUS | Status: AC
  Filled 2022-03-06: qty 5

## 2022-03-06 MED ORDER — ONDANSETRON HCL 4 MG/2ML IJ SOLN
INTRAMUSCULAR | Status: AC
  Filled 2022-03-06: qty 2

## 2022-03-06 MED ORDER — PROPOFOL 10 MG/ML IV BOLUS
INTRAVENOUS | Status: DC | PRN
  Administered 2022-03-06: 140 mg via INTRAVENOUS

## 2022-03-06 MED ORDER — ALBUTEROL SULFATE (2.5 MG/3ML) 0.083% IN NEBU
2.5000 mg | INHALATION_SOLUTION | Freq: Four times a day (QID) | RESPIRATORY_TRACT | Status: DC | PRN
Start: 2022-03-06 — End: 2022-03-07

## 2022-03-06 MED ORDER — KETAMINE HCL 10 MG/ML IJ SOLN
INTRAMUSCULAR | Status: AC
Start: 2022-03-06 — End: ?
  Filled 2022-03-06: qty 1

## 2022-03-06 MED ORDER — BUPIVACAINE LIPOSOME 1.3 % IJ SUSP
INTRAMUSCULAR | Status: AC
  Filled 2022-03-06: qty 20

## 2022-03-06 MED ORDER — INSULIN ASPART 100 UNIT/ML IJ SOLN
0.0000 [IU] | INTRAMUSCULAR | Status: DC
  Administered 2022-03-06: 3 [IU] via SUBCUTANEOUS
  Administered 2022-03-06 (×2): 4 [IU] via SUBCUTANEOUS
  Administered 2022-03-06: 3 [IU] via SUBCUTANEOUS

## 2022-03-06 MED ORDER — ORAL CARE MOUTH RINSE: 15.0000 mL | Freq: Once | OROMUCOSAL | Status: AC

## 2022-03-06 MED ORDER — MORPHINE SULFATE (PF) 2 MG/ML IV SOLN
1.0000 mg | INTRAVENOUS | Status: DC | PRN
  Administered 2022-03-06: 2 mg via INTRAVENOUS
  Filled 2022-03-06: qty 1

## 2022-03-06 MED ORDER — HEPARIN SODIUM (PORCINE) 5000 UNIT/ML IJ SOLN
5000.0000 [IU] | INTRAMUSCULAR | Status: AC
  Administered 2022-03-06: 5000 [IU] via SUBCUTANEOUS
  Filled 2022-03-06: qty 1

## 2022-03-06 MED ORDER — ACETAMINOPHEN 325 MG PO TABS: 325.0000 mg | ORAL_TABLET | ORAL | Status: DC | PRN

## 2022-03-06 MED ORDER — ACETAMINOPHEN 160 MG/5ML PO SOLN
1000.0000 mg | Freq: Three times a day (TID) | ORAL | Status: DC
  Filled 2022-03-06: qty 40.6

## 2022-03-06 MED ORDER — PROPOFOL 10 MG/ML IV BOLUS
INTRAVENOUS | Status: AC
  Filled 2022-03-06: qty 20

## 2022-03-06 MED ORDER — FENTANYL CITRATE PF 50 MCG/ML IJ SOSY
25.0000 ug | PREFILLED_SYRINGE | INTRAMUSCULAR | Status: DC | PRN
  Administered 2022-03-06 (×3): 25 ug via INTRAVENOUS

## 2022-03-06 MED ORDER — FENTANYL CITRATE PF 50 MCG/ML IJ SOSY
PREFILLED_SYRINGE | INTRAMUSCULAR | Status: AC
  Filled 2022-03-06: qty 1

## 2022-03-06 MED ORDER — ENOXAPARIN SODIUM 30 MG/0.3ML IJ SOSY
30.0000 mg | PREFILLED_SYRINGE | Freq: Two times a day (BID) | INTRAMUSCULAR | Status: DC
  Administered 2022-03-06 – 2022-03-07 (×2): 30 mg via SUBCUTANEOUS
  Filled 2022-03-06 (×2): qty 0.3

## 2022-03-06 MED ORDER — BUPIVACAINE LIPOSOME 1.3 % IJ SUSP: 20.0000 mL | Freq: Once | INTRAMUSCULAR | Status: DC

## 2022-03-06 MED ORDER — LIDOCAINE HCL (PF) 2 % IJ SOLN
INTRAMUSCULAR | Status: AC
  Filled 2022-03-06: qty 15

## 2022-03-06 MED ORDER — SODIUM CHLORIDE (PF) 0.9 % IJ SOLN
INTRAMUSCULAR | Status: AC
Start: 2022-03-06 — End: ?
  Filled 2022-03-06: qty 50

## 2022-03-06 MED ORDER — FENTANYL CITRATE (PF) 100 MCG/2ML IJ SOLN
INTRAMUSCULAR | Status: DC | PRN
  Administered 2022-03-06: 100 ug via INTRAVENOUS
  Administered 2022-03-06 (×3): 50 ug via INTRAVENOUS

## 2022-03-06 MED ORDER — PANTOPRAZOLE SODIUM 40 MG IV SOLR
40.0000 mg | Freq: Every day | INTRAVENOUS | Status: DC
  Administered 2022-03-06: 40 mg via INTRAVENOUS
  Filled 2022-03-06: qty 10

## 2022-03-06 MED ORDER — ENSURE MAX PROTEIN PO LIQD
2.0000 [oz_av] | ORAL | Status: DC
  Administered 2022-03-07 (×4): 2 [oz_av] via ORAL

## 2022-03-06 MED ORDER — SIMETHICONE 80 MG PO CHEW
80.0000 mg | CHEWABLE_TABLET | Freq: Four times a day (QID) | ORAL | Status: DC | PRN
  Administered 2022-03-06: 80 mg via ORAL
  Filled 2022-03-06: qty 1

## 2022-03-06 MED ORDER — DEXAMETHASONE SODIUM PHOSPHATE 10 MG/ML IJ SOLN
INTRAMUSCULAR | Status: DC | PRN
  Administered 2022-03-06: 8 mg via INTRAVENOUS

## 2022-03-06 MED ORDER — MIDAZOLAM HCL 2 MG/2ML IJ SOLN
INTRAMUSCULAR | Status: AC
  Filled 2022-03-06: qty 2

## 2022-03-06 MED ORDER — ACETAMINOPHEN 500 MG PO TABS
1000.0000 mg | ORAL_TABLET | ORAL | Status: AC
  Administered 2022-03-06: 1000 mg via ORAL
  Filled 2022-03-06: qty 2

## 2022-03-06 MED ORDER — ONDANSETRON HCL 4 MG/2ML IJ SOLN
4.0000 mg | Freq: Four times a day (QID) | INTRAMUSCULAR | Status: DC | PRN
  Administered 2022-03-06 – 2022-03-07 (×2): 4 mg via INTRAVENOUS
  Filled 2022-03-06 (×2): qty 2

## 2022-03-06 MED ORDER — PHENYLEPHRINE HCL (PRESSORS) 10 MG/ML IV SOLN
INTRAVENOUS | Status: DC | PRN
  Administered 2022-03-06 (×5): 160 ug via INTRAVENOUS
  Administered 2022-03-06: 80 ug via INTRAVENOUS
  Administered 2022-03-06: 160 ug via INTRAVENOUS

## 2022-03-06 MED ORDER — BUPIVACAINE LIPOSOME 1.3 % IJ SUSP
INTRAMUSCULAR | Status: DC | PRN
  Administered 2022-03-06: 20 mL

## 2022-03-06 MED ORDER — CHLORHEXIDINE GLUCONATE 0.12 % MT SOLN
15.0000 mL | Freq: Once | OROMUCOSAL | Status: AC
  Administered 2022-03-06: 15 mL via OROMUCOSAL

## 2022-03-06 MED ORDER — LACTATED RINGERS IR SOLN
Status: DC | PRN
  Administered 2022-03-06: 3000 mL

## 2022-03-06 MED ORDER — OXYCODONE HCL 5 MG/5ML PO SOLN: 5.0000 mg | Freq: Once | ORAL | Status: DC | PRN

## 2022-03-06 MED ORDER — MIDAZOLAM HCL 5 MG/5ML IJ SOLN
INTRAMUSCULAR | Status: DC | PRN
  Administered 2022-03-06: 1 mg via INTRAVENOUS

## 2022-03-06 MED ORDER — SCOPOLAMINE 1 MG/3DAYS TD PT72
1.0000 | MEDICATED_PATCH | TRANSDERMAL | Status: DC
  Administered 2022-03-06: 1.5 mg via TRANSDERMAL
  Filled 2022-03-06: qty 1

## 2022-03-06 MED ORDER — DEXAMETHASONE SODIUM PHOSPHATE 4 MG/ML IJ SOLN: 4.0000 mg | INTRAMUSCULAR | Status: DC

## 2022-03-06 MED ORDER — PHENYLEPHRINE HCL-NACL 20-0.9 MG/250ML-% IV SOLN
INTRAVENOUS | Status: DC | PRN
  Administered 2022-03-06: 50 ug/min via INTRAVENOUS

## 2022-03-06 MED ORDER — PHENYLEPHRINE HCL-NACL 20-0.9 MG/250ML-% IV SOLN
INTRAVENOUS | Status: AC
  Filled 2022-03-06: qty 250

## 2022-03-06 MED ORDER — ACETAMINOPHEN 500 MG PO TABS
1000.0000 mg | ORAL_TABLET | Freq: Three times a day (TID) | ORAL | Status: DC
  Administered 2022-03-06 – 2022-03-07 (×3): 1000 mg via ORAL
  Filled 2022-03-06 (×3): qty 2

## 2022-03-06 MED ORDER — SUGAMMADEX SODIUM 500 MG/5ML IV SOLN
INTRAVENOUS | Status: DC | PRN
  Administered 2022-03-06: 500 mg via INTRAVENOUS

## 2022-03-06 MED ORDER — ACETAMINOPHEN 160 MG/5ML PO SOLN: 325.0000 mg | ORAL | Status: DC | PRN

## 2022-03-06 MED ORDER — DEXAMETHASONE SODIUM PHOSPHATE 10 MG/ML IJ SOLN
INTRAMUSCULAR | Status: AC
Start: 2022-03-06 — End: ?
  Filled 2022-03-06: qty 1

## 2022-03-06 SURGICAL SUPPLY — 77 items
APPLICATOR COTTON TIP 6 STRL (MISCELLANEOUS) IMPLANT
APPLICATOR COTTON TIP 6IN STRL (MISCELLANEOUS)
APPLIER CLIP ROT 10 11.4 M/L (STAPLE)
APPLIER CLIP ROT 13.4 12 LRG (CLIP)
BAG COUNTER SPONGE SURGICOUNT (BAG) IMPLANT
BLADE SURG SZ11 CARB STEEL (BLADE) ×1 IMPLANT
CABLE HIGH FREQUENCY MONO STRZ (ELECTRODE) IMPLANT
CHLORAPREP W/TINT 26 (MISCELLANEOUS) ×2 IMPLANT
CLIP APPLIE ROT 10 11.4 M/L (STAPLE) IMPLANT
CLIP APPLIE ROT 13.4 12 LRG (CLIP) IMPLANT
COVER SURGICAL LIGHT HANDLE (MISCELLANEOUS) ×1 IMPLANT
DEVICE SUT QUICK LOAD TK 5 (SUTURE) IMPLANT
DEVICE SUT TI-KNOT TK 5X26 (SUTURE) IMPLANT
DEVICE SUTURE ENDOST 10MM (ENDOMECHANICALS) IMPLANT
DISSECTOR BLUNT TIP ENDO 5MM (MISCELLANEOUS) IMPLANT
DRAPE UTILITY XL STRL (DRAPES) ×2 IMPLANT
DRSG TEGADERM 2-3/8X2-3/4 SM (GAUZE/BANDAGES/DRESSINGS) ×6 IMPLANT
ELECT L-HOOK LAP 45CM DISP (ELECTROSURGICAL)
ELECT REM PT RETURN 15FT ADLT (MISCELLANEOUS) ×1 IMPLANT
ELECTRODE L-HOOK LAP 45CM DISP (ELECTROSURGICAL) IMPLANT
GAUZE SPONGE 2X2 8PLY STRL LF (GAUZE/BANDAGES/DRESSINGS) IMPLANT
GAUZE SPONGE 4X4 12PLY STRL (GAUZE/BANDAGES/DRESSINGS) IMPLANT
GLOVE BIO SURGEON STRL SZ7.5 (GLOVE) ×1 IMPLANT
GLOVE INDICATOR 8.0 STRL GRN (GLOVE) ×1 IMPLANT
GOWN STRL REUS W/ TWL XL LVL3 (GOWN DISPOSABLE) ×3 IMPLANT
GOWN STRL REUS W/TWL XL LVL3 (GOWN DISPOSABLE) ×3
GRASPER SUT TROCAR 14GX15 (MISCELLANEOUS) ×1 IMPLANT
HEMOSTAT SNOW SURGICEL 2X4 (HEMOSTASIS) IMPLANT
IRRIG SUCT STRYKERFLOW 2 WTIP (MISCELLANEOUS) ×1
IRRIGATION SUCT STRKRFLW 2 WTP (MISCELLANEOUS) ×1 IMPLANT
KIT BASIN OR (CUSTOM PROCEDURE TRAY) ×1 IMPLANT
KIT TURNOVER KIT A (KITS) IMPLANT
MARKER SKIN DUAL TIP RULER LAB (MISCELLANEOUS) ×1 IMPLANT
MAT PREVALON FULL STRYKER (MISCELLANEOUS) ×1 IMPLANT
NDL SPNL 22GX3.5 QUINCKE BK (NEEDLE) ×1 IMPLANT
NEEDLE SPNL 22GX3.5 QUINCKE BK (NEEDLE) ×1 IMPLANT
PACK UNIVERSAL I (CUSTOM PROCEDURE TRAY) ×1 IMPLANT
PENCIL SMOKE EVACUATOR (MISCELLANEOUS) IMPLANT
RELOAD STAPLE 60 3.6 BLU REG (STAPLE) ×1 IMPLANT
RELOAD STAPLE 60 3.8 GOLD REG (STAPLE) IMPLANT
RELOAD STAPLE 60 4.1 GRN THCK (STAPLE) ×1 IMPLANT
RELOAD STAPLE 60 BLK VRY/THCK (STAPLE) IMPLANT
RELOAD STAPLER 60MM BLK (STAPLE) IMPLANT
RELOAD STAPLER BLUE 60MM (STAPLE) ×5 IMPLANT
RELOAD STAPLER GOLD 60MM (STAPLE) ×1 IMPLANT
RELOAD STAPLER GREEN 60MM (STAPLE) ×1 IMPLANT
SCISSORS LAP 5X45 EPIX DISP (ENDOMECHANICALS) IMPLANT
SEALANT SURGICAL APPL DUAL CAN (MISCELLANEOUS) IMPLANT
SET TUBE SMOKE EVAC HIGH FLOW (TUBING) ×1 IMPLANT
SHEARS HARMONIC ACE PLUS 45CM (MISCELLANEOUS) ×1 IMPLANT
SLEEVE ADV FIXATION 5X100MM (TROCAR) ×2 IMPLANT
SLEEVE GASTRECTOMY 40FR VISIGI (MISCELLANEOUS) ×1 IMPLANT
SOL ANTI FOG 6CC (MISCELLANEOUS) ×1 IMPLANT
SOLUTION ANTI FOG 6CC (MISCELLANEOUS) ×1
SPIKE FLUID TRANSFER (MISCELLANEOUS) ×1 IMPLANT
STAPLE LINE REINFORCEMENT LAP (STAPLE) IMPLANT
STAPLER ECHELON BIOABSB 60 FLE (MISCELLANEOUS) IMPLANT
STAPLER ECHELON LONG 60 440 (INSTRUMENTS) ×1 IMPLANT
STAPLER RELOAD 60MM BLK (STAPLE)
STAPLER RELOAD BLUE 60MM (STAPLE) ×5
STAPLER RELOAD GOLD 60MM (STAPLE) ×1
STAPLER RELOAD GREEN 60MM (STAPLE) ×1
STRIP CLOSURE SKIN 1/2X4 (GAUZE/BANDAGES/DRESSINGS) ×1 IMPLANT
SUT MNCRL AB 4-0 PS2 18 (SUTURE) ×1 IMPLANT
SUT SURGIDAC NAB ES-9 0 48 120 (SUTURE) IMPLANT
SUT VICRYL 0 TIES 12 18 (SUTURE) ×1 IMPLANT
SYR 20ML LL LF (SYRINGE) ×1 IMPLANT
SYR 50ML LL SCALE MARK (SYRINGE) ×1 IMPLANT
SYS KII OPTICAL ACCESS 15MM (TROCAR) ×1
SYSTEM KII OPTICAL ACCESS 15MM (TROCAR) ×1 IMPLANT
TOWEL OR 17X26 10 PK STRL BLUE (TOWEL DISPOSABLE) ×1 IMPLANT
TOWEL OR NON WOVEN STRL DISP B (DISPOSABLE) ×1 IMPLANT
TROCAR ADV FIXATION 5X100MM (TROCAR) ×1 IMPLANT
TROCAR XCEL NON-BLD 5MMX100MML (ENDOMECHANICALS) IMPLANT
TROCAR Z-THREAD OPTICAL 5X100M (TROCAR) ×1 IMPLANT
TUBING CONNECTING 10 (TUBING) ×2 IMPLANT
TUBING ENDO SMARTCAP (MISCELLANEOUS) ×1 IMPLANT

## 2022-03-06 NOTE — H&P (Signed)
PROVIDER: Darol Cush Leanne Chang, MD  MRN: [id number] DOB: Aug 01, 1945 DATE OF ENCOUNTER: 02/08/2022 Subjective   Chief Complaint: Pre-op Exam (Return weight loss )   History of Present Illness: Travis Blanchard is a 76 y.o. male who is seen today for long-term follow-up regarding his severe obesity and related comorbidities which include hypertension, history of dyslipidemia, bilateral knee osteoarthritis, right hip osteoarthritis, diabetes mellitus as well as an incisional hernia. I last saw him in june 2023. I initially met him in 2020 to discuss bariatric surgery. He had been working with the health systems obesity medicine program for over 6 months. Prior to that he was doing obesity medicine visits with [facility] in 2019. The past he has also tried weight watchers, Atkins as well as the above-mentioned physician supervised weight loss programs-all without long-term success..  His primary motivation for bariatric surgery is to lose weight to help him have incisional hernia repair as well as hip replacement.  He denies any trips to the emergency room or hospital. He denies any nausea, vomiting, diarrhea or constipation. He denies any abdominal pain. He denies any heartburn or indigestion. He denies any chest pain, chest pressure, shortness of breath, dyspnea on exertion.  Since my last visit with him in june he was reevaluated by pyschology and now has received psychological clearance.   Previously saw cardiology Dr Percival Spanish and underwent a myocardial perfusion scan and had a low risk study.  He has had an additional nutritional evaluation and has been cleared by nutrition.   He had labs at his PCPs office in January 2023 which I previously reviewed. His lipid panel was within normal limits. Total cholesterol 141, HDL 50, triglycerides 56, LDL 77 Basic metabolic panel within normal limits. Creatinine 0.77, GFR 95 Hemoglobin A1c 6.1 LFTs normal CBC within normal limits hemoglobin 14, hematocrit 43,  platelet count 205 Review of Systems: A complete review of systems was obtained from the patient. I have reviewed this information and discussed as appropriate with the patient. See HPI as well for other ROS.  ROS   Medical History: Past Medical History:  Diagnosis Date  Arthritis  Asthma, unspecified asthma severity, unspecified whether complicated, unspecified whether persistent  COPD (chronic obstructive pulmonary disease) (CMS-HCC)  Diabetes mellitus without complication (CMS-HCC)  History of cancer  Hypertension   Patient Active Problem List  Diagnosis  Essential hypertension  History of Helicobacter pylori infection  Osteoarthritis  Class 2 severe obesity due to excess calories with serious comorbidity in adult (CMS-HCC)  Diabetes mellitus type 2 in obese (CMS-HCC)   Past Surgical History:  Procedure Laterality Date  prostate removal  right knee orthoscopic    Allergies  Allergen Reactions  Lisinopril Swelling  Facial swelling  Penicillins Anaphylaxis, Hives, Itching, Other (See Comments) and Shortness Of Breath  Other reaction(s): Other (See Comments) Has patient had a PCN reaction causing immediate rash, facial/tongue/throat swelling, SOB or lightheadedness with hypotension: Yes Has patient had a PCN reaction causing severe rash involving mucus membranes or skin necrosis: No Has patient had a PCN reaction that required hospitalization: No Has patient had a PCN reaction occurring within the last 10 years: No If all of the above answers are "NO", then may proceed with Cephalosporin use. Has patient had a PCN reaction causing immediate rash, facial/tongue/throat swelling, SOB or lightheadedness with hypotension: Yes Has patient had a PCN reaction causing severe rash involving mucus membranes or skin necrosis: No Has patient had a PCN reaction that required hospitalization: No Has patient had a  PCN reaction occurring within the last 10 years: No If all of the above  answers are "NO", then may proceed with Cephalosporin use.  Other reaction(s): Other (See Comments) Has patient had a PCN reaction causing immediate rash, facial/tongue/throat swelling, SOB or lightheadedness with hypotension: Yes Has patient had a PCN reaction causing severe rash involving mucus membranes or skin necrosis: No Has patient had a PCN reaction that required hospitalization: No Has patient had a PCN reaction occurring within the last 10 years: No If all of the above answers are "NO", then may proceed with Cephalosporin use.   Current Outpatient Medications on File Prior to Visit  Medication Sig Dispense Refill  acetaminophen (TYLENOL) 500 MG tablet Take 1,000 mg by mouth every 6 (six) hours as needed  albuterol 90 mcg/actuation inhaler  amLODIPine (NORVASC) 5 MG tablet  atorvastatin (LIPITOR) 20 MG tablet Take 20 mg by mouth once daily  cholecalciferol (VITAMIN D3) 1000 unit capsule Take by mouth  cyanocobalamin (VITAMIN B12) 1000 MCG tablet Take by mouth  fluticasone propionate (FLONASE) 50 mcg/actuation nasal spray 2 PUFFS EVERY DAY AS DIRECTED  ipratropium-albuteroL (DUO-NEB) nebulizer solution Inhale into the lungs  loratadine (CLARITIN) 10 mg tablet Take 10 mg by mouth once daily  metFORMIN (GLUCOPHAGE) 500 MG tablet Take 500 mg by mouth 2 (two) times daily  naproxen sodium (ALEVE) 220 MG tablet Take by mouth  omeprazole (PRILOSEC) 20 MG DR capsule Take by mouth  psyllium, aspartame, (METAMUCIL) 3.4 gram packet Take by mouth  zolpidem (AMBIEN) 10 mg tablet Take 10 mg by mouth at bedtime   No current facility-administered medications on file prior to visit.   Family History  Problem Relation Age of Onset  Stroke Mother  Obesity Mother  High blood pressure (Hypertension) Mother  Hyperlipidemia (Elevated cholesterol) Mother  Coronary Artery Disease (Blocked arteries around heart) Mother  Diabetes Father  Obesity Sister  High blood pressure (Hypertension) Sister   Diabetes Sister  Breast cancer Sister  Obesity Brother  High blood pressure (Hypertension) Brother    Social History   Tobacco Use  Smoking Status Former  Types: Cigarettes  Smokeless Tobacco Never    Social History   Socioeconomic History  Marital status: Married  Tobacco Use  Smoking status: Former  Types: Cigarettes  Smokeless tobacco: Never  Substance and Sexual Activity  Alcohol use: Yes  Drug use: Yes  Types: Marijuana   Objective:   Vitals:  02/08/22 0841  BP: (!) 140/70  Pulse: 90  Temp: 36.7 C (98.1 F)  SpO2: 98%  Weight: (!) 126.9 kg (279 lb 12.8 oz)  Height: 177.8 cm (_0 )   Body mass index is 40.15 kg/m.  Gen: alert, NAD, non-toxic appearing Pupils: equal, no scleral icterus Pulm: Lungs clear to auscultation, symmetric chest rise CV: regular rate and rhythm Abd: soft, nontender, nondistended. old trocar sites. No cellulitis. Moderate incisional umbilical hernia - not reducible at umbilicus Ext: no edema,  Skin: no rash, no jaundice  Labs, Imaging and Diagnostic Testing:  09/2020 - h pylori - negative, bmet nml 11/2020 virtual colonoscopy: Mildly limited evaluation.  No significant colonic polyp, mass, apple core lesion, or stricture.  Large left periumbilical hernia containing a loop of transverse colon, without obstruction or inflammatory changes. Please note that the large hernia makes the procedure technically difficult and (similar to colonoscopy) raises the likelihood of adverse complication due to insufflation.  Status post prostatectomy.  UGI 12/2018 - nml exam  I personally reviewed his virtual colonoscopy imaging. His incisional  hernia measures 7 x 5 cm. Contains nonobstructed colon  Myocardial perfusion scan report reviewed  Cardiology office note reviewed  Assessment and Plan:  Diagnoses and all orders for this visit:  Severe obesity (BMI >= 40) (CMS-HCC)  Essential hypertension  Diabetes mellitus type 2 in  obese (CMS-HCC)  Primary osteoarthritis involving multiple joints  Incisional hernia, without obstruction or gangrene   Pt has a large incisional hernia I recommended still sleeve gastrectomy. I still think the patient is a candidate for bariatric surgery. We have now met several times to discuss bariatric surgery along with the risk and benefits as well as the steps of the procedure.  Patient has been able to articulate and tell me some of the risk of surgery today without me having to remind him. He was able to tell me the risk of bleeding, injuring structures, blood clots, death, vitamin deficiencies.   He has attended his preop education class.  The patient meets weight loss surgery criteria. I think the patient would be an acceptable candidate for Laparoscopic vertical sleeve gastrectomy.   We have previously discussed the steps, risk and benefits of sleeve gastrectomy on several occasions. I offered to do that again today but he declined. He read over the surgical consent form and signed.  He has had nutritional education.  He has had cardiology evaluation  We rediscussed the preoperative meal plan. We rediscussed the typical hospitalization. We discussed the immediate postoperative meal plan for the first 2 weeks after surgery.  This patient encounter took 25 minutes on day of visit to perform the following: take history, perform exam, review outside records, interpret imaging, counsel the patient on their diagnosis  No follow-ups on file.  Leighton Ruff. Redmond Pulling MD FACS General, Minimally Invasive, & Bariatric Surgery Electronically signed by Rudean Curt, MD at 02/08/2022 8:56 AM EDT

## 2022-03-06 NOTE — Progress Notes (Signed)
PHARMACY CONSULT FOR:  Risk Assessment for Post-Discharge VTE Following Bariatric Surgery  Post-Discharge VTE Risk Assessment: This patient's probability of 30-day post-discharge VTE is increased due to the factors marked: x Sleeve gastrectomy   Liver disorder (transplant, cirrhosis, or nonalcoholic steatohepatitis)   Hx of VTE   Hemorrhage requiring transfusion   GI perforation, leak, or obstruction   ====================================================  x  Male  x Age >/=60 years    BMI >/=50 kg/m2    CHF    Dyspnea at Rest    Paraplegia  x Non-gastric-band surgery    Operation Time >/=3 hr    Return to OR     Length of Stay >/= 3 d   Hypercoagulable condition   Significant venous stasis      Predicted probability of 30-day post-discharge VTE: 0.59% moderate   Recommendation for Discharge: Enoxaparin 40 mg [place] q12h x 2 weeks post-discharge    Travis Blanchard is a 76 y.o. male who underwent sleeve gastrectomy on 03/06/22   Case start: 0811 Case end: 0926   Allergies  Allergen Reactions   Lisinopril Swelling    Facial swelling   Penicillins Anaphylaxis, Hives, Shortness Of Breath, Itching and Other (See Comments)    Has patient had a PCN reaction causing immediate rash, facial/tongue/throat swelling, SOB or lightheadedness with hypotension: Yes Has patient had a PCN reaction causing severe rash involving mucus membranes or skin necrosis: No Has patient had a PCN reaction that required hospitalization: No Has patient had a PCN reaction occurring within the last 10 years: No If all of the above answers are "NO", then may proceed with Cephalosporin use.     Patient Measurements: Height: '5\' 10"'$  (177.8 cm) Weight: 117.4 kg (258 lb 12.8 oz) IBW/kg (Calculated) : 73 Body mass index is 37.13 kg/m.  No results for input(s): "WBC", "HGB", "HCT", "PLT", "APTT", "CREATININE", "LABCREA", "CREAT24HRUR", "MG", "PHOS", "ALBUMIN", "PROT", "AST", "ALT", "ALKPHOS", "BILITOT",  "BILIDIR", "IBILI" in the last 72 hours. Estimated Creatinine Clearance: 88.5 mL/min (by C-G formula based on SCr of 0.94 mg/dL).    Past Medical History:  Diagnosis Date   Arthritis    Asthma    B12 deficiency    Cancer ([place])    Prostate   COPD (chronic obstructive pulmonary disease) ([place])    Diabetes mellitus without complication ([place])    Hyperlipidemia    Hypertension    Umbilical hernia    Vitamin D deficiency      Medications Prior to Admission  Medication Sig Dispense Refill Last Dose   acetaminophen (TYLENOL) 500 MG tablet Take 1,000 mg by mouth every 6 (six) hours as needed for moderate pain.   03/05/2022   albuterol (VENTOLIN HFA) 108 (90 Base) MCG/ACT inhaler Inhale 1 puff into the lungs every 6 (six) hours as needed for wheezing or shortness of breath. 1 Inhaler 0 03/05/2022   amLODipine (NORVASC) 5 MG tablet Take 5 mg by mouth daily.   03/05/2022   atorvastatin (LIPITOR) 20 MG tablet Take 20 mg by mouth daily.    03/05/2022   Cholecalciferol (VITAMIN D3) 1000 units CAPS Take 1,000 Units by mouth daily.   03/05/2022   [employer] 8HR ARTHRITIS PAIN RELIEF 650 MG CR tablet Take 650 mg by mouth every 8 (eight) hours as needed for pain.   03/05/2022   fluticasone (FLONASE) 50 MCG/ACT nasal spray Place 1 spray into both nostrils daily as needed for allergies.   03/05/2022   metFORMIN (GLUCOPHAGE) 500 MG tablet Take 500 mg by mouth  2 (two) times daily with a meal.    03/05/2022   omeprazole (PRILOSEC) 20 MG capsule Take 20 mg by mouth daily as needed (acid reflux).   03/05/2022   psyllium (METAMUCIL) 58.6 % powder Take 0.5 packets by mouth at bedtime.   7/42/5525   trolamine salicylate (ASPERCREME) 10 % cream Apply 1 Application topically as needed for muscle pain.   03/05/2022   vitamin B-12 (CYANOCOBALAMIN) 1000 MCG tablet Take 1,000 mcg by mouth daily.   03/05/2022   zolpidem (AMBIEN) 10 MG tablet Take 10 mg by mouth at bedtime.   03/05/2022   ipratropium-albuterol (DUONEB) 0.5-2.5 (3)  MG/3ML SOLN Take 3 mLs by nebulization every 6 (six) hours as needed (shortness of breath).   Unknown      Tawnya Crook, PharmD, BCPS Clinical Pharmacist 03/06/2022 11:41 AM

## 2022-03-06 NOTE — Transfer of Care (Signed)
Immediate Anesthesia Transfer of Care Note  Patient: Travis Blanchard  Procedure(s) Performed: LAPAROSCOPIC SLEEVE GASTRECTOMY UPPER GI ENDOSCOPY  Patient Location: PACU  Anesthesia Type:General  Level of Consciousness: awake, alert , oriented and patient cooperative  Airway & Oxygen Therapy: Patient Spontanous Breathing and Patient connected to face mask oxygen  Post-op Assessment: Report given to RN, Post -op Vital signs reviewed and stable and Patient moving all extremities X 4  Post vital signs: Reviewed and stable  Last Vitals:  Vitals Value Taken Time  BP 148/85 03/06/22 0940  Temp 36.5 C 03/06/22 0940  Pulse 72 03/06/22 0943  Resp 17 03/06/22 0943  SpO2 100 % 03/06/22 0943  Vitals shown include unvalidated device data.  Last Pain:  Vitals:   03/06/22 0544  TempSrc: Oral         Complications: No notable events documented.

## 2022-03-06 NOTE — Progress Notes (Signed)
Patient completed 6 cups of water. No c/o nausea. Started protein at this time.

## 2022-03-06 NOTE — Progress Notes (Signed)
  Transition of Care [facility]) Screening Note   Patient Details  Name: DEJA KAIGLER Date of Birth: Sep 30, 1945   Transition of Care [facility]) CM/SW Contact:    Lennart Pall, LCSW Phone Number: 03/06/2022, 12:32 PM    Transition of Care Department [facility]) has reviewed patient and no TOC needs have been identified at this time. We will continue to monitor patient advancement through interdisciplinary progression rounds. If new patient transition needs arise, please place a TOC consult.

## 2022-03-06 NOTE — Progress Notes (Signed)

## 2022-03-06 NOTE — Op Note (Signed)
03/06/2022 Travis Blanchard Mar 31, 1946 [id number]   PRE-OPERATIVE DIAGNOSIS:   Severe obesity (BMI 40)  Essential hypertension  Diabetes mellitus type 2 in obese (CMS-HCC)  Primary osteoarthritis involving multiple joints  Incisional hernia, without obstruction or gangrene  POST-OPERATIVE DIAGNOSIS:  same  PROCEDURE:  Procedure(s): LAPAROSCOPIC SLEEVE GASTRECTOMY  UPPER GI ENDOSCOPY TAPP BLOCK  SURGEON:  Surgeon(s): Gayland Curry, MD FACS FASMBS  ASSISTANTS: Johnathan Hausen MD FACS  ANESTHESIA:   general  DRAINS: none   BOUGIE: 40 fr ViSiGi  LOCAL MEDICATIONS USED:   Exparel  EBL: minimal  SPECIMEN:  Source of Specimen:  Greater curvature of stomach  DISPOSITION OF SPECIMEN:  PATHOLOGY  COUNTS:  YES  INDICATION FOR PROCEDURE: This is a very pleasant 76 y.o.-year-old morbidly obese male who has had unsuccessful attempts for sustained weight loss. The patient presents today for a planned laparoscopic sleeve gastrectomy with upper endoscopy. We have discussed the risk and benefits of the procedure extensively preoperatively. Please see my separate notes.  PROCEDURE: After obtaining informed consent and receiving 5000 units of subcutaneous heparin, the patient was brought to the operating room at [facility] and placed supine on the operating room table. General endotracheal anesthesia was established. Sequential compression devices were placed. A orogastric tube was placed. The patient's abdomen was prepped and draped in the usual standard surgical fashion. The patient received preoperative IV antibiotic. A surgical timeout was performed. ERAS protocol used.   Access to the abdomen was achieved using a 5 mm 0 laparoscope thru a 5 mm trocar In the left upper Quadrant 2 fingerbreadths below the left subcostal margin using the Optiview technique. Pneumoperitoneum was smoothly established up to 15 mm of mercury. The laparoscope was advanced and the abdominal cavity was  surveilled. The patient was then placed in reverse Trendelenburg.  The patient had a known periumbilical incisional hernia containing nonobstructed colon.  Able to place the trocars above and avoid the incisional hernia.  Otherwise there were really no other adhesions inside the abdomen.  A 5 mm trocar was placed slightly above and to the left of the umbilicus under direct visualization.  The [facility] liver retractor was placed under the left lobe of the liver through a 5 mm trocar incision site in the subxiphoid position.  There is a small 1 cm superficial tear in the liver capsule underneath the left lobe of the liver which was not unexpected occurrence-it was not bleeding.  A 5 mm trocar was placed in the lateral right upper quadrant along with a 15 mm trocar in the mid right abdomen. A final 5 mm trocar was placed in the lateral LUQ.  All under direct visualization after exparel had been infiltrated in bilateral lateral upper abdominal walls as a TAP block for postoperative pain relief.  The stomach was inspected. It was completely decompressed and the orogastric tube was removed.  There was no anterior dimple that was obviously visible.  His preoperative upper GI showed no hiatal hernia.   We identified the pylorus and measured 6 cm proximal to the pylorus and identified an area of where we would start taking down the short gastric vessels. Harmonic scalpel was used to take down the short gastric vessels along the greater curvature of the stomach. We were able to enter the lesser sac. We continued to march along the greater curvature of the stomach taking down the short gastrics. As we approached the gastrosplenic ligament we took care in this area not to injure the spleen. We  were able to take down the entire gastrosplenic ligament.  It was a little bit densely adherent to the spleen but we were able to get into the correct space and take down all the short gastrics without getting into the spleen.  We  then mobilized the fundus away from the left crus of diaphragm. There were not any significant posterior gastric avascular attachments. This left the stomach completely mobilized. No vessels had been taken down along the lesser curvature of the stomach.  We then reidentified the pylorus. A 40Fr ViSiGi was then placed in the oropharynx and advanced down into the stomach and placed in the distal antrum and positioned along the lesser curvature. It was placed under suction which secured the 40Fr ViSiGi in place along the lesser curve. Then using the Ethicon echelon 60 mm stapler with a green load with ethicon staple line reinforcement (ESLR), I placed a stapler along the antrum approximately 5 cm from the pylorus. The stapler was angled so that there is ample room at the angularis incisura. I then fired the first staple load after inspecting it posteriorly to ensure adequate space both anteriorly and posteriorly. At this point I still was not completely past the angularis so with a gold load with ESLR, I placed the stapler in position just inside the prior stapleline. We then rotated the stomach to insure that there was adequate anteriorly as well as posteriorly. The stapler was then fired.  At this point I started using 60 mm blue load staple cartridges with ESLR. The echelon stapler was then repositioned with a 60 mm blue load with ESLR and we continued to march up along the [place]. My assistant was holding traction along the greater curvature stomach along the cauterized short gastric vessels ensuring that the stomach was symmetrically retracted. Prior to each firing of the staple, we rotated the stomach to ensure that there is adequate stomach left.  As we approached the fundus, I used 60 mm blue cartridge with ESLR aiming  lateral to the GE junction after mobilizing some of the esophageal fat pad.  The sleeve was inspected. There is no evidence of cork screw. The staple line appeared hemostatic. The CRNA  inflated the ViSiGi to the green zone and the upper abdomen was flooded with saline. There were no bubbles. The sleeve was decompressed and the ViSiGi removed. My assistant scrubbed out and performed an upper endoscopy. The sleeve easily distended with air and the scope was easily advanced to the pylorus. There is no evidence of internal bleeding or cork screwing. There was no narrowing at the angularis. There is no evidence of bubbles. Please see his operative note for further details.  I reinspected the liver and there was no evidence of bleeding.  I did place a piece of surgical snow underneath the left lobe of the liver.  the gastric sleeve was decompressed and the endoscope was removed.  The greater curvature the stomach was grasped with a laparoscopic grasper and removed from the 15 mm trocar site.  The liver retractor was removed. I then closed the 15 mm trocar site with 1 interrupted 0 Vicryl sutures through the fascia using the endoclose. The closure was viewed laparoscopically and it was airtight. Remaining Exparel was then infiltrated in the preperitoneal spaces around the trocar sites. Pneumoperitoneum was released. All trocar sites were closed with a 4-0 Monocryl in a subcuticular fashion followed by the application of steri-strips, and bandaids. The patient was extubated and taken to the recovery room in  stable condition. All needle, instrument, and sponge counts were correct x2. There are no immediate complications  (1) 60 mm green with ESLR (1) 60 mm gold with ESLR (5) 60 mm blue with ESLR  PLAN OF CARE: Admit to inpatient   PATIENT DISPOSITION:  PACU - hemodynamically stable.   Delay start of Pharmacological VTE agent (>24hrs) due to surgical blood loss or risk of bleeding:  no  Leighton Ruff. Redmond Pulling, MD, FACS FASMBS General, Bariatric, & Minimally Invasive Surgery [facility], [place]

## 2022-03-06 NOTE — TOC Benefit Eligibility Note (Signed)
Patient Teacher, English as a foreign language completed.    The patient is currently admitted and upon discharge could be taking enoxaparin (Lovenox) 40 mg/0.4 ml inj.  The current 14 day co-pay is $37.58.   The patient is insured through [place], [place] Patient Advocate Specialist [place] Patient Advocate Team Direct Number: [phone]  Fax: [phone]

## 2022-03-06 NOTE — Op Note (Signed)
Travis Blanchard [id number] Apr 13, 1946 03/06/2022  Preoperative diagnosis: sleeve gastrectomy in progress  Postoperative diagnosis: Same   Procedure: Upper endoscopy   Surgeon: Catalina Antig[place] B. Hassell Done  M.D., FACS   Anesthesia: Gen.   Indications for procedure: This patient was undergoing a sleeve by Dr. Redmond Pulling.    Description of procedure: The endoscopy was placed in the mouth and into the oropharynx and under endoscopic vision it was advanced to the esophagogastric junction.  The pouch was insufflated and was cylindrical.  No hiatal hernia was seen.   No bleeding or leaks were detected.  The scope was withdrawn without difficulty.     Matt B. Hassell Done, MD, FACS General, Bariatric, & Minimally Invasive Surgery [facility], [place]

## 2022-03-06 NOTE — Anesthesia Procedure Notes (Signed)
Procedure Name: Intubation Date/Time: 03/06/2022 7:44 AM  Performed by: Jonna Munro, CRNAPre-anesthesia Checklist: Patient identified, Emergency Drugs available, Suction available, Patient being monitored and Timeout performed Patient Re-evaluated:Patient Re-evaluated prior to induction Oxygen Delivery Method: Circle system utilized Preoxygenation: Pre-oxygenation with 100% oxygen Induction Type: IV induction Ventilation: Mask ventilation without difficulty Laryngoscope Size: Mac and 4 Grade View: Grade I Tube type: Oral Tube size: 7.5 mm Number of attempts: 1 Airway Equipment and Method: Stylet Placement Confirmation: ETT inserted through vocal cords under direct vision, positive ETCO2, CO2 detector and breath sounds checked- equal and bilateral Secured at: 21 cm Tube secured with: Tape Dental Injury: Teeth and Oropharynx as per pre-operative assessment

## 2022-03-06 NOTE — Discharge Instructions (Signed)
GASTRIC BYPASS / SLEEVE  Home Care Instructions  These instructions are to help you care for yourself when you go home.  Call: If you have any problems. Call [phone] and ask for the surgeon on call If you have an emergency related to your surgery please use the ER at [place].  Tell the ER staff that you are a new post-op gastric bypass or gastric sleeve patient   Signs and symptoms to report: Severe vomiting or nausea If you cannot handle clear liquids for longer than 1 day, call your surgeon  Abdominal pain which does not get better after taking your pain medication Fever greater than 100.4 F and chills Heart rate over 100 beats a minute Trouble breathing Chest pain  Redness, swelling, drainage, or foul odor at incision (surgical) sites  If your incisions open or pull apart Swelling or pain in calf (lower leg) Diarrhea (Loose bowel movements that happen often), frequent watery, uncontrolled bowel movements Constipation, (no bowel movements for 3 days) if this happens:  Take Milk of Magnesia, 2 tablespoons by mouth, 3 times a day for 2 days if needed Stop taking Milk of Magnesia once you have had a bowel movement Call your doctor if constipation continues Or Take Miralax  (instead of Milk of Magnesia) following the label instructions Stop taking Miralax once you have had a bowel movement Call your doctor if constipation continues Anything you think is "abnormal for you"   Normal side effects after surgery: Unable to sleep at night or unable to concentrate Irritability Being tearful (crying) or depressed These are common complaints, possibly related to your anesthesia, stress of surgery and change in lifestyle, that usually go away a few weeks after surgery.  If these feelings continue, call your medical doctor.  Wound Care: You may have surgical glue, steri-strips, or staples over your incisions after surgery Surgical glue:  Looks like a clear film over your incisions  and will wear off a little at a time Steri-strips : Adhesive strips of tape over your incisions. You may notice a yellowish color on the skin under the steri-strips. This is used to make the   steri-strips stick better. Do not pull the steri-strips off - let them fall off Staples: Staples may be removed before you leave the hospital If you go home with staples, call [facility] at for an appointment with your surgeon's nurse to have staples removed 10 days after surgery, [phone] Showering: You may shower two (2) days after your surgery unless your surgeon tells you differently Wash gently around incisions with warm soapy water, rinse well, and gently pat dry  If you have a drain (tube from your incision), you may need someone to hold this while you shower  No tub baths until staples are removed and incisions are healed     Medications: Medications should be liquid or crushed if larger than the size of a dime Extended release pills (medication that releases a little bit at a time through the day) should not be crushed Depending on the size and number of medications you take, you may need to space (take a few throughout the day)/change the time you take your medications so that you do not over-fill your pouch (smaller stomach) Make sure you follow-up with your primary care physician to make medication changes needed during rapid weight loss and life-style changes If you have diabetes, follow up with the doctor that orders your diabetes medication(s) within one week after surgery and check   your blood sugar regularly. Do not drive while taking narcotics (pain medications) DO NOT take NSAID'S (Examples of NSAID's include ibuprofen, naproxen)  Diet:                    First 2 Weeks  You will see the nutritionist about two (2) weeks after your surgery. The nutritionist will increase the types of foods you can eat if you are handling liquids well: If you have severe vomiting or nausea  and cannot handle clear liquids lasting longer than 1 day, call your surgeon  Protein Shake Drink at least 2 ounces of shake 5-6 times per day Each serving of protein shakes (usually 8 - 12 ounces) should have a minimum of:  15 grams of protein  And no more than 5 grams of carbohydrate  Goal for protein each day: Men = 80 grams per day Women = 60 grams per day Protein powder may be added to fluids such as non-fat milk or Lactaid milk or Soy milk (limit to 35 grams added protein powder per serving)  Hydration Slowly increase the amount of water and other clear liquids as tolerated (See Acceptable Fluids) Slowly increase the amount of protein shake as tolerated   Sip fluids slowly and throughout the day May use sugar substitutes in small amounts (no more than 6 - 8 packets per day; i.e. Splenda)  Fluid Goal The first goal is to drink at least 8 ounces of protein shake/drink per day (or as directed by the nutritionist);  See handout from pre-op Bariatric Education Class for examples of protein shake/drink.   Slowly increase the amount of protein shake you drink as tolerated You may find it easier to slowly sip shakes throughout the day It is important to get your proteins in first Your fluid goal is to drink 64 - 100 ounces of fluid daily It may take a few weeks to build up to this 32 oz (or more) should be clear liquids  And  32 oz (or more) should be full liquids (see below for examples) Liquids should not contain sugar, caffeine, or carbonation  Clear Liquids: Water or Sugar-free flavored water (i.e. Fruit H2O, Propel) Decaffeinated coffee or tea (sugar-free) Shamus Desantis Lite, Wyler's Lite, Minute Maid Lite Sugar-free Jell-O Bouillon or broth Sugar-free Popsicle:   *Less than 20 calories each; Limit 1 per day  Full Liquids: Protein Shakes/Drinks + 2 choices per day of other full liquids Full liquids must be: No More Than 12 grams of Carbs per serving  No More Than 3 grams of Fat  per serving Strained low-fat cream soup Non-Fat milk Fat-free Lactaid Milk Sugar-free yogurt (Dannon Lite & Fit, Greek yogurt)      Vitamins and Minerals Start 1 day after surgery unless otherwise directed by your surgeon Bariatric Specific Complete Multivitamins Chewable Calcium Citrate with Vitamin D-3 (Example: 3 Chewable Calcium Plus 600 with Vitamin D-3) Take 500 mg three (3) times a day for a total of 1500 mg each day Do not take all 3 doses of calcium at one time as it may cause constipation, and you can only absorb 500 mg  at a time  Do not mix multivitamins containing iron with calcium supplements; take 2 hours apart  Menstruating women and those at risk for anemia (a blood disease that causes weakness) may need extra iron Talk with your doctor to see if you need more iron If you need extra iron: Total daily Iron recommendation (including Vitamins) is 50 to 100   mg Iron/day Do not stop taking or change any vitamins or minerals until you talk to your nutritionist or surgeon Your nutritionist and/or surgeon must approve all vitamin and mineral supplements   Activity and Exercise: It is important to continue walking at home.  Limit your physical activity as instructed by your doctor.  During this time, use these guidelines: Do not lift anything greater than ten (10) pounds for at least two (2) weeks Do not go back to work or drive until your surgeon says you can You may have sex when you feel comfortable  It is VERY important for male patients to use a reliable birth control method; fertility often increases after surgery  Do not get pregnant for at least 18 months Start exercising as soon as your doctor tells you that you can Make sure your doctor approves any physical activity Start with a simple walking program Walk 5-15 minutes each day, 7 days per week.  Slowly increase until you are walking 30-45 minutes per day Consider joining our BELT program. [phone] or email  [email or web address]   Special Instructions Things to remember:  Use your CPAP when sleeping if this applies to you, do not stop the use of CPAP unless directed by physician after a sleep study [facility] has a free Bariatric Surgery Support Group that meets monthly, the 3rd Thursday, 6 pm.  Please review discharge information for date and location of this meeting. It is very important to keep all follow up appointments with your surgeon, nutritionist, primary care physician, and behavioral health practitioner After the first year, please follow up with your bariatric surgeon and nutritionist at least once a year in order to maintain best weight loss results   [facility]: [phone]  [facility]: [phone] Bariatric Nurse Coordinator: [phone]     Enoxaparin (Lovenox) Self- Injection Instructions Wash your hands with soap and water Find a spot on the left or right side of the abdomen  2 inches from umbilicus "belly button"- DO NOT INJECT INTO INCISIONS Make sure to rotate sites (Do not inject in the same spot twice in a row) Clean the spot with alcohol swab, LET DRY Pull cap straight off, do not twist (do not let needle touch anything)Ok to leave in air bubble Place syringe in dominant (writing) hand, take other hand to "pinch an inch" (same area that you cleaned with the alcohol swab) Insert the entire needle into the fold of skin at a 90-degree angle Press the plunger all the way down with your thumb while the other hand keeps "pinching" the skin (Make sure to get all the medicine) Pull the needle straight out, then let go of the skin To activate the safety shield, hold the plunger away from yourself (and anyone else if necessary) Press with your thumb until you hear a "click" and the safety shield activates Place the used syringe and cap into the sharps disposal container.  

## 2022-03-06 NOTE — Telephone Encounter (Signed)
Pharmacy Patient Advocate Encounter  Insurance verification completed.    The patient is insured through [employer] Part D   The patient is currently admitted and ran test claims for the following: enoxaparin.  Copays and coinsurance results were relayed to Inpatient clinical team.

## 2022-03-06 NOTE — Anesthesia Postprocedure Evaluation (Signed)
Anesthesia Post Note  Patient: Travis Blanchard  Procedure(s) Performed: LAPAROSCOPIC SLEEVE GASTRECTOMY UPPER GI ENDOSCOPY     Patient location during evaluation: PACU Anesthesia Type: General Level of consciousness: awake and alert Pain management: pain level controlled Vital Signs Assessment: post-procedure vital signs reviewed and stable Respiratory status: spontaneous breathing, nonlabored ventilation, respiratory function stable and patient connected to nasal cannula oxygen Cardiovascular status: blood pressure returned to baseline and stable Postop Assessment: no apparent nausea or vomiting Anesthetic complications: no   No notable events documented.  Last Vitals:  Vitals:   03/06/22 1309 03/06/22 1406  BP: (!) 142/86 128/71  Pulse: 74 85  Resp: 18 18  Temp: (!) 36.3 C   SpO2: 100% 100%                Effie Berkshire

## 2022-03-06 NOTE — Interval H&P Note (Signed)
History and Physical Interval Note:  03/06/2022 7:25 AM  Travis Blanchard  has presented today for surgery, with the diagnosis of MORBID OBESITY.  The various methods of treatment have been discussed with the patient and family. After consideration of risks, benefits and other options for treatment, the patient has consented to  Procedure(s): LAPAROSCOPIC SLEEVE GASTRECTOMY (N/A) UPPER GI ENDOSCOPY (N/A) as a surgical intervention.  The patient's history has been reviewed, patient examined, no change in status, stable for surgery.  I have reviewed the patient's chart and labs.  Questions were answered to the patient's satisfaction.    Leighton Ruff. Redmond Pulling, MD, [place], Bariatric, & Minimally Invasive Surgery [facility],  [place]  Greer Pickerel

## 2022-03-07 ENCOUNTER — Encounter (HOSPITAL_COMMUNITY): Payer: Self-pay | Admitting: General Surgery

## 2022-03-07 ENCOUNTER — Other Ambulatory Visit (HOSPITAL_COMMUNITY): Payer: Self-pay

## 2022-03-07 LAB — CBC WITH DIFFERENTIAL/PLATELET
Abs Immature Granulocytes: 0.04 10*3/uL (ref 0.00–0.07)
Basophils Absolute: 0 10*3/uL (ref 0.0–0.1)
Basophils Relative: 0 %
Eosinophils Absolute: 0 10*3/uL (ref 0.0–0.5)
Eosinophils Relative: 0 %
HCT: 44.3 % (ref 39.0–52.0)
Hemoglobin: 14.7 g/dL (ref 13.0–17.0)
Immature Granulocytes: 0 %
Lymphocytes Relative: 16 %
Lymphs Abs: 1.8 10*3/uL (ref 0.7–4.0)
MCH: 29.5 pg (ref 26.0–34.0)
MCHC: 33.2 g/dL (ref 30.0–36.0)
MCV: 89 fL (ref 80.0–100.0)
Monocytes Absolute: 0.9 10*3/uL (ref 0.1–1.0)
Monocytes Relative: 8 %
Neutro Abs: 8.5 10*3/uL — ABNORMAL HIGH (ref 1.7–7.7)
Neutrophils Relative %: 76 %
Platelets: 197 10*3/uL (ref 150–400)
RBC: 4.98 MIL/uL (ref 4.22–5.81)
RDW: 12.3 % (ref 11.5–15.5)
WBC: 11.3 10*3/uL — ABNORMAL HIGH (ref 4.0–10.5)
nRBC: 0 % (ref 0.0–0.2)

## 2022-03-07 LAB — COMPREHENSIVE METABOLIC PANEL
ALT: 22 U/L (ref 0–44)
AST: 20 U/L (ref 15–41)
Albumin: 4 g/dL (ref 3.5–5.0)
Alkaline Phosphatase: 68 U/L (ref 38–126)
Anion gap: 10 (ref 5–15)
BUN: 8 mg/dL (ref 8–23)
CO2: 24 mmol/L (ref 22–32)
Calcium: 8.9 mg/dL (ref 8.9–10.3)
Chloride: 102 mmol/L (ref 98–111)
Creatinine, Ser: 0.69 mg/dL (ref 0.61–1.24)
GFR, Estimated: >60 mL/min (ref >60)
Glucose, Bld: 104 mg/dL — ABNORMAL HIGH (ref 70–99)
Potassium: 3.8 mmol/L (ref 3.5–5.1)
Sodium: 136 mmol/L (ref 135–145)
Total Bilirubin: 0.7 mg/dL (ref 0.3–1.2)
Total Protein: 7.4 g/dL (ref 6.5–8.1)

## 2022-03-07 LAB — SURGICAL PATHOLOGY

## 2022-03-07 LAB — GLUCOSE, CAPILLARY
Glucose-Capillary: 110 mg/dL — ABNORMAL HIGH (ref 70–99)
Glucose-Capillary: 102 mg/dL — ABNORMAL HIGH (ref 70–99)
Glucose-Capillary: 108 mg/dL — ABNORMAL HIGH (ref 70–99)

## 2022-03-07 MED ORDER — ACETAMINOPHEN 500 MG PO TABS: 1000.0000 mg | ORAL_TABLET | Freq: Three times a day (TID) | ORAL | 0 refills | Status: AC

## 2022-03-07 MED ORDER — ONDANSETRON 4 MG PO TBDP
4.0000 mg | ORAL_TABLET | Freq: Four times a day (QID) | ORAL | 0 refills | Status: DC | PRN
  Filled 2022-03-07: qty 20, 5d supply, fill #0

## 2022-03-07 MED ORDER — ENOXAPARIN SODIUM 40 MG/0.4ML IJ SOSY
40.0000 mg | PREFILLED_SYRINGE | Freq: Two times a day (BID) | INTRAMUSCULAR | 0 refills | Status: DC
  Filled 2022-03-07: qty 11.2, 14d supply, fill #0

## 2022-03-07 MED ORDER — TRAMADOL HCL 50 MG PO TABS
50.0000 mg | ORAL_TABLET | Freq: Four times a day (QID) | ORAL | 0 refills | Status: DC | PRN
  Filled 2022-03-07: qty 10, 3d supply, fill #0

## 2022-03-07 MED ORDER — ENOXAPARIN (LOVENOX) PATIENT EDUCATION KIT
PACK | Freq: Once | Status: AC
  Filled 2022-03-07: qty 1

## 2022-03-07 NOTE — Progress Notes (Signed)
Mobility Specialist - Progress Note   Pre-mobility: 99% SpO2 During mobility: 95% SpO2 Post-mobility: 98% SPO2   03/07/22 1002  Oxygen Therapy  O2 Device Nasal Cannula  O2 Flow Rate (L/min) 2 L/min  Mobility  Activity Ambulated with assistance in hallway  Range of Motion/Exercises Active  Level of Assistance Modified independent, requires aide device or extra time  Assistive Device None  Distance Ambulated (ft) 300 ft  Activity Response Tolerated well  $Mobility charge 1 Mobility   Pt was found in recliner chair and agreeable to mobilize. Pt had no complaints and at EOS returned back to recliner chair with all necessities in reach.  Ferd Hibbs Mobility Specialist

## 2022-03-07 NOTE — Discharge Summary (Signed)
Physician Discharge Summary  Travis Blanchard CBJ:[id number] DOB: [birth year] DOA: 03/06/2022  PCP: Carrolyn Meiers, MD  Admit date: 03/06/2022 Discharge date: 03/07/2022  Recommendations for Outpatient Follow-up:    Follow-up Information     Maczis, Carlena Hurl, PA-C. Go on 03/27/2022.   Specialty: General Surgery Why: at 2:15pm for Dr. Redmond Pulling.  Please arrive 15 minutes prior to your appointment time.  Thank you. Contact information: [place] [address] [phone]         Greer Pickerel, MD. Go on 05/02/2022.   Specialty: General Surgery Why: at 9:30am.  Please arrive 15 minutes prior to your appointment time.  Thank you. 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 Please see operative note. Preoperatively the patient was given 5000 units of subcutaneous heparin for DVT prophylaxis. Postoperative prophylactic Lovenox dosing was started on the evening of postoperative day 0. ERAS protocol was used. On the evening of postoperative day 0, the patient was started on water and ice chips. On postoperative day 1 the patient had no fever or tachycardia and was tolerating water in their diet was gradually advanced throughout the day. The patient was ambulating without  difficulty. Their vital signs are stable without fever or tachycardia. Their hemoglobin had remained stable.  The patient had received discharge instructions and counseling. They were deemed stable for discharge and had met discharge criteria.    Based on his age, type of surgery, gender, he met criteria for extended VTE prophylaxis on discharge.  He received education on how to do Lovenox injections for 2 weeks.  BP (!) 156/69 (BP Location: Right Arm)   Pulse 69   Temp 98.9 F (37.2 C) (Oral)   Resp 18   Ht 5' 10"  (1.778 m)   Wt 117.4 kg   SpO2 100%   BMI 37.13 kg/m   Gen: alert, NAD, non-toxic appearing Pupils: equal, no scleral icterus Pulm: Lungs clear to auscultation, symmetric chest rise CV: regular rate and rhythm Abd: soft, min tender, nondistended. s. No cellulitis. No incisional hernia Ext: no edema, no calf tenderness Skin: no rash, no jaundice   Discharge Instructions  Discharge Instructions     Ambulate hourly while awake   Complete by: As directed    Call MD for:  difficulty breathing, headache or visual disturbances   Complete by: As directed    Call MD for:  persistant dizziness or light-headedness   Complete by: As directed    Call MD for:  persistant nausea and vomiting   Complete by: As directed    Call MD for:  redness, tenderness, or signs of infection (pain, swelling, redness, odor or green/yellow discharge around incision site)   Complete by: As directed    Call MD for:  severe uncontrolled pain   Complete by: As directed  Call MD for:  temperature >101 F   Complete by: As directed    Diet bariatric full liquid   Complete by: As directed    Discharge instructions   Complete by: As directed    See bariatric discharge instructions   Incentive spirometry   Complete by: As directed    Perform hourly while awake      Allergies as of 03/07/2022       Reactions   Lisinopril Swelling   Facial swelling   Penicillins Anaphylaxis, Hives,  Shortness Of Breath, Itching, Other (See Comments)   Has patient had a PCN reaction causing immediate rash, facial/tongue/throat swelling, SOB or lightheadedness with hypotension: Yes Has patient had a PCN reaction causing severe rash involving mucus membranes or skin necrosis: No Has patient had a PCN reaction that required hospitalization: No Has patient had a PCN reaction occurring within the last 10 years: No If all of the above answers are "NO", then may proceed with Cephalosporin use.        Medication List     STOP taking these medications    metFORMIN 500 MG tablet Commonly known as: GLUCOPHAGE       TAKE these medications    acetaminophen 500 MG tablet Commonly known as: TYLENOL Take 2 tablets (1,000 mg total) by mouth every 8 (eight) hours for 5 days. What changed:  when to take this reasons to take this Another medication with the same name was removed. Continue taking this medication, and follow the directions you see here.   albuterol 108 (90 Base) MCG/ACT inhaler Commonly known as: Ventolin HFA Inhale 1 puff into the lungs every 6 (six) hours as needed for wheezing or shortness of breath.   amLODipine 5 MG tablet Commonly known as: NORVASC Take 5 mg by mouth daily. Notes to patient: Monitor Blood Pressure Daily and keep a log for primary care physician.  You may need to make changes to your medications with rapid weight loss.     atorvastatin 20 MG tablet Commonly known as: LIPITOR Take 20 mg by mouth daily.   cyanocobalamin 1000 MCG tablet Commonly known as: VITAMIN B12 Take 1,000 mcg by mouth daily.   enoxaparin 40 MG/0.4ML injection Commonly known as: LOVENOX Inject 0.4 mLs (40 mg total) into the skin every 12 (twelve) hours for 14 days.   fluticasone 50 MCG/ACT nasal spray Commonly known as: FLONASE Place 1 spray into both nostrils daily as needed for allergies.   ipratropium-albuterol 0.5-2.5 (3) MG/3ML Soln Commonly known as: DUONEB Take 3  mLs by nebulization every 6 (six) hours as needed (shortness of breath).   omeprazole 20 MG capsule Commonly known as: PRILOSEC Take 20 mg by mouth daily as needed (acid reflux).   ondansetron 4 MG disintegrating tablet Commonly known as: ZOFRAN-ODT Take 1 tablet (4 mg total) by mouth every 6 (six) hours as needed for nausea or vomiting.   psyllium 58.6 % powder Commonly known as: METAMUCIL Take 0.5 packets by mouth at bedtime.   traMADol 50 MG tablet Commonly known as: ULTRAM Take 1 tablet (50 mg total) by mouth every 6 (six) hours as needed (pain).   trolamine salicylate 10 % cream Commonly known as: ASPERCREME Apply 1 Application topically as needed for muscle pain.   Vitamin D3 25 MCG (1000 UT) Caps Take 1,000 Units by mouth daily.   zolpidem 10 MG tablet Commonly known as: AMBIEN Take 10 mg by mouth at bedtime.        Follow-up Information  Maczis, Puja Gosai, PA-C. Go on 03/27/2022.   Specialty: General Surgery Why: at 2:15pm for Dr. Redmond Pulling.  Please arrive 15 minutes prior to your appointment time.  Thank you. Contact information: [place] [address] [phone]         Greer Pickerel, MD. Go on 05/02/2022.   Specialty: General Surgery Why: at 9:30am.  Please arrive 15 minutes prior to your appointment time.  Thank you. Contact information: [place] [address] [phone]                  The results of significant diagnostics from this hospitalization (including imaging, microbiology, ancillary and laboratory) are listed below for reference.    Significant Diagnostic Studies: No results found.  Labs: Basic Metabolic Panel: Recent Labs  Lab 03/07/22 0428  NA 136  K 3.8  CL 102  CO2 24  GLUCOSE 104*  BUN 8  CREATININE 0.69  CALCIUM 8.9   Liver Function Tests: Recent Labs  Lab 03/07/22 0428  AST 20  ALT 22  ALKPHOS 68  BILITOT 0.7  PROT 7.4  ALBUMIN 4.0     CBC: Recent Labs  Lab 03/06/22 1314 03/07/22 0428  WBC  --  11.3*  NEUTROABS  --  8.5*  HGB 14.4 14.7  HCT 44.1 44.3  MCV  --  89.0  PLT  --  197    CBG: Recent Labs  Lab 03/06/22 2004 03/06/22 2333 03/07/22 0410 03/07/22 0743 03/07/22 1213  GLUCAP 170* 150* 110* 102* 108*    Principal Problem:   S/P laparoscopic sleeve gastrectomy   Time coordinating discharge: 20 min  Signed:  Gayland Curry, MD [facility], [place] [phone] 03/07/2022, 2:13 PM

## 2022-03-07 NOTE — Progress Notes (Signed)
Discharge instructions given to patient, verbalized agreement and understanding

## 2022-03-07 NOTE — Progress Notes (Signed)
Patient alert and oriented, pain is controlled. Patient is tolerating fluids, advanced to protein shake today, patient is tolerating well.  Reviewed Gastric sleeve discharge instructions with patient and patient is able to articulate understanding.  Provided information on BELT program, Support Group and [facility] outpatient pharmacy. Reviewed Lovenox teaching kit and pt reviewed Lovenox "Patient-Self Injection Video".  All questions answered, will continue to monitor.  

## 2022-03-10 ENCOUNTER — Telehealth (HOSPITAL_COMMUNITY): Payer: Self-pay | Admitting: *Deleted

## 2022-03-13 NOTE — Telephone Encounter (Signed)
1.  Tell me about your pain and pain management? Pt denies any pain. Pt states that "his Tylenol is doing the trick".  He is able to get around and walk and his knees are feeling better.   2.  Let's talk about fluid intake.  How much total fluid are you taking in? Pt states that he is getting in at least 64oz of fluid including protein shakes, bottled water, and broth.  3.  How much protein have you taken in the last 2 days? Pt states he is meeting his goal of 80g of protein each day with the protein shakes.  4.  Have you had nausea?  Tell me about when have experienced nausea and what you did to help? Pt denies nausea.   5.  Has the frequency or color changed with your urine? Pt states that he is urinating "fine" with no changes in frequency or urgency.     6.  Tell me what your incisions look like? Pt has showered, but not removed gauze covered tegaderm.  Instructed pt to remove outer bandages (removed during our call) and leave incisions open to air and monitor for any abnormal symptoms. Pt denies a fever, chills. Pt encouraged to call [facility] if incisions change.   7.  Have you been passing gas? BM? Pt states that he is having BMs. Last BM 03/12/22.     8.  If a problem or question were to arise who would you call?  Do you know contact numbers for [facility], [facility], and [facility]? Pt denies dehydration symptoms.  Pt can describe s/sx of dehydration.  Pt knows to call [facility] for surgical, [facility] for nutrition, and [facility] for non-urgent questions or concerns.   9.  How has the walking going? Pt states he is walking around and able to be active without difficulty.   10. Are you still using your incentive spirometer?  If so, how often? Pt states that he is doing the I.S. "throughout the day". Pt encouraged to use incentive spirometer, at least 10x every hour while awake until he sees the surgeon.  11.  How are your vitamins and calcium going?  How are you taking them? Pt states that he is taking his supplements  and vitamins without difficulty. Reinforced education about taking supplements at least two hours apart.  LOVENOX: Pt states that he is taking the Lovenox injections without difficulty.  Reinforced education about taking injections q12h and rotating injection sites.  Pt also instructed to monitor for unusual bruising and/or signs of bleeding.  Reminded patient that the first 30 days post-operatively are important for successful recovery.  Practice good hand hygiene, wearing a mask when appropriate (since optional in most places), and minimizing exposure to people who live outside of the home, especially if they are exhibiting any respiratory, GI, or illness-like symptoms.

## 2022-03-20 ENCOUNTER — Encounter: Payer: Medicare Other | Attending: General Surgery | Admitting: Dietician

## 2022-03-20 ENCOUNTER — Encounter: Payer: Self-pay | Admitting: Dietician

## 2022-03-20 DIAGNOSIS — E669 Obesity, unspecified: Secondary | ICD-10-CM | POA: Insufficient documentation

## 2022-03-20 DIAGNOSIS — E1140 Type 2 diabetes mellitus with diabetic neuropathy, unspecified: Secondary | ICD-10-CM | POA: Diagnosis not present

## 2022-03-20 NOTE — Progress Notes (Signed)
2 Week Post-Operative Nutrition Class   Patient was seen on 03/20/2022 for Post-Operative Nutrition education at the [facility].    Surgery date: 03/06/2022 Surgery type: sleeve  Anthropometrics  Start weight at [facility]: 288 lbs (date: 12/22/2020)  Height: 70.5 in Weight today: patient requested no wt taken BMI:  kg/m2     Clinical  Medical hx: prostate cancer, diabetes, HTN, hyperlipidemia, COPD, gastric ulcer Medications: amlodipine, omperezole, b12, lipitor, metformin   Labs: 2 years ago 6.6 Notable signs/symptoms: none identified  Any previous deficiencies? Yes, vitmain B12 Bowel Habits: Every day to every other day no complaints     The following the learning objectives were met by the patient during this course: Identifies Phase 3 (Soft, High Proteins) Dietary Goals and will begin from 2 weeks post-operatively to 2 months post-operatively Identifies appropriate sources of fluids and proteins  Identifies appropriate fat sources and healthy verses unhealthy fat types   States protein recommendations and appropriate sources post-operatively Identifies the need for appropriate texture modifications, mastication, and bite sizes when consuming solids Identifies appropriate fat consumption and sources Identifies appropriate multivitamin and calcium sources post-operatively Describes the need for physical activity post-operatively and will follow MD recommendations States when to call healthcare provider regarding medication questions or post-operative complications   Handouts given during class include: Phase 3A: Soft, High Protein Diet Handout Phase 3 High Protein Meals Healthy Fats   Follow-Up Plan: Patient will follow-up at [facility] in 6 weeks for 2 month post-op nutrition visit for diet advancement per MD.

## 2022-03-21 ENCOUNTER — Ambulatory Visit (INDEPENDENT_AMBULATORY_CARE_PROVIDER_SITE_OTHER): Payer: Medicare Other | Admitting: Podiatry

## 2022-03-21 DIAGNOSIS — M79674 Pain in right toe(s): Secondary | ICD-10-CM

## 2022-03-21 DIAGNOSIS — M79675 Pain in left toe(s): Secondary | ICD-10-CM

## 2022-03-21 DIAGNOSIS — B351 Tinea unguium: Secondary | ICD-10-CM | POA: Diagnosis not present

## 2022-03-21 NOTE — Progress Notes (Signed)
  Subjective:  Patient ID: Travis Blanchard, male    DOB: Oct 29, 1945,  MRN: [id number]  Chief Complaint  Patient presents with   Diabetes    Rt foot 4th toe blister possible spider bite   76 y.o. male returns for the above complaint.  Patient presents with thickened elongated dystrophic toenails x10 mild pain on palpation.  Patient would like for me to debride down he has not seen anyone else prior to seeing me.  Denied any headache any other acute complaints.  Objective:  There were no vitals filed for this visit. Podiatric Exam: Vascular: dorsalis pedis and posterior tibial pulses are palpable bilateral. Capillary return is immediate. Temperature gradient is WNL. Skin turgor WNL  Sensorium: Normal Semmes Weinstein monofilament test. Normal tactile sensation bilaterally. Nail Exam: Pt has thick disfigured discolored nails with subungual debris noted bilateral entire nail hallux through fifth toenails.  Pain on palpation to the nails. Ulcer Exam: There is no evidence of ulcer or pre-ulcerative changes or infection. Orthopedic Exam: Muscle tone and strength are WNL. No limitations in general ROM. No crepitus or effusions noted.  Skin: No Porokeratosis. No infection or ulcers    Assessment & Plan:   1. Pain due to onychomycosis of toenails of both feet     Patient was evaluated and treated and all questions answered.  Onychomycosis with pain  -Nails palliatively debrided as below. -Educated on self-care  Procedure: Nail Debridement Rationale: pain  Type of Debridement: manual, sharp debridement. Instrumentation: Nail nipper, rotary burr. Number of Nails: 10  Procedures and Treatment: Consent by patient was obtained for treatment procedures. The patient understood the discussion of treatment and procedures well. All questions were answered thoroughly reviewed. Debridement of mycotic and hypertrophic toenails, 1 through 5 bilateral and clearing of subungual debris. No ulceration, no  infection noted.  Return Visit-Office Procedure: Patient instructed to return to the office for a follow up visit 3 months for continued evaluation and treatment.  Boneta Lucks, DPM    Return in about 3 months (around 06/20/2022) for Routine Foot Care.

## 2022-03-27 ENCOUNTER — Telehealth: Payer: Self-pay | Admitting: Dietician

## 2022-03-27 NOTE — Telephone Encounter (Signed)
RD called pt to verify fluid intake once starting soft, solid proteins 2 week post-bariatric surgery.   Daily Fluid intake:  Daily Protein intake:  Bowel Habits:   Concerns/issues:   Left voice message 

## 2022-03-27 NOTE — Telephone Encounter (Signed)
RD called pt to verify fluid intake once starting soft, solid proteins 2 week post-bariatric surgery.   Daily Fluid intake:  64+ oz Daily Protein intake: 80 grams Bowel Habits: constipation, states he is using a laxative.  Pt states he did not like using the Murelax.  Concerns/issues:  Pt states he does not want to wait for the next stage in 5 more weeks.  Pt has chosen to advance to non-starchy vegetables, stating he lost more weight on the pre-op diet, with non-starchy vegetables, than he did with straight protein (liquid or solids).  Dietitian advised that he start with cooked vegetables, and slowly add different vegetables to monitor tolerance.

## 2022-03-30 DIAGNOSIS — E785 Hyperlipidemia, unspecified: Secondary | ICD-10-CM | POA: Diagnosis not present

## 2022-03-30 DIAGNOSIS — E118 Type 2 diabetes mellitus with unspecified complications: Secondary | ICD-10-CM | POA: Diagnosis not present

## 2022-04-29 DIAGNOSIS — I10 Essential (primary) hypertension: Secondary | ICD-10-CM | POA: Diagnosis not present

## 2022-04-29 DIAGNOSIS — E118 Type 2 diabetes mellitus with unspecified complications: Secondary | ICD-10-CM | POA: Diagnosis not present

## 2022-04-30 DIAGNOSIS — E119 Type 2 diabetes mellitus without complications: Secondary | ICD-10-CM | POA: Diagnosis not present

## 2022-04-30 DIAGNOSIS — H524 Presbyopia: Secondary | ICD-10-CM | POA: Diagnosis not present

## 2022-04-30 DIAGNOSIS — H25813 Combined forms of age-related cataract, bilateral: Secondary | ICD-10-CM | POA: Diagnosis not present

## 2022-05-02 ENCOUNTER — Encounter: Payer: Self-pay | Admitting: Skilled Nursing Facility1

## 2022-05-02 ENCOUNTER — Encounter: Payer: Medicare Other | Attending: General Surgery | Admitting: Skilled Nursing Facility1

## 2022-05-02 DIAGNOSIS — E1140 Type 2 diabetes mellitus with diabetic neuropathy, unspecified: Secondary | ICD-10-CM | POA: Insufficient documentation

## 2022-05-02 NOTE — Progress Notes (Signed)
Bariatric Nutrition Follow-Up Visit Medical Nutrition Therapy   Surgery date: 03/06/2022 Surgery type: sleeve  NUTRITION ASSESSMENT    Anthropometrics  Anthropometrics  Start weight at [facility]: 288 lbs (date: 12/22/2020)  Weight: pt states he is 253 pounds   Clinical  Medical hx: prostate cancer, diabetes, HTN, hyperlipidemia, COPD, gastric ulcer Medications: amlodipine, omperezole, b12, lipitor, metformin   Labs: Notable signs/symptoms: none identified  Any previous deficiencies? Yes, vitmain B12 Bowel Habits: Every day to every other day no complaints   Lifestyle & Dietary Hx  Pt states he is really upest he cannot get his umbilical hernia repair until he loses 30-40 more pounds.   Pt stateshe has lost 60 pounds so far.  Pt states the whole purpose of having bariatric surgery was to have the hernia surgery. Pt states he has lost his mobitly becase he has not been able to take his pain medicine due to having bariatric surgery.  Pt states he is so disappointed he does not even have motivation to lose weight and will have a tough time controlling his portions because what is the point: Dietitian worked with pt to assist in his attitude towards behavior change and the positive outcomes it will have.   Pt states his blood sugars have been about 125-146 pounds.   Pt states he got a blood sugar of over 200 after eating 2 tacos and refried beans after further discussion he did make a cobbler.   Estimated daily fluid intake: 46 oz Estimated daily protein intake: 80+ g Supplements: multivitamin and calcium Current average weekly physical activity: in too much pain   24-Hr Dietary Recall First Meal 10:30-11: 1 packet oatmeal + berries + cinnamon  Snack:  nuts Second Meal: 1-2 chicken thighs with raw broccoli/caulofwer/onion Snack:  peach cobbler Third Meal: [place] wrap: chicken, shrimp, steak + peppers + onions + oil + broccoli + cauliflower + celery + salad dressing and refried  beans Snack:  Beverages: 12 coffee  Post-Op Goals/ Signs/ Symptoms Using straws: no Drinking while eating: no Chewing/swallowing difficulties: no Changes in vision: no Changes to mood/headaches: no Hair loss/changes to skin/nails: no Difficulty focusing/concentrating: no Sweating: no Limb weakness: no Dizziness/lightheadedness: no Palpitations: no  Carbonated/caffeinated beverages: no N/V/D/C/Gas: no; taking metamucil daily Abdominal pain: no Dumping syndrome: no    NUTRITION DIAGNOSIS  Overweight/obesity ([place]-3.3) related to past poor dietary habits and physical inactivity as evidenced by completed bariatric surgery and following dietary guidelines for continued weight loss and healthy nutrition status.     NUTRITION INTERVENTION Nutrition counseling (C-1) and education (E-2) to facilitate bariatric surgery goals, including: The importance of consuming adequate calories as well as certain nutrients daily due to the body's need for essential vitamins, minerals, and fats The importance of daily physical activity and to reach a goal of at least 150 minutes of moderate to vigorous physical activity weekly (or as directed by their physician) due to benefits such as increased musculature and improved lab values The importance of intuitive eating specifically learning hunger-satiety cues and understanding the importance of learning a new body: The importance of mindful eating to avoid grazing behaviors  Encouraged patient to honor their body's internal hunger and fullness cues.  Throughout the day, check in mentally and rate hunger. Stop eating when satisfied not full regardless of how much food is left on the plate.  Get more if still hungry 20-30 minutes later.  The key is to honor satisfaction so throughout the meal, rate fullness factor and stop when comfortably  satisfied not physically full. The key is to honor hunger and fullness without any feelings of guilt or shame.  Pay attention to  what the internal cues are, rather than any external factors. This will enhance the confidence you have in listening to your own body and following those internal cues enabling you to increase how often you eat when you are hungry not out of appetite and stop when you are satisfied not full.  Encouraged pt to continue to eat balanced meals inclusive of non starchy vegetables 2 times a day 7 days a week Encouraged pt to choose lean protein sources: limiting beef, pork, sausage, hotdogs, and lunch meat Encourage pt to choose healthy fats such as plant based limiting animal fats Encouraged pt to continue to drink a minium 64 fluid ounces with half being plain water to satisfy proper hydration  -watch those limits: limit yourself to 1/4 cup per day To have the fastest weight loss completely avoid eating sweets   Learning Style & Readiness for Change Teaching method utilized: Visual & Auditory  Demonstrated degree of understanding via: Teach Back  Readiness Level: pre-contemplative Barriers to learning/adherence to lifestyle change: disappointment   RD's Notes for Next Visit Assess adherence to pt chosen goals    MONITORING & EVALUATION Dietary intake, weekly physical activity, body weight  Next Steps Patient is to follow-up in 4 months

## 2022-05-30 DIAGNOSIS — E785 Hyperlipidemia, unspecified: Secondary | ICD-10-CM | POA: Diagnosis not present

## 2022-05-30 DIAGNOSIS — E118 Type 2 diabetes mellitus with unspecified complications: Secondary | ICD-10-CM | POA: Diagnosis not present

## 2022-06-11 DIAGNOSIS — M25561 Pain in right knee: Secondary | ICD-10-CM | POA: Diagnosis not present

## 2022-06-11 DIAGNOSIS — M25562 Pain in left knee: Secondary | ICD-10-CM | POA: Diagnosis not present

## 2022-06-19 DIAGNOSIS — M25561 Pain in right knee: Secondary | ICD-10-CM | POA: Diagnosis not present

## 2022-06-19 DIAGNOSIS — M25562 Pain in left knee: Secondary | ICD-10-CM | POA: Diagnosis not present

## 2022-06-21 DIAGNOSIS — M25562 Pain in left knee: Secondary | ICD-10-CM | POA: Diagnosis not present

## 2022-06-21 DIAGNOSIS — M25561 Pain in right knee: Secondary | ICD-10-CM | POA: Diagnosis not present

## 2022-06-26 DIAGNOSIS — M25562 Pain in left knee: Secondary | ICD-10-CM | POA: Diagnosis not present

## 2022-06-26 DIAGNOSIS — M25561 Pain in right knee: Secondary | ICD-10-CM | POA: Diagnosis not present

## 2022-06-29 DIAGNOSIS — E118 Type 2 diabetes mellitus with unspecified complications: Secondary | ICD-10-CM | POA: Diagnosis not present

## 2022-06-29 DIAGNOSIS — M25561 Pain in right knee: Secondary | ICD-10-CM | POA: Diagnosis not present

## 2022-06-29 DIAGNOSIS — M25562 Pain in left knee: Secondary | ICD-10-CM | POA: Diagnosis not present

## 2022-06-29 DIAGNOSIS — I10 Essential (primary) hypertension: Secondary | ICD-10-CM | POA: Diagnosis not present

## 2022-07-11 DIAGNOSIS — M25562 Pain in left knee: Secondary | ICD-10-CM | POA: Diagnosis not present

## 2022-07-11 DIAGNOSIS — M25561 Pain in right knee: Secondary | ICD-10-CM | POA: Diagnosis not present

## 2022-07-13 DIAGNOSIS — M25561 Pain in right knee: Secondary | ICD-10-CM | POA: Diagnosis not present

## 2022-07-13 DIAGNOSIS — M25562 Pain in left knee: Secondary | ICD-10-CM | POA: Diagnosis not present

## 2022-07-17 DIAGNOSIS — M25561 Pain in right knee: Secondary | ICD-10-CM | POA: Diagnosis not present

## 2022-07-17 DIAGNOSIS — M25562 Pain in left knee: Secondary | ICD-10-CM | POA: Diagnosis not present

## 2022-07-19 DIAGNOSIS — M25561 Pain in right knee: Secondary | ICD-10-CM | POA: Diagnosis not present

## 2022-07-19 DIAGNOSIS — M25562 Pain in left knee: Secondary | ICD-10-CM | POA: Diagnosis not present

## 2022-07-20 DIAGNOSIS — M1611 Unilateral primary osteoarthritis, right hip: Secondary | ICD-10-CM | POA: Diagnosis not present

## 2022-07-20 DIAGNOSIS — M1711 Unilateral primary osteoarthritis, right knee: Secondary | ICD-10-CM | POA: Diagnosis not present

## 2022-07-25 DIAGNOSIS — M25562 Pain in left knee: Secondary | ICD-10-CM | POA: Diagnosis not present

## 2022-07-25 DIAGNOSIS — M25561 Pain in right knee: Secondary | ICD-10-CM | POA: Diagnosis not present

## 2022-07-29 DIAGNOSIS — Z23 Encounter for immunization: Secondary | ICD-10-CM | POA: Diagnosis not present

## 2022-07-31 DIAGNOSIS — M25561 Pain in right knee: Secondary | ICD-10-CM | POA: Diagnosis not present

## 2022-07-31 DIAGNOSIS — M25562 Pain in left knee: Secondary | ICD-10-CM | POA: Diagnosis not present

## 2022-08-02 DIAGNOSIS — K219 Gastro-esophageal reflux disease without esophagitis: Secondary | ICD-10-CM | POA: Diagnosis not present

## 2022-08-02 DIAGNOSIS — Z1389 Encounter for screening for other disorder: Secondary | ICD-10-CM | POA: Diagnosis not present

## 2022-08-02 DIAGNOSIS — I10 Essential (primary) hypertension: Secondary | ICD-10-CM | POA: Diagnosis not present

## 2022-08-02 DIAGNOSIS — E118 Type 2 diabetes mellitus with unspecified complications: Secondary | ICD-10-CM | POA: Diagnosis not present

## 2022-08-02 DIAGNOSIS — E785 Hyperlipidemia, unspecified: Secondary | ICD-10-CM | POA: Diagnosis not present

## 2022-08-02 DIAGNOSIS — Z23 Encounter for immunization: Secondary | ICD-10-CM | POA: Diagnosis not present

## 2022-08-02 DIAGNOSIS — Z1331 Encounter for screening for depression: Secondary | ICD-10-CM | POA: Diagnosis not present

## 2022-08-07 DIAGNOSIS — M25561 Pain in right knee: Secondary | ICD-10-CM | POA: Diagnosis not present

## 2022-08-07 DIAGNOSIS — M25562 Pain in left knee: Secondary | ICD-10-CM | POA: Diagnosis not present

## 2022-08-09 DIAGNOSIS — M25562 Pain in left knee: Secondary | ICD-10-CM | POA: Diagnosis not present

## 2022-08-09 DIAGNOSIS — M25561 Pain in right knee: Secondary | ICD-10-CM | POA: Diagnosis not present

## 2022-08-10 ENCOUNTER — Other Ambulatory Visit (HOSPITAL_COMMUNITY): Payer: Self-pay

## 2022-08-10 ENCOUNTER — Telehealth: Payer: Self-pay | Admitting: *Deleted

## 2022-08-10 NOTE — Telephone Encounter (Signed)
   Pre-operative Risk Assessment    Patient Name: Travis Blanchard  DOB: 08-Apr-1946 MRN: [id number]      Request for Surgical Clearance    Procedure:   right total hip arthroplasty  Date of Surgery:  Clearance 08/21/22                                 Surgeon:  Dr. Paralee Cancel Surgeon's Group or Practice Name:  [facility] Phone number:  [phone] Fax number:  [phone]   Type of Clearance Requested:   - Medical    Type of Anesthesia:  Spinal   Additional requests/questions:  Please advise surgeon/provider what medications should be held.  Hervey Ard   08/10/2022, 8:24 AM

## 2022-08-12 NOTE — Patient Instructions (Signed)
SURGICAL WAITING ROOM VISITATION Patients having surgery or a procedure may have no more than 2 support people in the waiting area - these visitors may rotate in the visitor waiting room.   Due to an increase in RSV and influenza rates and associated hospitalizations, children ages 2 and under may not visit patients in [place]. If the patient needs to stay at the hospital during part of their recovery, the visitor guidelines for inpatient rooms apply.  PRE-OP VISITATION  Pre-op nurse will coordinate an appropriate time for 1 support person to accompany the patient in pre-op.  This support person may not rotate.  This visitor will be contacted when the time is appropriate for the visitor to come back in the pre-op area.  Please refer to the [facility] website for the visitor guidelines for Inpatients (after your surgery is over and you are in a regular room).  You are not required to quarantine at this time prior to your surgery. However, you must do this: Hand Hygiene often Do NOT share personal items Notify your provider if you are in close contact with someone who has COVID or you develop fever 100.4 or greater, new onset of sneezing, cough, sore throat, shortness of breath or body aches.  If you test positive for Covid or have been in contact with anyone that has tested positive in the last 10 days please notify you surgeon.    Your procedure is scheduled on:  Tuesday  August 21, 2022  Report to [facility] Main Entrance: [place] entrance where the [employer] is available.   Report to admitting at: 11:45  AM  +++++Call this number if you have any questions or problems the morning of surgery [phone]  Do not eat food after Midnight the night prior to your surgery/procedure.  After Midnight you may have the following liquids until  11:15 AM  DAY OF SURGERY  Clear Liquid Diet Water Black Coffee (sugar ok, NO MILK/CREAM OR CREAMERS)  Tea (sugar ok, NO  MILK/CREAM OR CREAMERS) regular and decaf                             Plain Jell-O  with no fruit (NO RED)                                           Fruit ices (not with fruit pulp, NO RED)                                     Popsicles (NO RED)                                                                  Juice: apple, WHITE grape, WHITE cranberry Sports drinks like Gatorade or Powerade (NO RED)                     The day of surgery:  Drink ONE (1) Pre-Surgery G2 at  11:15 AM the morning of surgery. Drink in one sitting. Do  not sip.  This drink was given to you during your hospital pre-op appointment visit. Nothing else to drink after completing the Pre-SurgeryG2 : No candy, chewing gum or throat lozenges.    FOLLOW ANY ADDITIONAL PRE OP INSTRUCTIONS YOU RECEIVED FROM YOUR SURGEON'S OFFICE!!!   Oral Hygiene is also important to reduce your risk of infection.        Remember - BRUSH YOUR TEETH THE MORNING OF SURGERY WITH YOUR REGULAR TOOTHPASTE  Do NOT smoke after Midnight the night before surgery.  Take ONLY these medicines the morning of surgery with A SIP OF WATER: Loratadine (Claritin), Amlodipine. You may take: Tylenol and omeprazole (Prilosec) if needed. You may use your Albuterol inhaler, DuoNeb, and Flonase nasal spray if needed.   You may not have any metal on your body including jewelry, and body piercing  Do not wear  lotions, powders, cologne, or deodorant  Men may shave face and neck.  Contacts, Hearing Aids, dentures or bridgework may not be worn into surgery. DENTURES WILL BE REMOVED PRIOR TO SURGERY PLEASE DO NOT APPLY "Poly grip" OR ADHESIVES!!!  You may bring a small overnight bag with you on the day of surgery, only pack items that are not valuable. [facility] IS NOT RESPONSIBLE   FOR VALUABLES THAT ARE LOST OR STOLEN.   Do not bring your home medications to the hospital. The Pharmacy will dispense medications listed on your medication list to you during  your admission in the Hospital.  Special Instructions: Bring a copy of your healthcare power of attorney and living will documents the day of surgery, if you wish to have them scanned into your [facility] Records- EPIC  Please read over the following fact sheets you were given: IF YOU HAVE QUESTIONS ABOUT YOUR PRE-OP INSTRUCTIONS, PLEASE CALL [phone]  ([place])   [place] - Preparing for Surgery Before surgery, you can play an important role.  Because skin is not sterile, your skin needs to be as free of germs as possible.  You can reduce the number of germs on your skin by washing with CHG (chlorahexidine gluconate) soap before surgery.  CHG is an antiseptic cleaner which kills germs and bonds with the skin to continue killing germs even after washing. Please DO NOT use if you have an allergy to CHG or antibacterial soaps.  If your skin becomes reddened/irritated stop using the CHG and inform your nurse when you arrive at Short Stay. Do not shave (including legs and underarms) for at least 48 hours prior to the first CHG shower.  You may shave your face/neck.  Please follow these instructions carefully:  1.  Shower with CHG Soap the night before surgery and the  morning of surgery.  2.  If you choose to wash your hair, wash your hair first as usual with your normal  shampoo.  3.  After you shampoo, rinse your hair and body thoroughly to remove the shampoo.                             4.  Use CHG as you would any other liquid soap.  You can apply chg directly to the skin and wash.  Gently with a scrungie or clean washcloth.  5.  Apply the CHG Soap to your body ONLY FROM THE NECK DOWN.   Do not use on face/ open  Wound or open sores. Avoid contact with eyes, ears mouth and genitals (private parts).                       Wash face,  Genitals (private parts) with your normal soap.             6.  Wash thoroughly, paying special attention to the area where your   surgery  will be performed.  7.  Thoroughly rinse your body with warm water from the neck down.  8.  DO NOT shower/wash with your normal soap after using and rinsing off the CHG Soap.            9.  Pat yourself dry with a clean towel.            10.  Wear clean pajamas.            11.  Place clean sheets on your bed the night of your first shower and do not  sleep with pets.  ON THE DAY OF SURGERY : Do not apply any lotions/deodorants the morning of surgery.  Please wear clean clothes to the hospital/surgery center.    FAILURE TO FOLLOW THESE INSTRUCTIONS MAY RESULT IN THE CANCELLATION OF YOUR SURGERY  PATIENT SIGNATURE_________________________________  NURSE SIGNATURE__________________________________  ________________________________________________________________________        Travis Blanchard    An incentive spirometer is a tool that can help keep your lungs clear and active. This tool measures how well you are filling your lungs with each breath. Taking long deep breaths may help reverse or decrease the chance of developing breathing (pulmonary) problems (especially infection) following: A long period of time when you are unable to move or be active. BEFORE THE PROCEDURE  If the spirometer includes an indicator to show your best effort, your nurse or respiratory therapist will set it to a desired goal. If possible, sit up straight or lean slightly forward. Try not to slouch. Hold the incentive spirometer in an upright position. INSTRUCTIONS FOR USE  Sit on the edge of your bed if possible, or sit up as far as you can in bed or on a chair. Hold the incentive spirometer in an upright position. Breathe out normally. Place the mouthpiece in your mouth and seal your lips tightly around it. Breathe in slowly and as deeply as possible, raising the piston or the ball toward the top of the column. Hold your breath for 3-5 seconds or for as long as possible. Allow the  piston or ball to fall to the bottom of the column. Remove the mouthpiece from your mouth and breathe out normally. Rest for a few seconds and repeat Steps 1 through 7 at least 10 times every 1-2 hours when you are awake. Take your time and take a few normal breaths between deep breaths. The spirometer may include an indicator to show your best effort. Use the indicator as a goal to work toward during each repetition. After each set of 10 deep breaths, practice coughing to be sure your lungs are clear. If you have an incision (the cut made at the time of surgery), support your incision when coughing by placing a pillow or rolled up towels firmly against it. Once you are able to get out of bed, walk around indoors and cough well. You may stop using the incentive spirometer when instructed by your caregiver.  RISKS AND COMPLICATIONS Take your time so you do not get dizzy or light-headed. If you  are in pain, you may need to take or ask for pain medication before doing incentive spirometry. It is harder to take a deep breath if you are having pain. AFTER USE Rest and breathe slowly and easily. It can be helpful to keep track of a log of your progress. Your caregiver can provide you with a simple table to help with this. If you are using the spirometer at home, follow these instructions: [place] IF:  You are having difficultly using the spirometer. You have trouble using the spirometer as often as instructed. Your pain medication is not giving enough relief while using the spirometer. You develop fever of 100.5 F (38.1 C) or higher.                                                                                                    SEEK IMMEDIATE MEDICAL CARE IF:  You cough up bloody sputum that had not been present before. You develop fever of 102 F (38.9 C) or greater. You develop worsening pain at or near the incision site. MAKE SURE YOU:  Understand these instructions. Will watch  your condition. Will get help right away if you are not doing well or get worse. Document Released: 11/05/2006 Document Revised: 09/17/2011 Document Reviewed: 01/06/2007 [facility] Patient Information 2014 [place], [place]. .  .  WHAT IS A BLOOD TRANSFUSION? Blood Transfusion Information  A transfusion is the replacement of blood or some of its parts. Blood is made up of multiple cells which provide different functions. Red blood cells carry oxygen and are used for blood loss replacement. White blood cells fight against infection. Platelets control bleeding. Plasma helps clot blood. Other blood products are available for specialized needs, such as hemophilia or other clotting disorders. BEFORE THE TRANSFUSION  Who gives blood for transfusions?  Healthy volunteers who are fully evaluated to make sure their blood is safe. This is blood bank blood. Transfusion therapy is the safest it has ever been in the practice of medicine. Before blood is taken from a donor, a complete history is taken to make sure that person has no history of diseases nor engages in risky social behavior (examples are intravenous drug use or sexual activity with multiple partners). The donor's travel history is screened to minimize risk of transmitting infections, such as malaria. The donated blood is tested for signs of infectious diseases, such as HIV and hepatitis. The blood is then tested to be sure it is compatible with you in order to minimize the chance of a transfusion reaction. If you or a relative donates blood, this is often done in anticipation of surgery and is not appropriate for emergency situations. It takes many days to process the donated blood. RISKS AND COMPLICATIONS Although transfusion therapy is very safe and saves many lives, the main dangers of transfusion include:  Getting an infectious disease. Developing a transfusion reaction. This is an allergic reaction to something in the blood you were given.  Every precaution is taken to prevent this. The decision to have a blood transfusion has been considered carefully by your caregiver  before blood is given. Blood is not given unless the benefits outweigh the risks. AFTER THE TRANSFUSION Right after receiving a blood transfusion, you will usually feel much better and more energetic. This is especially true if your red blood cells have gotten low (anemic). The transfusion raises the level of the red blood cells which carry oxygen, and this usually causes an energy increase. The nurse administering the transfusion will monitor you carefully for complications. HOME CARE INSTRUCTIONS  No special instructions are needed after a transfusion. You may find your energy is better. Speak with your caregiver about any limitations on activity for underlying diseases you may have. SEEK MEDICAL CARE IF:  Your condition is not improving after your transfusion. You develop redness or irritation at the intravenous (IV) site. SEEK IMMEDIATE MEDICAL CARE IF:  Any of the following symptoms occur over the next 12 hours: Shaking chills. You have a temperature by mouth above 102 F (38.9 C), not controlled by medicine. Chest, back, or muscle pain. People around you feel you are not acting correctly or are confused. Shortness of breath or difficulty breathing. Dizziness and fainting. You get a rash or develop hives. You have a decrease in urine output. Your urine turns a dark color or changes to pink, red, or brown. Any of the following symptoms occur over the next 10 days: You have a temperature by mouth above 102 F (38.9 C), not controlled by medicine. Shortness of breath. Weakness after normal activity. The white part of the eye turns yellow (jaundice). You have a decrease in the amount of urine or are urinating less often. Your urine turns a dark color or changes to pink, red, or brown. Document Released: 06/22/2000 Document Revised: 09/17/2011 Document  Reviewed: 02/09/2008 [facility] Patient Information 2014 [place], [place].  _______________________________________________________________________

## 2022-08-12 NOTE — Progress Notes (Signed)
COVID Vaccine received:  '[]'$  No '[x]'$  Yes Date of any COVID positive Test in last 90 days: None  PCP - Rosita Fire, MD, Josephine Cables, NP Cardiologist - Minus Breeding, MD   Chest x-ray - 12-18-2018 epic EKG- 10-05-2021  epic Stress Test - 10-24-2021  epic ECHO -  Cardiac Cath -   PCR screen: '[x]'$  Ordered & Completed                      '[]'$   No Order but Needs PROFEND                      '[]'$   N/A for this surgery  Surgery Plan:  '[]'$  Ambulatory                            '[x]'$  Outpatient in bed                            '[]'$  Admit  Anesthesia:    '[]'$  General  '[x]'$  Spinal                           '[]'$   Choice '[]'$   MAC   Pacemaker / ICD device '[x]'$  No '[]'$  Yes        Device order form faxed '[x]'$  No    '[]'$   Yes      Faxed to:  Spinal Cord Stimulator:'[x]'$  No '[]'$  Yes      (Remind patient to bring remote DOS) Other Implants:   History of Sleep Apnea? '[x]'$  No '[]'$  Yes   CPAP used?- '[x]'$  No '[]'$  Yes    Does the patient monitor blood sugar? '[]'$  No '[x]'$  Yes  '[]'$  N/A     no Medicine  Patient has: '[x]'$  Pre-DM   '[]'$  DM1  '[]'$   DM2 Does patient have a [employer] or Dexacom? '[]'$  No '[]'$  Yes   Fasting Blood Sugar Ranges- 95-110   Checks Blood Sugar _3  times a week Last A1c? 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  Activity level: Patient can climb a flight of stairs without difficulty; '[x]'$  No CP  '[x]'$  No SOB, but would have severe leg pain.   Patient can perform ADLs without assistance.   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 He states that at [facility], he was placed in a room, naked, with only a sheet over him for a long time. After a while  a man came in an put a needle in his back but he had no sedation prior. The patient states that he jumped around d/t pain from the needle. He is afraid that we will not have him fully sedated before starting the spinal anesthesia. He has talked to Dr. Alvan Dame about this fear.   Patient denies shortness of breath, fever, cough and chest pain at PAT appointment.  Patient verbalized understanding and agreement to the Pre-Surgical Instructions that were given to them at this PAT appointment. Patient  was also educated of the need to review these PAT instructions again prior to his/her surgery.I reviewed the appropriate phone numbers to call if they have any and questions or concerns.

## 2022-08-13 ENCOUNTER — Encounter (HOSPITAL_COMMUNITY): Payer: Self-pay

## 2022-08-13 ENCOUNTER — Other Ambulatory Visit: Payer: Self-pay

## 2022-08-13 ENCOUNTER — Encounter (HOSPITAL_COMMUNITY)
Admission: RE | Admit: 2022-08-13 | Discharge: 2022-08-13 | Disposition: A | Discharge status: Home / Self Care | Payer: Medicare Other | Source: Ambulatory Visit | Attending: Orthopedic Surgery | Admitting: Orthopedic Surgery

## 2022-08-13 VITALS — BP 129/69 | HR 72 | Temp 98.6°F | Resp 18 | Ht 71.0 in | Wt 259.0 lb

## 2022-08-13 DIAGNOSIS — Z01812 Encounter for preprocedural laboratory examination: Secondary | ICD-10-CM | POA: Insufficient documentation

## 2022-08-13 DIAGNOSIS — I10 Essential (primary) hypertension: Secondary | ICD-10-CM | POA: Diagnosis not present

## 2022-08-13 DIAGNOSIS — M1611 Unilateral primary osteoarthritis, right hip: Secondary | ICD-10-CM | POA: Diagnosis not present

## 2022-08-13 DIAGNOSIS — E119 Type 2 diabetes mellitus without complications: Secondary | ICD-10-CM | POA: Diagnosis not present

## 2022-08-13 DIAGNOSIS — Z87891 Personal history of nicotine dependence: Secondary | ICD-10-CM | POA: Diagnosis not present

## 2022-08-13 DIAGNOSIS — J45909 Unspecified asthma, uncomplicated: Secondary | ICD-10-CM | POA: Diagnosis not present

## 2022-08-13 DIAGNOSIS — E785 Hyperlipidemia, unspecified: Secondary | ICD-10-CM | POA: Insufficient documentation

## 2022-08-13 DIAGNOSIS — Z01818 Encounter for other preprocedural examination: Secondary | ICD-10-CM

## 2022-08-13 DIAGNOSIS — R7303 Prediabetes: Secondary | ICD-10-CM

## 2022-08-13 HISTORY — DX: Other complications of anesthesia, initial encounter: T88.59XA

## 2022-08-13 LAB — CBC
HCT: 47.9 % (ref 39.0–52.0)
Hemoglobin: 15.4 g/dL (ref 13.0–17.0)
MCH: 29.3 pg (ref 26.0–34.0)
MCHC: 32.2 g/dL (ref 30.0–36.0)
MCV: 91.1 fL (ref 80.0–100.0)
Platelets: 211 K/uL (ref 150–400)
RBC: 5.26 MIL/uL (ref 4.22–5.81)
RDW: 12.4 % (ref 11.5–15.5)
WBC: 7.9 K/uL (ref 4.0–10.5)
nRBC: 0 % (ref 0.0–0.2)

## 2022-08-13 LAB — BASIC METABOLIC PANEL
Anion gap: 11 (ref 5–15)
BUN: 11 mg/dL (ref 8–23)
CO2: 25 mmol/L (ref 22–32)
Calcium: 9 mg/dL (ref 8.9–10.3)
Chloride: 104 mmol/L (ref 98–111)
Creatinine, Ser: 0.9 mg/dL (ref 0.61–1.24)
GFR, Estimated: >60 mL/min (ref >60)
Glucose, Bld: 95 mg/dL (ref 70–99)
Potassium: 3.9 mmol/L (ref 3.5–5.1)
Sodium: 140 mmol/L (ref 135–145)

## 2022-08-13 LAB — HEMOGLOBIN A1C
Hgb A1c MFr Bld: 5.7 % — ABNORMAL HIGH (ref 4.8–5.6)
Mean Plasma Glucose: 116.89 mg/dL

## 2022-08-13 LAB — GLUCOSE, CAPILLARY: Glucose-Capillary: 89 mg/dL (ref 70–99)

## 2022-08-13 NOTE — Telephone Encounter (Signed)
   Name: ATHARV BARRIERE  DOB: [birth year]  MRN: [id number]  Primary Cardiologist: None  Chart reviewed as part of pre-operative protocol coverage. Because of Dwayn Moravek Holzmann's past medical history and time since last visit, he will require a follow-up in-office visit in order to better assess preoperative cardiovascular risk.  Pre-op covering staff: - Please schedule appointment and call patient to inform them. If patient already had an upcoming appointment within acceptable timeframe, please add "pre-op clearance" to the appointment notes so provider is aware. - Please contact requesting surgeon's office via preferred method (i.e, phone, fax) to inform them of need for appointment prior to surgery.  Lenna Sciara, NP  08/13/2022, 10:58 AM

## 2022-08-13 NOTE — Telephone Encounter (Signed)
Pt has been scheduled to see Coletta Memos, FNP 08/17/22 @ 8:50 pre op clearance.

## 2022-08-14 LAB — SURGICAL PCR SCREEN
MRSA, PCR: POSITIVE — AB
Staphylococcus aureus: POSITIVE — AB

## 2022-08-14 NOTE — Progress Notes (Addendum)
Anesthesia Chart Review   Case: [id number] Date/Time: 08/21/22 1405   Procedure: TOTAL HIP ARTHROPLASTY ANTERIOR APPROACH (Right: Hip)   Anesthesia type: Spinal   Pre-op diagnosis: Right hip osteoarthritis   Location: [facility] ROOM 10 / [facility]   Surgeons: Paralee Cancel, MD       DISCUSSION:76 y.o. former smoker with h/o DM II, HTN, asthma, right hip OA scheduled for above procedure 08/21/22 with Dr. Paralee Cancel.   Cardiology visit for preoperative evaluation 08/17/2022.   Low risk stress test 10/24/2021.   Addendum 08/20/22:  Pt seen by cardiology 08/17/22. Per OV note, "Chart reviewed as part of pre-operative protocol coverage. Given past medical history and time since last visit, based on ACC/AHA guidelines, GEOGGREY GRESH would be at acceptable risk for the planned procedure without further cardiovascular testing.    His RCRI is a class I risk, 0.4% risk of major cardiac event.  He is able to complete greater than 4 METS of physical activity."  Anticipate pt can proceed with planned procedure barring acute status change.   VS: BP 129/69 Comment: right arm sitting  Pulse 72   Temp 37 C (Oral)   Resp 18   Ht 5' 11"$  (1.803 m)   Wt 117.5 kg   SpO2 100%   BMI 36.12 kg/m   PROVIDERS: Carrolyn Meiers, MD is PCP   Primary Cardiologist: Minus Breeding, MD  LABS: Labs reviewed: Acceptable for surgery. (all labs ordered are listed, but only abnormal results are displayed)  Labs Reviewed  SURGICAL PCR SCREEN - Abnormal; Notable for the following components:      Result Value   MRSA, PCR POSITIVE (*)    Staphylococcus aureus POSITIVE (*)    All other components within normal limits  HEMOGLOBIN A1C - Abnormal; Notable for the following components:   Hgb A1c MFr Bld 5.7 (*)    All other components within normal limits  BASIC METABOLIC PANEL  CBC  GLUCOSE, CAPILLARY  TYPE AND SCREEN     IMAGES:   EKG:   CV: Myocardial Perfusion 10/24/2021   The study is normal. The  study is low risk.   No ST deviation was noted.   LV perfusion is normal. There is no evidence of ischemia. There is no evidence of infarction.   Left ventricular function is normal. Nuclear stress EF: 59 %. The left ventricular ejection fraction is normal (55-65%). End diastolic cavity size is normal. End systolic cavity size is normal.   Prior study not available for comparison.   Fixed inferior perfusion defect with normal wall motion consistent with artifact Low risk study Past Medical History:  Diagnosis Date   Arthritis    Asthma    B12 deficiency    Cancer ([place])    Prostate   Complication of anesthesia    had spinal without patient being sedated at all. done at [facility]   COPD (chronic obstructive pulmonary disease) ([place])    Diabetes mellitus without complication ([place])    Hyperlipidemia    Hypertension    Umbilical hernia    Vitamin D deficiency     Past Surgical History:  Procedure Laterality Date   COLONOSCOPY  2008   Dr. Gala Romney: 2 diminutive polyps in rectum (hyperplastic), otherwise normal   COLONOSCOPY  2000   Per notes in epic, history of adenomas at outside facility   COLONOSCOPY WITH PROPOFOL N/A 07/18/2017   Procedure: COLONOSCOPY WITH PROPOFOL;  Surgeon: Daneil Dolin, MD;  [facility];  Service: Endoscopy;  Laterality: N/A;  8:45   HEMORRHOID SURGERY     age 53   KNEE ARTHROSCOPY Right 1985   KNEE ARTHROSCOPY Right 1990   LAPAROSCOPIC GASTRIC SLEEVE RESECTION N/A 03/06/2022   Procedure: LAPAROSCOPIC SLEEVE GASTRECTOMY;  Surgeon: Greer Pickerel, MD;  Location: [facility];  Service: General;  Laterality: N/A;   PROSTATE SURGERY  2000   Prostate removal/Cancer   UPPER GI ENDOSCOPY N/A 03/06/2022   Procedure: UPPER GI ENDOSCOPY;  Surgeon: Greer Pickerel, MD;  Location: [facility];  Service: General;  Laterality: N/A;    MEDICATIONS:  acetaminophen (TYLENOL) 325 MG tablet   albuterol (VENTOLIN HFA) 108 (90 Base) MCG/ACT inhaler   amLODipine (NORVASC) 5 MG  tablet   atorvastatin (LIPITOR) 20 MG tablet   CALCIUM-VITAMIN D PO   Cholecalciferol (VITAMIN D3) 1000 units CAPS   enoxaparin (LOVENOX) 40 MG/0.4ML injection   fluticasone (FLONASE) 50 MCG/ACT nasal spray   ipratropium-albuterol (DUONEB) 0.5-2.5 (3) MG/3ML SOLN   loratadine (CLARITIN) 10 MG tablet   Multiple Vitamins-Minerals (BARIATRIC MULTIVITAMINS/IRON PO)   omeprazole (PRILOSEC) 20 MG capsule   ondansetron (ZOFRAN-ODT) 4 MG disintegrating tablet   psyllium (METAMUCIL) 58.6 % powder   traMADol (ULTRAM) 50 MG tablet   TURMERIC PO   vitamin B-12 (CYANOCOBALAMIN) 1000 MCG tablet   zolpidem (AMBIEN) 10 MG tablet   No current facility-administered medications for this encounter.    Konrad Felix [place], PA-C [facility] Pre-Surgical Testing [phone]

## 2022-08-14 NOTE — Progress Notes (Signed)
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 Appropriate notes have been placed on the patient's chart. This note has been routed to Dr. Alvan Dame and Costella Hatcher, PA for review. The Patient's surgery is currently scheduled for: 08-21-2022 at [facility].  Leota Jacobsen, BSN, CVRN-BC   Pre-Surgical Testing Nurse [place]  [phone]

## 2022-08-16 ENCOUNTER — Encounter: Payer: Medicare Other | Attending: General Surgery | Admitting: Skilled Nursing Facility1

## 2022-08-16 ENCOUNTER — Encounter: Payer: Self-pay | Admitting: Skilled Nursing Facility1

## 2022-08-16 DIAGNOSIS — J449 Chronic obstructive pulmonary disease, unspecified: Secondary | ICD-10-CM | POA: Diagnosis not present

## 2022-08-16 DIAGNOSIS — Z713 Dietary counseling and surveillance: Secondary | ICD-10-CM | POA: Diagnosis not present

## 2022-08-16 DIAGNOSIS — E785 Hyperlipidemia, unspecified: Secondary | ICD-10-CM | POA: Insufficient documentation

## 2022-08-16 DIAGNOSIS — E119 Type 2 diabetes mellitus without complications: Secondary | ICD-10-CM | POA: Diagnosis not present

## 2022-08-16 DIAGNOSIS — I10 Essential (primary) hypertension: Secondary | ICD-10-CM | POA: Insufficient documentation

## 2022-08-16 DIAGNOSIS — Z8546 Personal history of malignant neoplasm of prostate: Secondary | ICD-10-CM | POA: Diagnosis not present

## 2022-08-16 NOTE — Progress Notes (Signed)
Bariatric Nutrition Follow-Up Visit Medical Nutrition Therapy   Surgery date: 03/06/2022 Surgery type: sleeve  NUTRITION ASSESSMENT    Anthropometrics  Anthropometrics  Start weight at [facility]: 288 lbs (date: 12/22/2020)  Weight: pt declined  Clinical  Medical hx: prostate cancer, diabetes, HTN, hyperlipidemia, COPD, gastric ulcer Medications: amlodipine, as needed omperezole, b12, ambien Labs: Notable signs/symptoms: none identified  Any previous deficiencies? Yes, vitmain B12 Bowel Habits: Every day to every other day no complaints   Lifestyle & Dietary Hx  Pt states he is feeling much better today and has been doing PT and feels that is going great and will be getting a new hip next week! Pt states he is still on his routine eating his salads and meats and avoiding too much deli meat eating honey baked [place] and ham.  Pt states his blood sugars have been about 90-120.  Pt states he has gotten off the simple carbs.    Estimated daily fluid intake: 46 oz Estimated daily protein intake: 80+ g Supplements: multivitamin and calcium Current average weekly physical activity: PT  24-Hr Dietary Recall First Meal 10:30-11: 1 packet oatmeal + berries + cinnamon  Snack:  nuts Second Meal: 1-2 chicken thighs with raw broccoli/caulofwer/onion or tuna Snack:   Third Meal: chicken, shrimp, steak or ribs + peppers + onions + oil + broccoli + cauliflower + celery + salad dressing and french fries Snack:  Beverages: coffee, water  Post-Op Goals/ Signs/ Symptoms Using straws: no Drinking while eating: no Chewing/swallowing difficulties: no Changes in vision: no Changes to mood/headaches: no Hair loss/changes to skin/nails: no Difficulty focusing/concentrating: no Sweating: no Limb weakness: no Dizziness/lightheadedness: no Palpitations: no  Carbonated/caffeinated beverages: no N/V/D/C/Gas: no; taking metamucil daily Abdominal pain: no Dumping syndrome: no    NUTRITION  DIAGNOSIS  Overweight/obesity ([place]-3.3) related to past poor dietary habits and physical inactivity as evidenced by completed bariatric surgery and following dietary guidelines for continued weight loss and healthy nutrition status.     NUTRITION INTERVENTION: cotninued Nutrition counseling (C-1) and education (E-2) to facilitate bariatric surgery goals, including: The importance of consuming adequate calories as well as certain nutrients daily due to the body's need for essential vitamins, minerals, and fats The importance of daily physical activity and to reach a goal of at least 150 minutes of moderate to vigorous physical activity weekly (or as directed by their physician) due to benefits such as increased musculature and improved lab values The importance of intuitive eating specifically learning hunger-satiety cues and understanding the importance of learning a new body: The importance of mindful eating to avoid grazing behaviors  Encouraged patient to honor their body's internal hunger and fullness cues.  Throughout the day, check in mentally and rate hunger. Stop eating when satisfied not full regardless of how much food is left on the plate.  Get more if still hungry 20-30 minutes later.  The key is to honor satisfaction so throughout the meal, rate fullness factor and stop when comfortably satisfied not physically full. The key is to honor hunger and fullness without any feelings of guilt or shame.  Pay attention to what the internal cues are, rather than any external factors. This will enhance the confidence you have in listening to your own body and following those internal cues enabling you to increase how often you eat when you are hungry not out of appetite and stop when you are satisfied not full.  Encouraged pt to continue to eat balanced meals inclusive of non starchy vegetables 2  times a day 7 days a week Encouraged pt to choose lean protein sources: limiting beef, pork, sausage,  hotdogs, and lunch meat Encourage pt to choose healthy fats such as plant based limiting animal fats Encouraged pt to continue to drink a minium 64 fluid ounces with half being plain water to satisfy proper hydration  -watch those limits: limit yourself to 1/4 cup per day To have the fastest weight loss completely avoid eating sweets   Learning Style & Readiness for Change Teaching method utilized: Visual & Auditory  Demonstrated degree of understanding via: Teach Back  Readiness Level: action Barriers to learning/adherence to lifestyle change: none currently   RD's Notes for Next Visit Assess adherence to pt chosen goals    MONITORING & EVALUATION Dietary intake, weekly physical activity, body weight  Next Steps Patient is to follow-up in 4 months

## 2022-08-16 NOTE — Progress Notes (Signed)
Cardiology Clinic Note   Patient Name: Travis Blanchard Date of Encounter: 08/17/2022  Primary Care Provider:  Carrolyn Meiers, MD Primary Cardiologist:  Minus Breeding, MD  Patient Profile    Travis Blanchard 77 year old male presents the clinic today for follow-up evaluation of his essential hypertension palpitations, and preoperative cardiac evaluation.  Past Medical History    Past Medical History:  Diagnosis Date   Arthritis    Asthma    B12 deficiency    Cancer [facility])    Prostate   Complication of anesthesia    had spinal without patient being sedated at all. done at [facility]   COPD (chronic obstructive pulmonary disease) ([place])    Diabetes mellitus without complication ([place])    Hyperlipidemia    Hypertension    Umbilical hernia    Vitamin D deficiency    Past Surgical History:  Procedure Laterality Date   COLONOSCOPY  2008   Dr. Gala Romney: 2 diminutive polyps in rectum (hyperplastic), otherwise normal   COLONOSCOPY  2000   Per notes in epic, history of adenomas at outside facility   COLONOSCOPY WITH PROPOFOL N/A 07/18/2017   Procedure: COLONOSCOPY WITH PROPOFOL;  Surgeon: Daneil Dolin, MD;  Location: [facility];  Service: Endoscopy;  Laterality: N/A;  8:45   HEMORRHOID SURGERY     age 26   KNEE ARTHROSCOPY Right 1985   KNEE ARTHROSCOPY Right 1990   LAPAROSCOPIC GASTRIC SLEEVE RESECTION N/A 03/06/2022   Procedure: LAPAROSCOPIC SLEEVE GASTRECTOMY;  Surgeon: Greer Pickerel, MD;  Location: [facility];  Service: General;  Laterality: N/A;   PROSTATE SURGERY  2000   Prostate removal/Cancer   UPPER GI ENDOSCOPY N/A 03/06/2022   Procedure: UPPER GI ENDOSCOPY;  Surgeon: Greer Pickerel, MD;  Location: [facility];  Service: General;  Laterality: N/A;    Allergies  Allergies  Allergen Reactions   Lisinopril Swelling    Facial swelling   Penicillins Anaphylaxis, Hives, Shortness Of Breath, Itching and Other (See Comments)    Has patient had a PCN reaction causing  immediate rash, facial/tongue/throat swelling, SOB or lightheadedness with hypotension: Yes Has patient had a PCN reaction causing severe rash involving mucus membranes or skin necrosis: No Has patient had a PCN reaction that required hospitalization: No Has patient had a PCN reaction occurring within the last 10 years: No If all of the above answers are "NO", then may proceed with Cephalosporin use.     History of Present Illness    Travis Blanchard has a PMH of HTN, type 2 diabetes, incisional hernia, HLD, shortness of breath, palpitations, and laparoscopic sleeve gastrectomy.  He was seen by Dr. Percival Spanish for evaluation of shortness of breath and preoperative cardiac evaluation prior to his bariatric surgery.  He was referred by Dr. Redmond Pulling.  His cardiac risk factors were noted to be diabetes and hypertension.  He was limited in his physical activity due to hip and knee pain.  He was able to do some slow walking from his house to his mailbox.  He reported some shortness of breath with the activity but denied chest discomfort.  He denied palpitations presyncope and syncope.  He had not had any weight gain or swelling.  His blood pressure was well-controlled at 138/76 and his EKG showed normal sinus rhythm.  He was felt to be moderate risk for surgery and stress testing was ordered which showed an EF of 59% and low risk.  He presents to the clinic today for follow-up evaluation  and states he feels well.  He has been doing physical therapy 3 times per week in anticipation of his upcoming hip surgery.  He reports that he tolerated his bariatric surgery well.  He is enjoying retirement.  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  I will have him increase his physical activity as tolerated, give the salty 6 diet sheet and plan follow-up in 12 months.  Today he denies chest pain, shortness of breath, lower extremity edema, fatigue, palpitations, melena, hematuria, hemoptysis, diaphoresis,  weakness, presyncope, syncope, orthopnea, and PND.     Home Medications    Prior to Admission medications   Medication Sig Start Date End Date Taking? Authorizing Provider  acetaminophen (TYLENOL) 325 MG tablet Take 650 mg by mouth every 6 (six) hours as needed for moderate pain.    [provider]  albuterol (VENTOLIN HFA) 108 (90 Base) MCG/ACT inhaler Inhale 1 puff into the lungs every 6 (six) hours as needed for wheezing or shortness of breath. 11/10/18   Jearld Lesch A, DO  amLODipine (NORVASC) 5 MG tablet Take 5 mg by mouth daily. 08/29/20   [provider]  atorvastatin (LIPITOR) 20 MG tablet Take 20 mg by mouth daily.  03/04/13   [provider]  CALCIUM-VITAMIN D PO Take 1 tablet by mouth in the morning and at bedtime.    [provider]  Cholecalciferol (VITAMIN D3) 1000 units CAPS Take 1,000 Units by mouth daily.    [provider]  enoxaparin (LOVENOX) 40 MG/0.4ML injection Inject 0.4 mLs (40 mg total) into the skin every 12 (twelve) hours for 14 days. Patient not taking: Reported on 08/10/2022 03/07/22 03/21/22  Greer Pickerel, MD  fluticasone [facility]) 50 MCG/ACT nasal spray Place 1 spray into both nostrils daily as needed for allergies.    [provider]  ipratropium-albuterol (DUONEB) 0.5-2.5 (3) MG/3ML SOLN Take 3 mLs by nebulization every 6 (six) hours as needed (shortness of breath).    [provider]  loratadine (CLARITIN) 10 MG tablet Take 10 mg by mouth daily.    [provider]  Multiple Vitamins-Minerals (BARIATRIC MULTIVITAMINS/IRON PO) Take 3 capsules by mouth daily.    [provider]  omeprazole (PRILOSEC) 20 MG capsule Take 20 mg by mouth daily as needed (acid reflux). 07/08/20   [provider]  ondansetron (ZOFRAN-ODT) 4 MG disintegrating tablet Dissolve 1 tablet (4 mg total) by mouth every 6 (six) hours as needed for nausea or vomiting. Patient not taking: Reported on 08/10/2022  03/07/22   Greer Pickerel, MD  psyllium (METAMUCIL) 58.6 % powder Take 0.5 packets by mouth at bedtime.    [provider]  traMADol (ULTRAM) 50 MG tablet Take 1 tablet (50 mg total) by mouth every 6 (six) hours as needed (pain). Patient not taking: Reported on 08/10/2022 03/07/22   Greer Pickerel, MD  TURMERIC PO Take 1 capsule by mouth daily.    [provider]  vitamin B-12 (CYANOCOBALAMIN) 1000 MCG tablet Take 1,000 mcg by mouth daily.    [provider]  zolpidem (AMBIEN) 10 MG tablet Take 10 mg by mouth at bedtime. 12/08/12   [provider]    Family History    Family History  Problem Relation Age of Onset   Diabetes Mother    Hypertension Mother    Hyperlipidemia Mother    Stroke Mother    Heart disease Mother    Depression Mother    Obesity Mother    Eating disorder  Mother    Cancer Father    Depression Father    Anxiety disorder Father    Liver disease Father    Alcoholism Father    Pancreatic cancer Father    Cancer Sister        Breast   Diabetes Sister    Colon cancer Neg Hx    Esophageal cancer Neg Hx    Inflammatory bowel disease Neg Hx    Rectal cancer Neg Hx    Stomach cancer Neg Hx    He indicated that his mother is deceased. He indicated that his father is deceased. He indicated that his sister is alive. He indicated that the status of his neg hx is unknown.  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     Social Determinants of Health   Financial Resource Strain: Not on file  Food Insecurity: Not on file  Transportation Needs: Not on file  Physical Activity: Not on file  Stress: Not on file  Social Connections: Not on file  Intimate Partner Violence: Not on file     Review of Systems    General:  No chills, fever, night sweats or weight changes.  Cardiovascular:  No chest pain, dyspnea on exertion, edema, orthopnea, palpitations, paroxysmal nocturnal dyspnea. Dermatological: No rash, lesions/masses Respiratory: No cough, dyspnea Urologic: No hematuria, dysuria Abdominal:   No nausea, vomiting, diarrhea, bright red blood per rectum, melena, or hematemesis Neurologic:  No visual changes, wkns, changes in mental status. All other systems reviewed and are otherwise negative except as noted above.  Physical Exam    VS:  BP 126/68   Pulse 71   Ht 5' 10"$  (1.778 m)   Wt 264 lb 6.4 oz (119.9 kg)   SpO2 98%   BMI 37.94 kg/m  , BMI Body mass index is 37.94 kg/m. GEN: Well nourished, well developed, in no acute distress. HEENT: normal. Neck: Supple, no JVD, carotid bruits, or masses. Cardiac: RRR, no murmurs, rubs, or gallops. No clubbing, cyanosis, edema.  Radials/DP/PT 2+ and equal bilaterally.  Respiratory:  Respirations regular and unlabored, clear to auscultation bilaterally. GI: Soft, nontender, nondistended, BS + x 4. MS: no deformity or atrophy. Skin: warm and dry, no rash. Neuro:  Strength and sensation are intact. Psych: Normal affect.  Accessory Clinical Findings    Recent Labs: 03/07/2022: ALT 22 08/13/2022: BUN 11; Creatinine, Ser 0.90; Hemoglobin 15.4; Platelets 211; Potassium 3.9; Sodium 140   Recent Lipid Panel    Component Value Date/Time   CHOL 170 03/25/2018 1041   TRIG 88 03/25/2018  1041   HDL 52 03/25/2018 1041  CHOLHDL 3.3 03/25/2018 1041   LDLCALC 100 (H) 03/25/2018 1041         ECG personally reviewed by me today-normal sinus rhythm no ectopy 71 bpm- No acute changes  Myocardial perfusion imaging 10/24/2021     The study is normal. The study is low risk.   No ST deviation was noted.   LV perfusion is normal. There is no evidence of ischemia. There is no evidence of infarction.   Left ventricular function is normal. Nuclear stress EF: 59 %. The left ventricular ejection fraction is normal (55-65%). End diastolic cavity size is normal. End systolic cavity size is normal.   Prior study not available for comparison.   Fixed inferior perfusion defect with normal wall motion consistent with artifact Low risk study  Assessment & Plan   1.  Essential hypertension-BP today 126/68 Continue amlodipine Heart healthy low-sodium diet-salty 6 given Increase physical activity as tolerated  Shortness of breath-has been improving with increased physical activity.  Underwent stress testing 2023 which showed low risk and normal EF. Increase physical activity as tolerated Heart healthy low-sodium diet   Preoperative cardiac evaluation-total hip arthroplasty, Dr. Paralee Cancel, 08/21/2022    Primary Cardiologist: Minus Breeding, MD  Chart reviewed as part of pre-operative protocol coverage. Given past medical history and time since last visit, based on ACC/AHA guidelines, Travis Blanchard would be at acceptable risk for the planned procedure without further cardiovascular testing.   His RCRI is a class I risk, 0.4% risk of major cardiac event.  He is able to complete greater than 4 METS of physical activity.  Patient was advised that if he develops new symptoms prior to surgery to contact our office to arrange a follow-up appointment.  He verbalized understanding.  I will route this recommendation to the requesting party via Epic fax function and remove from pre-op  pool.  Please call with questions.  Disposition: Follow-up with Dr. Percival Spanish or me in 1 year.     Travis Ng. Klark Vanderhoef NP-C     08/17/2022, 9:24 AM [place] [address] Office [phone] Fax [phone]    I spent 14 minutes examining this patient, reviewing medications, and using patient centered shared decision making involving her cardiac care.  Prior to her visit I spent greater than 20 minutes reviewing her past medical history,  medications, and prior cardiac tests.

## 2022-08-17 ENCOUNTER — Encounter: Payer: Self-pay | Admitting: General Practice

## 2022-08-17 ENCOUNTER — Ambulatory Visit: Payer: Medicare Other | Attending: General Practice | Admitting: General Practice

## 2022-08-17 VITALS — BP 126/68 | HR 71 | Ht 70.0 in | Wt 264.4 lb

## 2022-08-17 DIAGNOSIS — Z01810 Encounter for preprocedural cardiovascular examination: Secondary | ICD-10-CM | POA: Diagnosis not present

## 2022-08-17 DIAGNOSIS — I10 Essential (primary) hypertension: Secondary | ICD-10-CM

## 2022-08-17 DIAGNOSIS — R0602 Shortness of breath: Secondary | ICD-10-CM | POA: Diagnosis not present

## 2022-08-17 NOTE — Patient Instructions (Signed)
Medication Instructions:  Your physician recommends that you continue on your current medications as directed. Please refer to the Current Medication list given to you today.  *If you need a refill on your cardiac medications before your next appointment, please call your pharmacy*  Please increase physical activity as tolerated    Lab Work: NONE  If you have labs (blood work) drawn today and your tests are completely normal, you will receive your results only by: [place] (if you have MyChart) OR A paper copy in the mail If you have any lab test that is abnormal or we need to change your treatment, we will call you to review the results.   Testing/Procedures: NONE   Follow-Up: At [facility], you and your health needs are our priority.  As part of our continuing mission to provide you with exceptional heart care, we have created designated Provider Care Teams.  These Care Teams include your primary Cardiologist (physician) and Advanced Practice Providers (APPs -  Physician Assistants and Nurse Practitioners) who all work together to provide you with the care you need, when you need it.  We recommend signing up for the patient portal called "MyChart".  Sign up information is provided on this After Visit Summary.  MyChart is used to connect with patients for Virtual Visits (Telemedicine).  Patients are able to view lab/test results, encounter notes, upcoming appointments, etc.  Non-urgent messages can be sent to your provider as well.   To learn more about what you can do with MyChart, go to [email or web address].    Your next appointment:   1 year(s)  Provider:   Minus Breeding, MD

## 2022-08-20 NOTE — Anesthesia Preprocedure Evaluation (Signed)
Anesthesia Evaluation  Patient identified by MRN, date of birth, ID band Patient awake    Reviewed: Allergy & Precautions, NPO status , Patient's Chart, lab work & pertinent test results  Airway Mallampati: II  TM Distance: >3 FB Neck ROM: Full    Dental no notable dental hx.    Pulmonary asthma , COPD,  COPD inhaler, former smoker   Pulmonary exam normal        Cardiovascular hypertension, Pt. on medications Normal cardiovascular exam     Neuro/Psych negative neurological ROS  negative psych ROS   GI/Hepatic negative GI ROS, Neg liver ROS,,,  Endo/Other  diabetes    Renal/GU negative Renal ROS     Musculoskeletal  (+) Arthritis ,    Abdominal   Peds  Hematology negative hematology ROS (+)   Anesthesia Other Findings Right hip osteoarthritis  Reproductive/Obstetrics                              Anesthesia Physical Anesthesia Plan  ASA: 3  Anesthesia Plan: Spinal   Post-op Pain Management:    Induction: Intravenous  PONV Risk Score and Plan: 1 and Ondansetron, Dexamethasone, Propofol infusion and Treatment may vary due to age or medical condition  Airway Management Planned: Simple Face Mask  Additional Equipment:   Intra-op Plan:   Post-operative Plan:   Informed Consent: I have reviewed the patients History and Physical, chart, labs and discussed the procedure including the risks, benefits and alternatives for the proposed anesthesia with the patient or authorized representative who has indicated his/her understanding and acceptance.     Dental advisory given  Plan Discussed with: CRNA  Anesthesia Plan Comments: (PAT note 08/13/2022)        Anesthesia Quick Evaluation

## 2022-08-21 ENCOUNTER — Ambulatory Visit (HOSPITAL_COMMUNITY): Payer: Medicare Other

## 2022-08-21 ENCOUNTER — Other Ambulatory Visit: Payer: Self-pay

## 2022-08-21 ENCOUNTER — Ambulatory Visit (HOSPITAL_COMMUNITY): Payer: Medicare Other | Admitting: Physician Assistant

## 2022-08-21 ENCOUNTER — Encounter (HOSPITAL_COMMUNITY): Payer: Self-pay | Admitting: Orthopedic Surgery

## 2022-08-21 ENCOUNTER — Encounter (HOSPITAL_COMMUNITY)
Admission: RE | Disposition: A | Discharge status: Home / Self Care | Payer: Self-pay | Source: Ambulatory Visit | Attending: Orthopedic Surgery

## 2022-08-21 ENCOUNTER — Ambulatory Visit (HOSPITAL_BASED_OUTPATIENT_CLINIC_OR_DEPARTMENT_OTHER): Payer: Medicare Other | Admitting: Anesthesiology

## 2022-08-21 ENCOUNTER — Observation Stay (HOSPITAL_COMMUNITY)
Admission: RE | Admit: 2022-08-21 | Discharge: 2022-08-22 | Disposition: A | Discharge status: Home / Self Care | Payer: Medicare Other | Source: Ambulatory Visit | Attending: Orthopedic Surgery | Admitting: Orthopedic Surgery

## 2022-08-21 DIAGNOSIS — Z87891 Personal history of nicotine dependence: Secondary | ICD-10-CM | POA: Diagnosis not present

## 2022-08-21 DIAGNOSIS — Z8546 Personal history of malignant neoplasm of prostate: Secondary | ICD-10-CM | POA: Insufficient documentation

## 2022-08-21 DIAGNOSIS — M1611 Unilateral primary osteoarthritis, right hip: Secondary | ICD-10-CM | POA: Diagnosis not present

## 2022-08-21 DIAGNOSIS — J45909 Unspecified asthma, uncomplicated: Secondary | ICD-10-CM | POA: Insufficient documentation

## 2022-08-21 DIAGNOSIS — J449 Chronic obstructive pulmonary disease, unspecified: Secondary | ICD-10-CM | POA: Diagnosis not present

## 2022-08-21 DIAGNOSIS — Z96641 Presence of right artificial hip joint: Secondary | ICD-10-CM

## 2022-08-21 DIAGNOSIS — I10 Essential (primary) hypertension: Secondary | ICD-10-CM | POA: Diagnosis not present

## 2022-08-21 DIAGNOSIS — E119 Type 2 diabetes mellitus without complications: Secondary | ICD-10-CM | POA: Insufficient documentation

## 2022-08-21 DIAGNOSIS — Z471 Aftercare following joint replacement surgery: Secondary | ICD-10-CM | POA: Diagnosis not present

## 2022-08-21 DIAGNOSIS — Z79899 Other long term (current) drug therapy: Secondary | ICD-10-CM | POA: Diagnosis not present

## 2022-08-21 DIAGNOSIS — R7303 Prediabetes: Secondary | ICD-10-CM

## 2022-08-21 HISTORY — PX: TOTAL HIP ARTHROPLASTY: SHX124

## 2022-08-21 LAB — TYPE AND SCREEN
ABO/RH(D): O POS
Antibody Screen: NEGATIVE

## 2022-08-21 LAB — GLUCOSE, CAPILLARY
Glucose-Capillary: 128 mg/dL — ABNORMAL HIGH (ref 70–99)
Glucose-Capillary: 118 mg/dL — ABNORMAL HIGH (ref 70–99)

## 2022-08-21 SURGERY — ARTHROPLASTY, HIP, TOTAL, ANTERIOR APPROACH
Anesthesia: Spinal | Site: Hip | Laterality: Right

## 2022-08-21 MED ORDER — DOCUSATE SODIUM 100 MG PO CAPS
100.0000 mg | ORAL_CAPSULE | Freq: Two times a day (BID) | ORAL | Status: DC
  Administered 2022-08-21 – 2022-08-22 (×2): 100 mg via ORAL
  Filled 2022-08-21 (×2): qty 1

## 2022-08-21 MED ORDER — VANCOMYCIN HCL IN DEXTROSE 1-5 GM/200ML-% IV SOLN: 1000.0000 mg | Freq: Once | INTRAVENOUS | Status: DC

## 2022-08-21 MED ORDER — ONDANSETRON HCL 4 MG/2ML IJ SOLN: 4.0000 mg | Freq: Once | INTRAMUSCULAR | Status: DC | PRN

## 2022-08-21 MED ORDER — PROPOFOL 500 MG/50ML IV EMUL
INTRAVENOUS | Status: DC | PRN
  Administered 2022-08-21: 100 ug/kg/min via INTRAVENOUS

## 2022-08-21 MED ORDER — MORPHINE SULFATE (PF) 2 MG/ML IV SOLN: 0.5000 mg | INTRAVENOUS | Status: DC | PRN

## 2022-08-21 MED ORDER — ONDANSETRON HCL 4 MG PO TABS: 4.0000 mg | ORAL_TABLET | Freq: Four times a day (QID) | ORAL | Status: DC | PRN

## 2022-08-21 MED ORDER — BUPIVACAINE-EPINEPHRINE (PF) 0.25% -1:200000 IJ SOLN
INTRAMUSCULAR | Status: DC | PRN
  Administered 2022-08-21: 30 mL

## 2022-08-21 MED ORDER — LACTATED RINGERS IV SOLN: INTRAVENOUS | Status: DC

## 2022-08-21 MED ORDER — KETOROLAC TROMETHAMINE 30 MG/ML IJ SOLN
INTRAMUSCULAR | Status: AC
  Filled 2022-08-21: qty 1

## 2022-08-21 MED ORDER — STERILE WATER FOR IRRIGATION IR SOLN
Status: DC | PRN
  Administered 2022-08-21: 2000 mL

## 2022-08-21 MED ORDER — BUPIVACAINE HCL (PF) 0.25 % IJ SOLN
INTRAMUSCULAR | Status: AC
  Filled 2022-08-21: qty 30

## 2022-08-21 MED ORDER — ONDANSETRON HCL 4 MG/2ML IJ SOLN
INTRAMUSCULAR | Status: AC
  Filled 2022-08-21: qty 2

## 2022-08-21 MED ORDER — CHLORHEXIDINE GLUCONATE 0.12 % MT SOLN
15.0000 mL | Freq: Once | OROMUCOSAL | Status: AC
  Administered 2022-08-21: 15 mL via OROMUCOSAL

## 2022-08-21 MED ORDER — MENTHOL 3 MG MT LOZG: 1.0000 | LOZENGE | OROMUCOSAL | Status: DC | PRN

## 2022-08-21 MED ORDER — LORATADINE 10 MG PO TABS
10.0000 mg | ORAL_TABLET | Freq: Every day | ORAL | Status: DC
  Administered 2022-08-22: 10 mg via ORAL
  Filled 2022-08-21: qty 1

## 2022-08-21 MED ORDER — DEXAMETHASONE SODIUM PHOSPHATE 10 MG/ML IJ SOLN: 8.0000 mg | Freq: Once | INTRAMUSCULAR | Status: DC

## 2022-08-21 MED ORDER — HYDROCODONE-ACETAMINOPHEN 7.5-325 MG PO TABS
1.0000 | ORAL_TABLET | ORAL | Status: DC | PRN
  Administered 2022-08-22: 2 via ORAL
  Filled 2022-08-21 (×2): qty 2

## 2022-08-21 MED ORDER — PANTOPRAZOLE SODIUM 40 MG PO TBEC
40.0000 mg | DELAYED_RELEASE_TABLET | Freq: Every day | ORAL | Status: DC
  Administered 2022-08-21 – 2022-08-22 (×2): 40 mg via ORAL
  Filled 2022-08-21 (×2): qty 1

## 2022-08-21 MED ORDER — ATORVASTATIN CALCIUM 20 MG PO TABS
20.0000 mg | ORAL_TABLET | Freq: Every day | ORAL | Status: DC
  Administered 2022-08-22: 20 mg via ORAL
  Filled 2022-08-21: qty 1

## 2022-08-21 MED ORDER — ACETAMINOPHEN 325 MG PO TABS: 325.0000 mg | ORAL_TABLET | Freq: Four times a day (QID) | ORAL | Status: DC | PRN

## 2022-08-21 MED ORDER — DEXAMETHASONE SODIUM PHOSPHATE 4 MG/ML IJ SOLN
INTRAMUSCULAR | Status: DC | PRN
  Administered 2022-08-21: 8 mg via INTRAVENOUS

## 2022-08-21 MED ORDER — SODIUM CHLORIDE 0.9 % IV SOLN: INTRAVENOUS | Status: DC

## 2022-08-21 MED ORDER — ALBUTEROL SULFATE (2.5 MG/3ML) 0.083% IN NEBU: 3.0000 mL | INHALATION_SOLUTION | Freq: Four times a day (QID) | RESPIRATORY_TRACT | Status: DC | PRN

## 2022-08-21 MED ORDER — PHENYLEPHRINE 80 MCG/ML (10ML) SYRINGE FOR IV PUSH (FOR BLOOD PRESSURE SUPPORT)
PREFILLED_SYRINGE | INTRAVENOUS | Status: AC
  Filled 2022-08-21: qty 10

## 2022-08-21 MED ORDER — POVIDONE-IODINE 10 % EX SWAB
2.0000 | Freq: Once | CUTANEOUS | Status: AC
  Administered 2022-08-21: 2 via TOPICAL

## 2022-08-21 MED ORDER — METHOCARBAMOL 1000 MG/10ML IJ SOLN: 500.0000 mg | Freq: Four times a day (QID) | INTRAVENOUS | Status: DC | PRN

## 2022-08-21 MED ORDER — HYDROCODONE-ACETAMINOPHEN 5-325 MG PO TABS
1.0000 | ORAL_TABLET | ORAL | Status: DC | PRN
  Administered 2022-08-21: 2 via ORAL
  Administered 2022-08-22: 1 via ORAL
  Filled 2022-08-21: qty 1
  Filled 2022-08-21 (×2): qty 2

## 2022-08-21 MED ORDER — PHENYLEPHRINE HCL-NACL 20-0.9 MG/250ML-% IV SOLN
INTRAVENOUS | Status: AC
  Filled 2022-08-21: qty 250

## 2022-08-21 MED ORDER — TRANEXAMIC ACID-NACL 1000-0.7 MG/100ML-% IV SOLN
1000.0000 mg | Freq: Once | INTRAVENOUS | Status: AC
  Administered 2022-08-21: 1000 mg via INTRAVENOUS
  Filled 2022-08-21: qty 100

## 2022-08-21 MED ORDER — ONDANSETRON HCL 4 MG/2ML IJ SOLN: 4.0000 mg | Freq: Four times a day (QID) | INTRAMUSCULAR | Status: DC | PRN

## 2022-08-21 MED ORDER — ASPIRIN 81 MG PO CHEW
81.0000 mg | CHEWABLE_TABLET | Freq: Two times a day (BID) | ORAL | Status: DC
  Administered 2022-08-21 – 2022-08-22 (×2): 81 mg via ORAL
  Filled 2022-08-21 (×2): qty 1

## 2022-08-21 MED ORDER — ZOLPIDEM TARTRATE 10 MG PO TABS
10.0000 mg | ORAL_TABLET | Freq: Every day | ORAL | Status: DC
  Administered 2022-08-21: 10 mg via ORAL
  Filled 2022-08-21: qty 1

## 2022-08-21 MED ORDER — FENTANYL CITRATE (PF) 100 MCG/2ML IJ SOLN
INTRAMUSCULAR | Status: DC | PRN
  Administered 2022-08-21 (×2): 50 ug via INTRAVENOUS

## 2022-08-21 MED ORDER — VANCOMYCIN HCL 1500 MG/300ML IV SOLN
1500.0000 mg | INTRAVENOUS | Status: AC
  Administered 2022-08-21: 1500 mg via INTRAVENOUS
  Filled 2022-08-21: qty 300

## 2022-08-21 MED ORDER — ORAL CARE MOUTH RINSE: 15.0000 mL | Freq: Once | OROMUCOSAL | Status: AC

## 2022-08-21 MED ORDER — METHOCARBAMOL 500 MG PO TABS: 500.0000 mg | ORAL_TABLET | Freq: Four times a day (QID) | ORAL | Status: DC | PRN

## 2022-08-21 MED ORDER — ONDANSETRON HCL 4 MG/2ML IJ SOLN
INTRAMUSCULAR | Status: DC | PRN
  Administered 2022-08-21: 4 mg via INTRAVENOUS

## 2022-08-21 MED ORDER — AMISULPRIDE (ANTIEMETIC) 5 MG/2ML IV SOLN: 10.0000 mg | Freq: Once | INTRAVENOUS | Status: DC | PRN

## 2022-08-21 MED ORDER — DEXAMETHASONE SODIUM PHOSPHATE 10 MG/ML IJ SOLN
INTRAMUSCULAR | Status: AC
  Filled 2022-08-21: qty 1

## 2022-08-21 MED ORDER — ACETAMINOPHEN 10 MG/ML IV SOLN
1000.0000 mg | Freq: Once | INTRAVENOUS | Status: DC | PRN
  Administered 2022-08-21: 1000 mg via INTRAVENOUS

## 2022-08-21 MED ORDER — TRANEXAMIC ACID-NACL 1000-0.7 MG/100ML-% IV SOLN
1000.0000 mg | INTRAVENOUS | Status: AC
  Administered 2022-08-21: 1000 mg via INTRAVENOUS
  Filled 2022-08-21: qty 100

## 2022-08-21 MED ORDER — FENTANYL CITRATE (PF) 100 MCG/2ML IJ SOLN
INTRAMUSCULAR | Status: AC
  Filled 2022-08-21: qty 2

## 2022-08-21 MED ORDER — EPINEPHRINE PF 1 MG/ML IJ SOLN
INTRAMUSCULAR | Status: AC
  Filled 2022-08-21: qty 1

## 2022-08-21 MED ORDER — METOCLOPRAMIDE HCL 5 MG/ML IJ SOLN: 5.0000 mg | Freq: Three times a day (TID) | INTRAMUSCULAR | Status: DC | PRN

## 2022-08-21 MED ORDER — AMLODIPINE BESYLATE 5 MG PO TABS
5.0000 mg | ORAL_TABLET | Freq: Every day | ORAL | Status: DC
  Administered 2022-08-22: 5 mg via ORAL
  Filled 2022-08-21: qty 1

## 2022-08-21 MED ORDER — PHENYLEPHRINE HCL-NACL 20-0.9 MG/250ML-% IV SOLN
INTRAVENOUS | Status: DC | PRN
  Administered 2022-08-21: 30 ug/min via INTRAVENOUS

## 2022-08-21 MED ORDER — SODIUM CHLORIDE (PF) 0.9 % IJ SOLN
INTRAMUSCULAR | Status: AC
  Filled 2022-08-21: qty 30

## 2022-08-21 MED ORDER — GENTAMICIN SULFATE 40 MG/ML IJ SOLN
5.0000 mg/kg | Freq: Once | INTRAVENOUS | Status: AC
  Administered 2022-08-21: 457.2 mg via INTRAVENOUS
  Filled 2022-08-21: qty 11.5

## 2022-08-21 MED ORDER — FENTANYL CITRATE PF 50 MCG/ML IJ SOSY: 25.0000 ug | PREFILLED_SYRINGE | INTRAMUSCULAR | Status: DC | PRN

## 2022-08-21 MED ORDER — IPRATROPIUM-ALBUTEROL 0.5-2.5 (3) MG/3ML IN SOLN: 3.0000 mL | Freq: Four times a day (QID) | RESPIRATORY_TRACT | Status: DC | PRN

## 2022-08-21 MED ORDER — SODIUM CHLORIDE (PF) 0.9 % IJ SOLN
INTRAMUSCULAR | Status: DC | PRN
  Administered 2022-08-21: 30 mL

## 2022-08-21 MED ORDER — PHENOL 1.4 % MT LIQD: 1.0000 | OROMUCOSAL | Status: DC | PRN

## 2022-08-21 MED ORDER — POLYETHYLENE GLYCOL 3350 17 G PO PACK
17.0000 g | PACK | Freq: Two times a day (BID) | ORAL | Status: DC
  Administered 2022-08-21 – 2022-08-22 (×2): 17 g via ORAL
  Filled 2022-08-21 (×2): qty 1

## 2022-08-21 MED ORDER — DEXAMETHASONE SODIUM PHOSPHATE 10 MG/ML IJ SOLN
10.0000 mg | Freq: Once | INTRAMUSCULAR | Status: AC
  Administered 2022-08-22: 10 mg via INTRAVENOUS
  Filled 2022-08-21: qty 1

## 2022-08-21 MED ORDER — BISACODYL 10 MG RE SUPP: 10.0000 mg | Freq: Every day | RECTAL | Status: DC | PRN

## 2022-08-21 MED ORDER — METOCLOPRAMIDE HCL 5 MG PO TABS: 5.0000 mg | ORAL_TABLET | Freq: Three times a day (TID) | ORAL | Status: DC | PRN

## 2022-08-21 MED ORDER — EPHEDRINE SULFATE (PRESSORS) 50 MG/ML IJ SOLN
INTRAMUSCULAR | Status: DC | PRN
  Administered 2022-08-21: 10 mg via INTRAVENOUS

## 2022-08-21 MED ORDER — EPHEDRINE 5 MG/ML INJ
INTRAVENOUS | Status: AC
  Filled 2022-08-21: qty 5

## 2022-08-21 MED ORDER — 0.9 % SODIUM CHLORIDE (POUR BTL) OPTIME
TOPICAL | Status: DC | PRN
  Administered 2022-08-21: 1000 mL

## 2022-08-21 MED ORDER — VANCOMYCIN HCL IN DEXTROSE 1-5 GM/200ML-% IV SOLN
1000.0000 mg | Freq: Two times a day (BID) | INTRAVENOUS | Status: AC
  Administered 2022-08-21: 1000 mg via INTRAVENOUS
  Filled 2022-08-21: qty 200

## 2022-08-21 MED ORDER — BUPIVACAINE IN DEXTROSE 0.75-8.25 % IT SOLN
INTRATHECAL | Status: DC | PRN
  Administered 2022-08-21: 2 mL via INTRATHECAL

## 2022-08-21 MED ORDER — ACETAMINOPHEN 10 MG/ML IV SOLN
INTRAVENOUS | Status: AC
  Filled 2022-08-21: qty 100

## 2022-08-21 MED ORDER — FLUTICASONE PROPIONATE 50 MCG/ACT NA SUSP: 1.0000 | Freq: Every day | NASAL | Status: DC | PRN

## 2022-08-21 MED ORDER — DIPHENHYDRAMINE HCL 12.5 MG/5ML PO ELIX: 12.5000 mg | ORAL_SOLUTION | ORAL | Status: DC | PRN

## 2022-08-21 MED ORDER — KETOROLAC TROMETHAMINE 30 MG/ML IJ SOLN
INTRAMUSCULAR | Status: DC | PRN
  Administered 2022-08-21: 30 mg

## 2022-08-21 SURGICAL SUPPLY — 38 items
BAG COUNTER SPONGE SURGICOUNT (BAG) IMPLANT
BAG DECANTER FOR FLEXI CONT (MISCELLANEOUS) IMPLANT
BAG ZIPLOCK 12X15 (MISCELLANEOUS) IMPLANT
BLADE SAG 18X100X1.27 (BLADE) ×1 IMPLANT
COVER PERINEAL POST (MISCELLANEOUS) ×1 IMPLANT
COVER SURGICAL LIGHT HANDLE (MISCELLANEOUS) ×1 IMPLANT
CUP ACET PINNACLE SECTR 58MM (Hips) IMPLANT
DERMABOND ADVANCED .7 DNX12 (GAUZE/BANDAGES/DRESSINGS) ×1 IMPLANT
DRAPE FOOT SWITCH (DRAPES) ×1 IMPLANT
DRAPE STERI IOBAN 125X83 (DRAPES) ×1 IMPLANT
DRAPE U-SHAPE 47X51 STRL (DRAPES) ×2 IMPLANT
DRESSING AQUACEL AG SP 3.5X10 (GAUZE/BANDAGES/DRESSINGS) ×1 IMPLANT
DRSG AQUACEL AG SP 3.5X10 (GAUZE/BANDAGES/DRESSINGS) ×1
DURAPREP 26ML APPLICATOR (WOUND CARE) ×1 IMPLANT
ELECT REM PT RETURN 15FT ADLT (MISCELLANEOUS) ×1 IMPLANT
GLOVE BIO SURGEON STRL SZ 6 (GLOVE) ×1 IMPLANT
GLOVE BIOGEL PI IND STRL 6.5 (GLOVE) ×1 IMPLANT
GLOVE BIOGEL PI IND STRL 7.5 (GLOVE) ×1 IMPLANT
GLOVE ORTHO TXT STRL SZ7.5 (GLOVE) ×2 IMPLANT
GOWN STRL REUS W/ TWL LRG LVL3 (GOWN DISPOSABLE) ×2 IMPLANT
GOWN STRL REUS W/TWL LRG LVL3 (GOWN DISPOSABLE) ×2
HEAD CERAMIC DELTA 36 PLUS 1.5 (Hips) IMPLANT
HOLDER FOLEY CATH W/STRAP (MISCELLANEOUS) ×1 IMPLANT
KIT TURNOVER KIT A (KITS) IMPLANT
LINER NEUTRAL 36X58 PLUS4 IMPLANT
PACK ANTERIOR HIP CUSTOM (KITS) ×1 IMPLANT
PINNACLE SECTOR CUP 58MM (Hips) ×1 IMPLANT
SCREW 6.5MMX25MM (Screw) IMPLANT
STEM FEM ACTIS HIGH SZ8 (Stem) IMPLANT
SUT MNCRL AB 4-0 PS2 18 (SUTURE) ×1 IMPLANT
SUT STRATAFIX 0 PDS 27 VIOLET (SUTURE) ×1
SUT VIC AB 1 CT1 36 (SUTURE) ×3 IMPLANT
SUT VIC AB 2-0 CT1 27 (SUTURE) ×2
SUT VIC AB 2-0 CT1 TAPERPNT 27 (SUTURE) ×2 IMPLANT
SUTURE STRATFX 0 PDS 27 VIOLET (SUTURE) ×1 IMPLANT
TRAY FOLEY MTR SLVR 16FR STAT (SET/KITS/TRAYS/PACK) IMPLANT
TUBE SUCTION HIGH CAP CLEAR NV (SUCTIONS) ×1 IMPLANT
WATER STERILE IRR 1000ML POUR (IV SOLUTION) ×1 IMPLANT

## 2022-08-21 NOTE — Interval H&P Note (Signed)
History and Physical Interval Note:  08/21/2022 12:40 PM  Travis Blanchard  has presented today for surgery, with the diagnosis of Right hip osteoarthritis.  The various methods of treatment have been discussed with the patient and family. After consideration of risks, benefits and other options for treatment, the patient has consented to  Procedure(s): TOTAL HIP ARTHROPLASTY ANTERIOR APPROACH (Right) as a surgical intervention.  The patient's history has been reviewed, patient examined, no change in status, stable for surgery.  I have reviewed the patient's chart and labs.  Questions were answered to the patient's satisfaction.     [name]

## 2022-08-21 NOTE — H&P (Signed)
TOTAL HIP ADMISSION H&P  Patient is admitted for right total hip arthroplasty.  Subjective:  Chief Complaint: right hip pain  HPI: Travis Blanchard, 77 y.o. male, has a history of pain and functional disability in the right hip(s) due to arthritis and patient has failed non-surgical conservative treatments for greater than 12 weeks to include NSAID's and/or analgesics and activity modification.  Onset of symptoms was gradual starting 2 years ago with gradually worsening course since that time.The patient noted no past surgery on the right hip(s).  Patient currently rates pain in the right hip at 8 out of 10 with activity. Patient has worsening of pain with activity and weight bearing, pain that interfers with activities of daily living, and pain with passive range of motion. Patient has evidence of joint space narrowing by imaging studies. This condition presents safety issues increasing the risk of falls. There is no current active infection.  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   SOB (shortness of breath) 10/04/2021   Palpitations 10/04/2021   Colon cancer screening 11/22/2020   Abnormal CT of the abdomen 11/22/2020   Other specified abdominal hernia without obstruction or gangrene [id number]   History of Helicobacter pylori infection 09/07/2020   Other hyperlipidemia 07/23/2018   Diabetes mellitus type II, controlled ([place]) 04/10/2018   Essential hypertension 04/10/2018   History of vitamin D deficiency 04/10/2018   Obesity 04/10/2018   History of colonic polyps 05/29/2017   Incisional hernia without mention of obstruction or gangrene 03/11/2013   Past Medical History:  Diagnosis Date   Arthritis    Asthma    B12 deficiency    Cancer ([place])    Prostate   Complication of anesthesia    had spinal without patient being sedated at all. done at [facility]   COPD (chronic obstructive pulmonary disease) ([place])    Diabetes  mellitus without complication ([place])    Hyperlipidemia    Hypertension    Umbilical hernia    Vitamin D deficiency     Past Surgical History:  Procedure Laterality Date   COLONOSCOPY  2008   Dr. Gala Romney: 2 diminutive polyps in rectum (hyperplastic), otherwise normal   COLONOSCOPY  2000   Per notes in epic, history of adenomas at outside facility   COLONOSCOPY WITH PROPOFOL N/A 07/18/2017   Procedure: COLONOSCOPY WITH PROPOFOL;  Surgeon: Daneil Dolin, MD;  Location: [facility];  Service: Endoscopy;  Laterality: N/A;  8:45   HEMORRHOID SURGERY     age 70   KNEE ARTHROSCOPY Right 1985   KNEE ARTHROSCOPY Right 1990   LAPAROSCOPIC GASTRIC SLEEVE RESECTION N/A 03/06/2022   Procedure: LAPAROSCOPIC SLEEVE GASTRECTOMY;  Surgeon: Greer Pickerel, MD;  Location: [facility];  Service: General;  Laterality: N/A;   PROSTATE SURGERY  2000   Prostate removal/Cancer   UPPER GI ENDOSCOPY N/A 03/06/2022   Procedure: UPPER GI ENDOSCOPY;  Surgeon: Greer Pickerel, MD;  Location: [facility];  Service: General;  Laterality: N/A;    No current facility-administered medications for this encounter.   Current Outpatient Medications  Medication Sig Dispense Refill Last Dose   acetaminophen (TYLENOL) 325 MG tablet Take 650 mg by mouth every 6 (six) hours as needed for moderate pain.      albuterol (VENTOLIN HFA) 108 (90 Base) MCG/ACT inhaler Inhale 1 puff into the lungs every 6 (six) hours as needed for wheezing or shortness of breath. 1 Inhaler 0    amLODipine (NORVASC)  5 MG tablet Take 5 mg by mouth daily.      atorvastatin (LIPITOR) 20 MG tablet Take 20 mg by mouth daily.       CALCIUM-VITAMIN D PO Take 1 tablet by mouth in the morning and at bedtime.      Cholecalciferol (VITAMIN D3) 1000 units CAPS Take 1,000 Units by mouth daily.      fluticasone (FLONASE) 50 MCG/ACT nasal spray Place 1 spray into both nostrils daily as needed for allergies.      ipratropium-albuterol (DUONEB) 0.5-2.5 (3) MG/3ML SOLN Take 3 mLs by  nebulization every 6 (six) hours as needed (shortness of breath).      loratadine (CLARITIN) 10 MG tablet Take 10 mg by mouth daily.      Multiple Vitamins-Minerals (BARIATRIC MULTIVITAMINS/IRON PO) Take 3 capsules by mouth daily.      omeprazole (PRILOSEC) 20 MG capsule Take 20 mg by mouth daily as needed (acid reflux).      psyllium (METAMUCIL) 58.6 % powder Take 0.5 packets by mouth at bedtime.      TURMERIC PO Take 1 capsule by mouth daily.      vitamin B-12 (CYANOCOBALAMIN) 1000 MCG tablet Take 1,000 mcg by mouth daily.      zolpidem (AMBIEN) 10 MG tablet Take 10 mg by mouth at bedtime.      Allergies  Allergen Reactions   Lisinopril Swelling    Facial swelling   Penicillins Anaphylaxis, Hives, Shortness Of Breath, Itching and Other (See Comments)    Has patient had a PCN reaction causing immediate rash, facial/tongue/throat swelling, SOB or lightheadedness with hypotension: Yes Has patient had a PCN reaction causing severe rash involving mucus membranes or skin necrosis: No Has patient had a PCN reaction that required hospitalization: No Has patient had a PCN reaction occurring within the last 10 years: No If all of the above answers are "NO", then may proceed with Cephalosporin use.     Social History   Tobacco Use   Smoking status: Former    Packs/day: 1.00    Years: 25.00    Total pack years: 25.00    Types: Cigarettes    Quit date: 03/11/2002    Years since quitting: 20.4   Smokeless tobacco: Never  Substance Use Topics   Alcohol use: Yes    Comment: 6 pack of beer per month    Family History  Problem Relation Age of Onset   Diabetes Mother    Hypertension Mother    Hyperlipidemia Mother    Stroke Mother    Heart disease Mother    Depression Mother    Obesity Mother    Eating disorder Mother    Cancer Father    Depression Father    Anxiety disorder Father    Liver disease Father    Alcoholism Father    Pancreatic cancer Father    Cancer Sister         Breast   Diabetes Sister    Colon cancer Neg Hx    Esophageal cancer Neg Hx    Inflammatory bowel disease Neg Hx    Rectal cancer Neg Hx    Stomach cancer Neg Hx      Review of Systems  Constitutional:  Negative for chills and fever.  Respiratory:  Negative for cough and shortness of breath.   Cardiovascular:  Negative for chest pain.  Gastrointestinal:  Negative for nausea and vomiting.  Musculoskeletal:  Positive for arthralgias.     Objective:  Physical Exam Well  nourished and well developed. General: Alert and oriented x3, cooperative and pleasant, no acute distress. Head: normocephalic, atraumatic, neck supple. Eyes: EOMI.  Musculoskeletal: Right hip exam: Painful and limited hip flexion internal rotation to 5 degrees with external rotation of 20 degrees Slight external rotation contracture with active hip flexion Mild tenderness around the right hip girdle Weakness related to his pain with 5 -/5 strength  Right knee exam: No palpable effusion, warmth erythema Bilateral genu varum with slight flexion contractures Tenderness over the medial aspect of the joints Stable medial and lateral collateral ligaments   Calves soft and nontender. Motor function intact in LE. Strength 5/5 LE bilaterally. Neuro: Distal pulses 2+. Sensation to light touch intact in LE.  Vital signs in last 24 hours:    Labs:   Estimated body mass index is 37.94 kg/m as calculated from the following:   Height as of 08/17/22: 5' 10"$  (1.778 m).   Weight as of 08/17/22: 119.9 kg.   Imaging Review Plain radiographs demonstrate severe degenerative joint disease of the right hip(s). The bone quality appears to be adequate for age and reported activity level.      Assessment/Plan:  End stage arthritis, right hip(s)  The patient history, physical examination, clinical judgement of the provider and imaging studies are consistent with end stage degenerative joint disease of the right hip(s)  and total hip arthroplasty is deemed medically necessary. The treatment options including medical management, injection therapy, arthroscopy and arthroplasty were discussed at length. The risks and benefits of total hip arthroplasty were presented and reviewed. The risks due to aseptic loosening, infection, stiffness, dislocation/subluxation,  thromboembolic complications and other imponderables were discussed.  The patient acknowledged the explanation, agreed to proceed with the plan and consent was signed. Patient is being admitted for inpatient treatment for surgery, pain control, PT, OT, prophylactic antibiotics, VTE prophylaxis, progressive ambulation and ADL's and discharge planning.The patient is planning to be discharged  home.  Therapy Plans: HEP Disposition: Home with wife Planned DVT Prophylaxis: aspirin 80m BID DME needed: walker PCP: Dr. FLegrand Rams(ida [facility]ix - NP - [phone] Cardio: Dr. HPercival Spanish- clearance received  TXA: IV Allergies: PCN - Anesthesia Concerns: with prostate sx --- bad experience with spinal - wasn't told about this BMI: 38.8 Last HgbA1c: ? <6   Other: - hydrocodone, tylenol, robaxin - No hx of VTE, hx of prostate CA (10-15+ years ago) - hx of gastric sleeve   ACostella Hatcher PA-C Orthopedic Surgery [facility] [place] [phone]

## 2022-08-21 NOTE — Anesthesia Procedure Notes (Addendum)
Spinal  Patient location during procedure: OR Start time: 08/21/2022 1:50 PM End time: 08/21/2022 1:55 PM Reason for block: surgical anesthesia Staffing Performed: anesthesiologist  Anesthesiologist: Murvin Natal, MD Performed by: Murvin Natal, MD Authorized by: Murvin Natal, MD   Preanesthetic Checklist Completed: patient identified, IV checked, risks and benefits discussed, surgical consent, monitors and equipment checked, pre-op evaluation and timeout performed Spinal Block Patient position: sitting Prep: DuraPrep Patient monitoring: cardiac monitor, continuous pulse ox and blood pressure Approach: midline Location: L4-5 Injection technique: single-shot Needle Needle type: Pencan  Needle gauge: 24 G Needle length: 9 cm Assessment Sensory level: T10 Events: CSF return and second provider Additional Notes Functioning IV was confirmed and monitors were applied. Sterile prep and drape, including hand hygiene and sterile gloves were used. The patient was positioned and the spine was prepped. The skin was anesthetized with lidocaine.  Previous unsuccessful attempts by CRNA. Free flow of clear CSF was obtained on the second attempt prior to injecting local anesthetic into the CSF.  The spinal needle aspirated freely following injection.  The needle was carefully withdrawn.  The patient tolerated the procedure well.

## 2022-08-21 NOTE — Care Plan (Signed)
Ortho Bundle Case Management Note  Patient Details  Name: Travis Blanchard MRN: [id number] Date of Birth: 05-12-1946                  R THA on 08/21/22.  DCP: Home with wife.  DME: No needs. Has RW.  PT: HEP   DME Arranged:  N/A DME Agency:       Additional Comments: Please contact me with any questions of if this plan should need to change.   Dario Ave, Case Manager  [facility]  [phone] 08/21/2022, 4:26 PM

## 2022-08-21 NOTE — Discharge Instructions (Signed)

## 2022-08-21 NOTE — Anesthesia Postprocedure Evaluation (Signed)
Anesthesia Post Note  Patient: CHRISTOFF SHIMKO  Procedure(s) Performed: TOTAL HIP ARTHROPLASTY ANTERIOR APPROACH (Right: Hip)     Patient location during evaluation: PACU Anesthesia Type: Spinal Level of consciousness: awake Pain management: pain level controlled Vital Signs Assessment: post-procedure vital signs reviewed and stable Respiratory status: spontaneous breathing, nonlabored ventilation and respiratory function stable Cardiovascular status: blood pressure returned to baseline and stable Postop Assessment: no apparent nausea or vomiting Anesthetic complications: no   No notable events documented.  Last Vitals:  Vitals:   08/21/22 1730 08/21/22 1812  BP: (!) 142/78 139/72  Pulse: (!) 52 80  Resp: 10 16  Temp:  36.6 C  SpO2: 99% 100%    Last Pain:  Vitals:   08/21/22 1812  TempSrc: Oral  PainSc: 6                  Ringo Sherod P Stephenie Navejas

## 2022-08-21 NOTE — Op Note (Signed)
NAME:  Travis Blanchard                ACCOUNT NO.: [id number]      MEDICAL RECORD NO.: [id number]      FACILITY:  [facility]      PHYSICIAN:  [name]  DATE OF BIRTH:  02-14-1946     DATE OF PROCEDURE:  08/21/2022                                 OPERATIVE REPORT         PREOPERATIVE DIAGNOSIS: Right  hip osteoarthritis.      POSTOPERATIVE DIAGNOSIS:  Right hip osteoarthritis.      PROCEDURE:  Right total hip replacement through an anterior approach   utilizing DePuy THR system, component size 58 mm pinnacle cup, a size 36+4 neutral   Altrex liner, a size 8 Hi Actis stem with a 36+1.5 delta ceramic   ball.      SURGEON:  Pietro Cassis. Alvan Dame, M.D.      ASSISTANT:  Costella Hatcher, PA-C     ANESTHESIA:  Spinal.      SPECIMENS:  None.      COMPLICATIONS:  None.      BLOOD LOSS:  1100 cc     DRAINS:  None.      INDICATION OF THE PROCEDURE:  Travis Blanchard is a 77 y.o. male who had   presented to office for evaluation of right hip pain.  Radiographs revealed   progressive degenerative changes with bone-on-bone   articulation of the  hip joint, including subchondral cystic changes and osteophytes.  The patient had painful limited range of   motion significantly affecting their overall quality of life and function.  The patient was failing to    respond to conservative measures including medications and/or injections and activity modification and at this point was ready   to proceed with more definitive measures.  Consent was obtained for   benefit of pain relief.  Specific risks of infection, DVT, component   failure, dislocation, neurovascular injury, and need for revision surgery were reviewed in the office.     PROCEDURE IN DETAIL:  The patient was brought to operative theater.   Once adequate anesthesia, preoperative antibiotics, 2 gm of Ancef, 1 gm of Tranexamic Acid, and 10 mg of Decadron were administered, the patient was positioned supine on the [employer] table.  Once the patient was safely positioned with adequate padding of boney prominences we predraped out the hip, and used fluoroscopy to confirm orientation of the pelvis.      The right hip was then prepped and draped from proximal iliac crest to   mid thigh with a shower curtain technique.      Time-out was performed identifying the patient, planned procedure, and the appropriate extremity.     An incision was then made 2 cm lateral to the   anterior superior iliac spine extending over the orientation of the   tensor fascia lata muscle and sharp dissection was carried down to the   fascia of the muscle.      The fascia was then incised.  The muscle belly was identified and swept   laterally and retractor placed along the superior neck.  Following   cauterization of the circumflex vessels and removing some pericapsular   fat, a second cobra retractor was placed on the inferior  neck.  A T-capsulotomy was made along the line of the   superior neck to the trochanteric fossa, then extended proximally and   distally.  Tag sutures were placed and the retractors were then placed   intracapsular.  We then identified the trochanteric fossa and   orientation of my neck cut and then made a neck osteotomy with the femur on traction.  The femoral   head was removed without difficulty or complication.  Traction was let   off and retractors were placed posterior and anterior around the   acetabulum.      The labrum and foveal tissue were debrided.  I began reaming with a 50 mm   reamer and reamed up to 57 mm reamer with good bony bed preparation and a 58 mm  cup was chosen.  The final 58 mm Pinnacle cup was then impacted under fluoroscopy to confirm the depth of penetration and orientation with respect to   Abduction and forward flexion.  A screw was placed into the ilium followed by the hole eliminator.  The final   36+4 neutral Altrex liner was impacted with good visualized rim fit.  The cup  was positioned anatomically within the acetabular portion of the pelvis.      At this point, the femur was rolled to 100 degrees.  Further capsule was   released off the inferior aspect of the femoral neck.  I then   released the superior capsule proximally.  With the leg in a neutral position the hook was placed laterally   along the femur under the vastus lateralis origin and elevated manually and then held in position using the hook attachment on the bed.  The leg was then extended and adducted with the leg rolled to 100   degrees of external rotation.  Retractors were placed along the medial calcar and posteriorly over the greater trochanter.  Once the proximal femur was fully   exposed, I used a box osteotome to set orientation.  I then began   broaching with the starting chili pepper broach and passed this by hand and then broached up to 8.  With the 8 broach in place I chose a high offset neck and did several trial reductions.  The offset was appropriate, leg lengths   appeared to be equal best matched with the +1.5 head ball trial confirmed radiographically.   Given these findings, I went ahead and dislocated the hip, repositioned all   retractors and positioned the right hip in the extended and abducted position.  The final 8 Hi Actis stem was   chosen and it was impacted down to the level of neck cut.  Based on this   and the trial reductions, a final 36+1.5 delta ceramic ball was chosen and   impacted onto a clean and dry trunnion, and the hip was reduced.  The   hip had been irrigated throughout the case again at this point.  I did   reapproximate the superior capsular leaflet to the anterior leaflet   using #1 Vicryl.  The fascia of the   tensor fascia lata muscle was then reapproximated using #1 Vicryl and #0 Stratafix sutures.  The   remaining wound was closed with 2-0 Vicryl and running 4-0 Monocryl.   The hip was cleaned, dried, and dressed sterilely using Dermabond and    Aquacel dressing.  The patient was then brought   to recovery room in stable condition tolerating the procedure well.    Caryl Pina  Lu Duffel, PA-C was present for the entirety of the case involved from   preoperative positioning, perioperative retractor management, general   facilitation of the case, as well as primary wound closure as assistant.            Pietro Cassis Alvan Dame, M.D.        08/21/2022 1:50 PM

## 2022-08-21 NOTE — Transfer of Care (Signed)
Immediate Anesthesia Transfer of Care Note  Patient: Travis Blanchard  Procedure(s) Performed: TOTAL HIP ARTHROPLASTY ANTERIOR APPROACH (Right: Hip)  Patient Location: PACU  Anesthesia Type:Spinal  Level of Consciousness: awake, alert , and patient cooperative  Airway & Oxygen Therapy: Patient Spontanous Breathing and Patient connected to face mask oxygen  Post-op Assessment: Report given to RN and Post -op Vital signs reviewed and stable  Post vital signs: Reviewed and stable  Last Vitals:  Vitals Value Taken Time  BP    Temp    Pulse    Resp 15 08/21/22 1534  SpO2    Vitals shown include unvalidated device data.  Last Pain:  Vitals:   08/21/22 1225  TempSrc:   PainSc: 7          Complications: No notable events documented.

## 2022-08-22 DIAGNOSIS — E119 Type 2 diabetes mellitus without complications: Secondary | ICD-10-CM | POA: Diagnosis not present

## 2022-08-22 DIAGNOSIS — I10 Essential (primary) hypertension: Secondary | ICD-10-CM | POA: Diagnosis not present

## 2022-08-22 DIAGNOSIS — M1611 Unilateral primary osteoarthritis, right hip: Secondary | ICD-10-CM | POA: Diagnosis not present

## 2022-08-22 DIAGNOSIS — J449 Chronic obstructive pulmonary disease, unspecified: Secondary | ICD-10-CM | POA: Diagnosis not present

## 2022-08-22 DIAGNOSIS — J45909 Unspecified asthma, uncomplicated: Secondary | ICD-10-CM | POA: Diagnosis not present

## 2022-08-22 DIAGNOSIS — Z8546 Personal history of malignant neoplasm of prostate: Secondary | ICD-10-CM | POA: Diagnosis not present

## 2022-08-22 LAB — BASIC METABOLIC PANEL
Anion gap: 8 (ref 5–15)
BUN: 11 mg/dL (ref 8–23)
CO2: 24 mmol/L (ref 22–32)
Calcium: 8.4 mg/dL — ABNORMAL LOW (ref 8.9–10.3)
Chloride: 105 mmol/L (ref 98–111)
Creatinine, Ser: 0.84 mg/dL (ref 0.61–1.24)
GFR, Estimated: >60 mL/min (ref >60)
Glucose, Bld: 165 mg/dL — ABNORMAL HIGH (ref 70–99)
Potassium: 3.9 mmol/L (ref 3.5–5.1)
Sodium: 137 mmol/L (ref 135–145)

## 2022-08-22 LAB — CBC
HCT: 37.7 % — ABNORMAL LOW (ref 39.0–52.0)
Hemoglobin: 12.3 g/dL — ABNORMAL LOW (ref 13.0–17.0)
MCH: 29.3 pg (ref 26.0–34.0)
MCHC: 32.6 g/dL (ref 30.0–36.0)
MCV: 89.8 fL (ref 80.0–100.0)
Platelets: 169 10*3/uL (ref 150–400)
RBC: 4.2 MIL/uL — ABNORMAL LOW (ref 4.22–5.81)
RDW: 12.3 % (ref 11.5–15.5)
WBC: 11.9 10*3/uL — ABNORMAL HIGH (ref 4.0–10.5)
nRBC: 0 % (ref 0.0–0.2)

## 2022-08-22 MED ORDER — POLYETHYLENE GLYCOL 3350 17 G PO PACK: 17.0000 g | PACK | Freq: Two times a day (BID) | ORAL | 0 refills | Status: DC

## 2022-08-22 MED ORDER — METHOCARBAMOL 500 MG PO TABS: 500.0000 mg | ORAL_TABLET | Freq: Four times a day (QID) | ORAL | 2 refills | Status: DC | PRN

## 2022-08-22 MED ORDER — ASPIRIN 81 MG PO CHEW: 81.0000 mg | CHEWABLE_TABLET | Freq: Two times a day (BID) | ORAL | 0 refills | Status: AC

## 2022-08-22 MED ORDER — HYDROCODONE-ACETAMINOPHEN 5-325 MG PO TABS: 1.0000 | ORAL_TABLET | ORAL | 0 refills | Status: DC | PRN

## 2022-08-22 NOTE — TOC Transition Note (Signed)
Transition of Care [facility]) - CM/SW Discharge Note   Patient Details  Name: Travis Blanchard MRN: [id number] Date of Birth: Oct 28, 1945  [facility]) CM/SW Contact:  Lennart Pall, LCSW Phone Number: 08/22/2022, 10:43 AM   Clinical Narrative:     Met with pt today who reports he does need a RW - no DME agency preference - order placed with [employer] for delivery to room.  Plan for HEP.  No further TOC needs.  Final next level of care: Home/Self Care Barriers to Discharge: No Barriers Identified   Patient Goals and CMS Choice      Discharge Placement                         Discharge Plan and Services Additional resources added to the After Visit Summary for                  DME Arranged: Walker rolling DME Agency: [employer] Date DME Agency Contacted: 08/22/22 Time DME Agency Contacted: 0930              Social Determinants of Health ([place]) Interventions SDOH Screenings   Food Insecurity: No Food Insecurity (08/21/2022)  Housing: Low Risk  (08/21/2022)  Transportation Needs: No Transportation Needs (08/21/2022)  Utilities: Not At Risk (08/21/2022)  Depression (PHQ2-9): Low Risk  (12/22/2020)  Tobacco Use: Medium Risk (08/21/2022)     Readmission Risk Interventions    03/06/2022   12:31 PM  Readmission Risk Prevention Plan  Post Dischage Appt Complete  Medication Screening Complete  Transportation Screening Complete

## 2022-08-22 NOTE — Progress Notes (Signed)
   Subjective: 1 Day Post-Op Procedure(s) (LRB): TOTAL HIP ARTHROPLASTY ANTERIOR APPROACH (Right) Patient reports pain as mild.   Patient seen in rounds for Dr. Alvan Dame. Patient is resting in bed on exam this morning. No acute events overnight. Foley catheter removed.  We will start therapy today.   Objective: Vital signs in last 24 hours: Temp:  [97.9 F (36.6 C)-99.7 F (37.6 C)] 98.1 F (36.7 C) (02/14 0635) Pulse Rate:  [49-96] 75 (02/14 0635) Resp:  [9-20] 17 (02/14 0635) BP: (69-151)/(48-86) 120/71 (02/14 0635) SpO2:  [97 %-100 %] 98 % (02/14 0635) Weight:  [297 kg] 119 kg (02/13 1225)  Intake/Output from previous day:  Intake/Output Summary (Last 24 hours) at 08/22/2022 0825 Last data filed at 08/22/2022 0600 Gross per 24 hour  Intake 3395.76 ml  Output 4950 ml  Net -1554.24 ml     Intake/Output this shift: No intake/output data recorded.  Labs: Recent Labs    08/22/22 0335  HGB 12.3*   Recent Labs    08/22/22 0335  WBC 11.9*  RBC 4.20*  HCT 37.7*  PLT 169   Recent Labs    08/22/22 0335  NA 137  K 3.9  CL 105  CO2 24  BUN 11  CREATININE 0.84  GLUCOSE 165*  CALCIUM 8.4*   No results for input(s): "LABPT", "INR" in the last 72 hours.  Exam: General - Patient is Alert and Oriented Extremity - Neurologically intact Sensation intact distally Intact pulses distally Dorsiflexion/Plantar flexion intact Dressing - dressing C/D/I Motor Function - intact, moving foot and toes well on exam.   Past Medical History:  Diagnosis Date   Arthritis    Asthma    B12 deficiency    Cancer ([place])    Prostate   Complication of anesthesia    had spinal without patient being sedated at all. done at [facility]   COPD (chronic obstructive pulmonary disease) ([place])    Diabetes mellitus without complication ([place])    Hyperlipidemia    Hypertension    Umbilical hernia    Vitamin D deficiency     Assessment/Plan: 1 Day Post-Op Procedure(s) (LRB): TOTAL HIP  ARTHROPLASTY ANTERIOR APPROACH (Right) Principal Problem:   S/P total right hip arthroplasty  Estimated body mass index is 37.64 kg/m as calculated from the following:   Height as of this encounter: '5\' 10"'$  (1.778 m).   Weight as of this encounter: 119 kg. Advance diet Up with therapy D/C IV fluids  DVT Prophylaxis - Aspirin Weight bearing as tolerated.  Hgb stable at 12.3 this AM.  Plan is to go Home after hospital stay. Plan for discharge today after meeting goals with therapy. Follow up in the office in 2 weeks.   Griffith Citron, PA-C Orthopedic Surgery [phone] 08/22/2022, 8:25 AM

## 2022-08-22 NOTE — Evaluation (Signed)
Physical Therapy Evaluation Patient Details Name: Travis Blanchard MRN: [id number] DOB: Nov 09, 1945 Today's Date: 08/22/2022  History of Present Illness  Pt is 77 yo male admitted 08/21/22 for R anterior THA.  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  Clinical Impression   Pt is s/p THA resulting in the deficits listed below (see PT Problem List). Pt is normally fairly independent/modified independent at home.  Reports he has adjustable bed and lift chair that he uses to stand. He also had adaptive equipment to help with dressing.  Pt had recent bariatric surgery and also has abdominal hernia.  Pt lives with wife but she has mobility issues of her own and cannot physically assist, drive, or perform IADLs while pt recovers.  Pt reports he could see if his daughter could stay with him for a while, but is unsure if this is possible.  Today, pt had good pain control and was overall supervision/min guard level for transfers and to ambulate 200' with RW.  He did require min cues for safety.  Pt expected to progress well but due to limited support at home -recommend either continued acute therapy or HHPT for home safety and to assess progress in home (did notify ortho PA).  Pt will benefit from skilled PT to increase their independence and safety with mobility to allow discharge to the venue listed below.          Recommendations for follow up therapy are one component of a multi-disciplinary discharge planning process, led by the attending physician.  Recommendations may be updated based on patient status, additional functional criteria and insurance authorization.  Follow Up Recommendations Follow physician's recommendations for discharge plan and follow up therapies (could potentially benefit from HHPT (notified PA))      Assistance Recommended at Discharge Frequent or constant Supervision/Assistance  Patient can return  home with the following  A little help with walking and/or transfers;A little help with bathing/dressing/bathroom;Assistance with cooking/housework;Help with stairs or ramp for entrance    Equipment Recommendations Other (comment) (RW delivered in room)  Recommendations for Other Services       Functional Status Assessment Patient has had a recent decline in their functional status and demonstrates the ability to make significant improvements in function in a reasonable and predictable amount of time.     Precautions / Restrictions Precautions Precautions: Fall Restrictions Weight Bearing Restrictions: Yes RLE Weight Bearing: Weight bearing as tolerated      Mobility  Bed Mobility Overal bed mobility: Needs Assistance Bed Mobility: Supine to Sit, Sit to Supine     Supine to sit: Supervision, HOB elevated Sit to supine: Supervision, HOB elevated   General bed mobility comments: Pt utilizing gait belt to assist R LE with cues.  Does have adjustable bed at home    Transfers Overall transfer level: Needs assistance Equipment used: Rolling walker (2 wheels) Transfers: Sit to/from Stand Sit to Stand: Min guard, Supervision           General transfer comment: Sit to stand from chair x 2 and from bed.  Started min guard progressed to supervision    Ambulation/Gait Ambulation/Gait assistance: Min guard, Supervision Gait Distance (Feet): 200 Feet Assistive device: Rolling walker (2 wheels) Gait Pattern/deviations: Step-through pattern, Decreased stride length, Trunk flexed, Decreased weight shift to right Gait velocity: decreased     General Gait Details: Forward trunk and initial cues for RW proximity but no overt  LOB  Stairs Stairs:  (no stairs at home)          Wheelchair Mobility    Modified Rankin (Stroke Patients Only)       Balance Overall balance assessment: Needs assistance Sitting-balance support: No upper extremity supported Sitting  balance-Leahy Scale: Good     Standing balance support: No upper extremity supported, Bilateral upper extremity supported Standing balance-Leahy Scale: Fair Standing balance comment: RW to ambulate but could static stand without support                             Pertinent Vitals/Pain Pain Assessment Pain Assessment: 0-10 Pain Score: 3  Pain Location: R hip Pain Descriptors / Indicators: Discomfort Pain Intervention(s): Limited activity within patient's tolerance, Monitored during session, Premedicated before session    [place] expects to be discharged to:: Private residence Living Arrangements: Spouse/significant other (wife not abl e to physically assist, perform IADLs or drive, she has hx of falls) Available Help at Discharge: Family;Available PRN/intermittently (wife is present 24 hr but not able to assist and has mobility issues of her own; daughter typically available intermittently but pt reports he will see if she could help more initially) Type of Home: House Home Access: Ramped entrance     Alternate Level Stairs-Number of Steps: does have stair lift Home Layout: Two level;Able to live on main level with bedroom/bathroom Home Equipment: Adaptive equipment;Shower seat Additional Comments: Has RW delivered in room    Prior Function Prior Level of Function : Independent/Modified Independent             Mobility Comments: Could ambulate in community; did use his wife's rollator to get started in morning but could progress off ADLs Comments: Independent with ADLs except R socks and shoes and used adaptive equipment to do this; could do light IADLs and did grocery shoppint     Hand Dominance        Extremity/Trunk Assessment   Upper Extremity Assessment Upper Extremity Assessment: Overall WFL for tasks assessed    Lower Extremity Assessment Lower Extremity Assessment: LLE deficits/detail;RLE deficits/detail RLE Deficits /  Details: Expected post op changed; ROM : WFL; MMT hip 2/5, knee 3/5 , ankle 5/5    Cervical / Trunk Assessment Cervical / Trunk Assessment: Other exceptions;Kyphotic Cervical / Trunk Exceptions: Pt with abdominal hernia  and forward head/trunk  Communication   Communication: No difficulties  Cognition Arousal/Alertness: Awake/alert Behavior During Therapy: WFL for tasks assessed/performed Overall Cognitive Status: Within Functional Limits for tasks assessed                                          General Comments  Educated on safe ice use, no pivots, car transfers, and TED hose during day. Also, encouraged walking every 1-2 hours during day. Educated on HEP with focus on mobility the first weeks. Discussed doing exercises within pain control and if pain increasing could decreased ROM, reps, and stop exercises as needed. Encouraged to perform ankle pumps frequently for blood flow      Exercises Total Joint Exercises Ankle Circles/Pumps: AROM, Both, 10 reps, Supine Quad Sets: AROM, Both, 10 reps, Seated Heel Slides: AAROM, Right, 10 reps, Seated (reclined, reports felt good to loosen) Hip ABduction/ADduction: AAROM, Right, Seated, 5 reps Long Arc Quad: AROM, Right, 5 reps, Seated   Assessment/Plan  PT Assessment Patient needs continued PT services  PT Problem List Decreased strength;Decreased range of motion;Decreased activity tolerance;Decreased balance;Decreased mobility;Decreased knowledge of precautions;Decreased safety awareness;Decreased knowledge of use of DME;Pain       PT Treatment Interventions DME instruction;Therapeutic exercise;Gait training;Balance training;Stair training;Functional mobility training;Therapeutic activities;Patient/family education;Modalities    PT Goals (Current goals can be found in the Care Plan section)  Acute Rehab PT Goals Patient Stated Goal: return home PT Goal Formulation: With patient Time For Goal Achievement:  09/05/22 Potential to Achieve Goals: Good    Frequency 7X/week     Co-evaluation               AM-PAC PT "6 Clicks" Mobility  Outcome Measure Help needed turning from your back to your side while in a flat bed without using bedrails?: A Little Help needed moving from lying on your back to sitting on the side of a flat bed without using bedrails?: A Little Help needed moving to and from a bed to a chair (including a wheelchair)?: A Little Help needed standing up from a chair using your arms (e.g., wheelchair or bedside chair)?: A Little Help needed to walk in hospital room?: A Little Help needed climbing 3-5 steps with a railing? : A Little 6 Click Score: 18    End of Session Equipment Utilized During Treatment: Gait belt Activity Tolerance: Patient tolerated treatment well Patient left: with call bell/phone within reach;in chair;with chair alarm set Nurse Communication: Mobility status PT Visit Diagnosis: Other abnormalities of gait and mobility (R26.89);Muscle weakness (generalized) (M62.81)    Time: [id number] PT Time Calculation (min) (ACUTE ONLY): 36 min   Charges:   PT Evaluation $PT Eval Low Complexity: 1 Low PT Treatments $Gait Training: 8-22 mins        Abran Richard, PT Acute Rehab [facility] [phone]   Karlton Lemon 08/22/2022, 11:41 AM

## 2022-08-22 NOTE — Progress Notes (Signed)
Physical Therapy Treatment Patient Details Name: PRIYAN KIPPEN MRN: [id number] DOB: [birth year] Today's Date: 08/22/2022   History of Present Illness Pt is 77 yo male admitted 08/21/22 for R anterior THA.  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    PT Comments    Pt making good improvement this afternoon with improved posture, RW proximity , and stability with gait.  He was able to ambulate 200' with supervision and RW.  Pt's son present.  Pt and son confirmed that pt could/would have assist for a few days to week after d/c.    Pt demonstrates safe gait & transfers , now with home support confirmed, in order to return home from PT perspective once discharged by MD.  While in hospital, will continue to benefit from PT for skilled therapy to advance mobility and exercises.     Recommendations for follow up therapy are one component of a multi-disciplinary discharge planning process, led by the attending physician.  Recommendations may be updated based on patient status, additional functional criteria and insurance authorization.  Follow Up Recommendations  Follow physician's recommendations for discharge plan and follow up therapies     Assistance Recommended at Discharge Frequent or constant Supervision/Assistance  Patient can return home with the following A little help with walking and/or transfers;A little help with bathing/dressing/bathroom;Assistance with cooking/housework;Help with stairs or ramp for entrance   Equipment Recommendations  Rolling walker (2 wheels) (has been delivered)    Recommendations for Other Services       Precautions / Restrictions Precautions Precautions: Fall Restrictions RLE Weight Bearing: Weight bearing as tolerated     Mobility  Bed Mobility Overal bed mobility: Needs Assistance Bed Mobility: Supine to Sit, Sit to Supine     Supine to sit: Supervision, HOB  elevated Sit to supine: Supervision, HOB elevated   General bed mobility comments: Pt utilizing gait belt to assist R LE with cues.  Does have adjustable bed at home    Transfers Overall transfer level: Needs assistance Equipment used: Rolling walker (2 wheels) Transfers: Sit to/from Stand Sit to Stand: Supervision           General transfer comment: Performed x 2 from chair; cues to wait for RW    Ambulation/Gait Ambulation/Gait assistance: Supervision Gait Distance (Feet): 200 Feet Assistive device: Rolling walker (2 wheels) Gait Pattern/deviations: Step-through pattern, Decreased stride length, Trunk flexed Gait velocity: decreased     General Gait Details: Improved posture but still forward; good RW proxmity without cues; steady gait   Stairs Stairs:  (no stairs at home)           Wheelchair Mobility    Modified Rankin (Stroke Patients Only)       Balance Overall balance assessment: Needs assistance Sitting-balance support: No upper extremity supported Sitting balance-Leahy Scale: Good     Standing balance support: No upper extremity supported, Bilateral upper extremity supported Standing balance-Leahy Scale: Fair Standing balance comment: RW to ambulate but could static stand without support                            Cognition Arousal/Alertness: Awake/alert Behavior During Therapy: WFL for tasks assessed/performed Overall Cognitive Status: Within Functional Limits for tasks assessed  Exercises Total Joint Exercises Ankle Circles/Pumps: AROM, Both, 10 reps, Supine Quad Sets: AROM, Both, 10 reps, Seated Heel Slides: AAROM, Right, 10 reps, Seated (gait belt to assist) Hip ABduction/ADduction: AAROM, Right, Seated, 5 reps Long Arc Quad: AROM, Right, Seated, 10 reps    General Comments        Pertinent Vitals/Pain Pain Assessment Pain Assessment: 0-10 Pain Score: 2  Pain  Location: R hip Pain Descriptors / Indicators: Discomfort Pain Intervention(s): Limited activity within patient's tolerance, Monitored during session, Ice applied    Home Living                          Prior Function            PT Goals (current goals can now be found in the care plan section) Progress towards PT goals: Progressing toward goals    Frequency    7X/week      PT Plan Current plan remains appropriate    Co-evaluation              AM-PAC PT "6 Clicks" Mobility   Outcome Measure  Help needed turning from your back to your side while in a flat bed without using bedrails?: A Little Help needed moving from lying on your back to sitting on the side of a flat bed without using bedrails?: A Little Help needed moving to and from a bed to a chair (including a wheelchair)?: A Little Help needed standing up from a chair using your arms (e.g., wheelchair or bedside chair)?: A Little Help needed to walk in hospital room?: A Little Help needed climbing 3-5 steps with a railing? : A Little 6 Click Score: 18    End of Session Equipment Utilized During Treatment: Gait belt Activity Tolerance: Patient tolerated treatment well Patient left: with call bell/phone within reach;in chair;with chair alarm set Nurse Communication: Mobility status PT Visit Diagnosis: Other abnormalities of gait and mobility (R26.89);Muscle weakness (generalized) (M62.81)     Time: [id number] PT Time Calculation (min) (ACUTE ONLY): 24 min  Charges:  $Gait Training: 8-22 mins $Therapeutic Exercise: 8-22 mins                     Abran Richard, PT Acute Rehab [facility] [place] 08/22/2022, 3:47 PM

## 2022-08-23 ENCOUNTER — Encounter (HOSPITAL_COMMUNITY): Payer: Self-pay | Admitting: Orthopedic Surgery

## 2022-09-04 NOTE — Discharge Summary (Signed)
Patient ID: Travis Blanchard MRN: [id number] DOB/AGE: 75/04/1946 75 y.o.  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 done at [facility]   COPD (chronic obstructive pulmonary disease) ([place])    Diabetes mellitus without complication ([place])    Hyperlipidemia    Hypertension    Umbilical hernia    Vitamin D deficiency     Surgeries: Procedure(s): TOTAL HIP ARTHROPLASTY ANTERIOR APPROACH on 08/21/2022   Consultants:   Discharged Condition: Improved  Hospital Course: THOMASJAMES PETRELLA is an 77 y.o. male who was admitted 08/21/2022 for operative treatment ofS/P total right hip arthroplasty. Patient has severe unremitting pain that affects sleep, daily activities, and work/hobbies. After pre-op clearance the patient was taken to the operating room on 08/21/2022 and underwent  Procedure(s): TOTAL HIP ARTHROPLASTY ANTERIOR APPROACH.    Patient was given perioperative antibiotics:  Anti-infectives (From admission, onward)    Start     Dose/Rate Route Frequency Ordered Stop   08/22/22 0600  vancomycin (VANCOREADY) IVPB 1500 mg/300 mL        1,500 mg 150 mL/hr over 120 Minutes Intravenous On call to O.R. 08/21/22 1213 08/21/22 1501   08/21/22 2200  vancomycin (VANCOCIN) IVPB 1000 mg/200 mL premix        1,000 mg 200 mL/hr over 60 Minutes Intravenous Every 12 hours 08/21/22 1801 08/21/22 2302   08/21/22 1430  gentamicin (GARAMYCIN) 460 mg in dextrose 5 % 100 mL IVPB        5 mg/kg  91.4 kg (Adjusted) 111.5 mL/hr over 60 Minutes Intravenous  Once 08/21/22 1420 08/21/22 1514   08/21/22 1215  vancomycin (VANCOCIN) IVPB 1000 mg/200 mL premix  Status:  Discontinued         1,000 mg 200 mL/hr over 60 Minutes Intravenous  Once 08/21/22 1213 08/21/22 1403        Patient was given sequential compression devices, early ambulation, and chemoprophylaxis to prevent DVT. Patient worked with PT and was meeting their goals regarding safe ambulation and transfers.  Patient benefited maximally from hospital stay and there were no complications.    Recent vital signs: No data found.   Recent laboratory studies: No results for input(s): "WBC", "HGB", "HCT", "PLT", "NA", "K", "CL", "CO2", "BUN", "CREATININE", "GLUCOSE", "INR", "CALCIUM" in the last 72 hours.  Invalid input(s): "PT", "2"   Discharge Medications:   Allergies as of 08/22/2022       Reactions   Lisinopril Swelling   Facial swelling   Penicillins Anaphylaxis, Hives, Shortness Of Breath, Itching, Other (See Comments)   Has patient had a PCN reaction causing immediate rash, facial/tongue/throat swelling, SOB or lightheadedness with hypotension: Yes Has patient had a PCN reaction causing severe rash involving mucus membranes or skin necrosis: No Has patient had a PCN reaction that required hospitalization: No Has patient had a PCN reaction occurring within the last 10 years: No If all of the above answers are "NO", then may proceed with Cephalosporin use.        Medication List     STOP taking these medications    acetaminophen 325 MG tablet Commonly known as: TYLENOL  TAKE these medications    albuterol 108 (90 Base) MCG/ACT inhaler Commonly known as: Ventolin HFA Inhale 1 puff into the lungs every 6 (six) hours as needed for wheezing or shortness of breath.   amLODipine 5 MG tablet Commonly known as: NORVASC Take 5 mg by mouth daily.   aspirin 81 MG chewable tablet Chew 1 tablet (81 mg total) by mouth 2 (two) times daily for 28 days.   atorvastatin 20 MG tablet Commonly known as: LIPITOR Take 20 mg by mouth daily.   BARIATRIC MULTIVITAMINS/IRON PO Take 3 capsules by mouth  daily.   CALCIUM-VITAMIN D PO Take 1 tablet by mouth in the morning and at bedtime.   cyanocobalamin 1000 MCG tablet Commonly known as: VITAMIN B12 Take 1,000 mcg by mouth daily.   fluticasone 50 MCG/ACT nasal spray Commonly known as: FLONASE Place 1 spray into both nostrils daily as needed for allergies.   HYDROcodone-acetaminophen 5-325 MG tablet Commonly known as: NORCO/VICODIN Take 1 tablet by mouth every 4 (four) hours as needed for severe pain.   ipratropium-albuterol 0.5-2.5 (3) MG/3ML Soln Commonly known as: DUONEB Take 3 mLs by nebulization every 6 (six) hours as needed (shortness of breath).   loratadine 10 MG tablet Commonly known as: CLARITIN Take 10 mg by mouth daily.   methocarbamol 500 MG tablet Commonly known as: ROBAXIN Take 1 tablet (500 mg total) by mouth every 6 (six) hours as needed for muscle spasms.   omeprazole 20 MG capsule Commonly known as: PRILOSEC Take 20 mg by mouth daily as needed (acid reflux).   polyethylene glycol 17 g packet Commonly known as: MIRALAX / GLYCOLAX Take 17 g by mouth 2 (two) times daily.   psyllium 58.6 % powder Commonly known as: METAMUCIL Take 0.5 packets by mouth at bedtime.   TURMERIC PO Take 1 capsule by mouth daily.   Vitamin D3 25 MCG (1000 UT) capsule Generic drug: Cholecalciferol Take 1,000 Units by mouth daily.   zolpidem 10 MG tablet Commonly known as: AMBIEN Take 10 mg by mouth at bedtime.               Discharge Care Instructions  (From admission, onward)           Start     Ordered   08/22/22 0000  Change dressing       Comments: Maintain surgical dressing until follow up in the clinic. If the edges start to pull up, may reinforce with tape. If the dressing is no longer working, may remove and cover with gauze and tape, but must keep the area dry and clean.  Call with any questions or concerns.   08/22/22 0829            Diagnostic Studies: DG HIP UNILAT WITH PELVIS 1V  RIGHT  Result Date: 08/21/2022 CLINICAL DATA:  Status post right total hip arthroplasty. EXAM: DG HIP (WITH OR WITHOUT PELVIS) 1V RIGHT; DG C-ARM 1-60 MIN-NO REPORT Radiation exposure index: 3.2468 mGy. COMPARISON:  Dec 07, 2019. FINDINGS: Two intraoperative fluoroscopic images were obtained of the right hip. These demonstrate the right acetabular and femoral components to be well situated. IMPRESSION: Fluoroscopic guidance provided during right total hip arthroplasty. Electronically Signed   By: Marijo Conception M.D.   On: 08/21/2022 15:17   DG C-Arm 1-60 Min-No Report  Result Date: 08/21/2022 Fluoroscopy was utilized by the requesting physician.  No radiographic interpretation.   DG C-Arm 1-60 Min-No Report  Result Date: 08/21/2022 Fluoroscopy was utilized by the  requesting physician.  No radiographic interpretation.    Disposition: Discharge disposition: 01-Home or Self Care       Discharge Instructions     Call MD / Call 911   Complete by: As directed    If you experience chest pain or shortness of breath, CALL 911 and be transported to the hospital emergency room.  If you develope a fever above 101 F, pus (white drainage) or increased drainage or redness at the wound, or calf pain, call your surgeon's office.   Change dressing   Complete by: As directed    Maintain surgical dressing until follow up in the clinic. If the edges start to pull up, may reinforce with tape. If the dressing is no longer working, may remove and cover with gauze and tape, but must keep the area dry and clean.  Call with any questions or concerns.   Constipation Prevention   Complete by: As directed    Drink plenty of fluids.  Prune juice may be helpful.  You may use a stool softener, such as Colace (over the counter) 100 mg twice a day.  Use MiraLax (over the counter) for constipation as needed.   Diet - low sodium heart healthy   Complete by: As directed    Increase activity slowly as tolerated   Complete  by: As directed    Weight bearing as tolerated with assist device (walker, cane, etc) as directed, use it as long as suggested by your surgeon or therapist, typically at least 4-6 weeks.   Post-operative opioid taper instructions:   Complete by: As directed    POST-OPERATIVE OPIOID TAPER INSTRUCTIONS: It is important to wean off of your opioid medication as soon as possible. If you do not need pain medication after your surgery it is ok to stop day one. Opioids include: Codeine, Hydrocodone(Norco, Vicodin), Oxycodone(Percocet, oxycontin) and hydromorphone amongst others.  Long term and even short term use of opiods can cause: Increased pain response Dependence Constipation Depression Respiratory depression And more.  Withdrawal symptoms can include Flu like symptoms Nausea, vomiting And more Techniques to manage these symptoms Hydrate well Eat regular healthy meals Stay active Use relaxation techniques(deep breathing, meditating, yoga) Do Not substitute Alcohol to help with tapering If you have been on opioids for less than two weeks and do not have pain than it is ok to stop all together.  Plan to wean off of opioids This plan should start within one week post op of your joint replacement. Maintain the same interval or time between taking each dose and first decrease the dose.  Cut the total daily intake of opioids by one tablet each day Next start to increase the time between doses. The last dose that should be eliminated is the evening dose.      TED hose   Complete by: As directed    Use stockings (TED hose) for 2 weeks on both leg(s).  You may remove them at night for sleeping.        Follow-up Information     Paralee Cancel, MD. Schedule an appointment as soon as possible for a visit in 2 week(s).   Specialty: Orthopedic Surgery Why: [phone] Contact information: [address] [place] [address] [id number]                   Signed: Irving Copas 09/04/2022, 8:01 AM

## 2022-09-16 DIAGNOSIS — E118 Type 2 diabetes mellitus with unspecified complications: Secondary | ICD-10-CM | POA: Diagnosis not present

## 2022-09-16 DIAGNOSIS — I10 Essential (primary) hypertension: Secondary | ICD-10-CM | POA: Diagnosis not present

## 2022-09-18 ENCOUNTER — Other Ambulatory Visit (HOSPITAL_COMMUNITY): Payer: Self-pay

## 2022-10-10 DIAGNOSIS — Z471 Aftercare following joint replacement surgery: Secondary | ICD-10-CM | POA: Diagnosis not present

## 2022-10-10 DIAGNOSIS — Z96641 Presence of right artificial hip joint: Secondary | ICD-10-CM | POA: Diagnosis not present

## 2022-10-17 DIAGNOSIS — E118 Type 2 diabetes mellitus with unspecified complications: Secondary | ICD-10-CM | POA: Diagnosis not present

## 2022-10-17 DIAGNOSIS — I10 Essential (primary) hypertension: Secondary | ICD-10-CM | POA: Diagnosis not present

## 2022-10-17 DIAGNOSIS — M19012 Primary osteoarthritis, left shoulder: Secondary | ICD-10-CM | POA: Diagnosis not present

## 2022-10-30 DIAGNOSIS — M19012 Primary osteoarthritis, left shoulder: Secondary | ICD-10-CM | POA: Diagnosis not present

## 2022-10-31 DIAGNOSIS — Z96641 Presence of right artificial hip joint: Secondary | ICD-10-CM | POA: Diagnosis not present

## 2022-10-31 DIAGNOSIS — Z471 Aftercare following joint replacement surgery: Secondary | ICD-10-CM | POA: Diagnosis not present

## 2022-11-16 DIAGNOSIS — E118 Type 2 diabetes mellitus with unspecified complications: Secondary | ICD-10-CM | POA: Diagnosis not present

## 2022-11-16 DIAGNOSIS — I10 Essential (primary) hypertension: Secondary | ICD-10-CM | POA: Diagnosis not present

## 2022-11-27 DIAGNOSIS — M19012 Primary osteoarthritis, left shoulder: Secondary | ICD-10-CM | POA: Diagnosis not present

## 2022-12-06 DIAGNOSIS — N50811 Right testicular pain: Secondary | ICD-10-CM | POA: Diagnosis not present

## 2022-12-06 DIAGNOSIS — C61 Malignant neoplasm of prostate: Secondary | ICD-10-CM | POA: Diagnosis not present

## 2022-12-13 ENCOUNTER — Other Ambulatory Visit (HOSPITAL_COMMUNITY): Payer: Self-pay | Admitting: Urology

## 2022-12-13 DIAGNOSIS — C61 Malignant neoplasm of prostate: Secondary | ICD-10-CM

## 2022-12-17 DIAGNOSIS — I10 Essential (primary) hypertension: Secondary | ICD-10-CM | POA: Diagnosis not present

## 2022-12-17 DIAGNOSIS — E118 Type 2 diabetes mellitus with unspecified complications: Secondary | ICD-10-CM | POA: Diagnosis not present

## 2022-12-20 ENCOUNTER — Encounter: Payer: Self-pay | Admitting: Skilled Nursing Facility1

## 2022-12-20 ENCOUNTER — Encounter: Payer: Medicare Other | Attending: General Surgery | Admitting: Skilled Nursing Facility1

## 2022-12-20 DIAGNOSIS — E119 Type 2 diabetes mellitus without complications: Secondary | ICD-10-CM | POA: Insufficient documentation

## 2022-12-20 NOTE — Progress Notes (Signed)
Bariatric Nutrition Follow-Up Visit Medical Nutrition Therapy   Surgery date: 03/06/2022 Surgery type: sleeve  NUTRITION ASSESSMENT   Anthropometrics  Start weight at [facility]: 288 lbs (date: 12/22/2020)  Weight: pt declined  Clinical  Medical hx: prostate cancer, diabetes, HTN, hyperlipidemia, COPD, gastric ulcer Medications: no longer on metformin   Labs: Notable signs/symptoms: none identified  Any previous deficiencies? Yes, vitmain B12 Bowel Habits: Every day to every other day no complaints   Lifestyle & Dietary Hx   Pt states he got his new hip! Pt states he is feeling really good and sleeping well.  Pt states he takes his grandson to camp in the morning. Pt states his grandson lives with them for the summer.  Pt states he does the grocery shopping and making of the food. Pt states he slacked off his water he is recognizing.     Estimated daily fluid intake: 46 oz Estimated daily protein intake: 80+ g Supplements: multivitamin and calcium Current average weekly physical activity: 30 minutes through the house walking not ready to go outside with this new hip yet  24-Hr Dietary Recall First Meal 10:30-11: 1 packet oatmeal + berries + cinnamon  Snack:  nuts Second Meal: 1 apple or pack of crackers  Snack:   Third Meal: chicken, shrimp, steak or ribs + peppers + onions + oil + broccoli + cauliflower + celery + salad dressing and french fries Snack:  Beverages: coffee, water  Post-Op Goals/ Signs/ Symptoms Using straws: no Drinking while eating: no Chewing/swallowing difficulties: no Changes in vision: no Changes to mood/headaches: no Hair loss/changes to skin/nails: no Difficulty focusing/concentrating: no Sweating: no Limb weakness: no Dizziness/lightheadedness: no Palpitations: no  Carbonated/caffeinated beverages: no N/V/D/C/Gas: no; taking metamucil daily Abdominal pain: no Dumping syndrome: no    NUTRITION DIAGNOSIS  Overweight/obesity ([place]-3.3) related  to past poor dietary habits and physical inactivity as evidenced by completed bariatric surgery and following dietary guidelines for continued weight loss and healthy nutrition status.     NUTRITION INTERVENTION: cotninued Nutrition counseling (C-1) and education (E-2) to facilitate bariatric surgery goals, including: The importance of consuming adequate calories as well as certain nutrients daily due to the body's need for essential vitamins, minerals, and fats The importance of daily physical activity and to reach a goal of at least 150 minutes of moderate to vigorous physical activity weekly (or as directed by their physician) due to benefits such as increased musculature and improved lab values The importance of intuitive eating specifically learning hunger-satiety cues and understanding the importance of learning a new body: The importance of mindful eating to avoid grazing behaviors  Encouraged patient to honor their body's internal hunger and fullness cues.  Throughout the day, check in mentally and rate hunger. Stop eating when satisfied not full regardless of how much food is left on the plate.  Get more if still hungry 20-30 minutes later.  The key is to honor satisfaction so throughout the meal, rate fullness factor and stop when comfortably satisfied not physically full. The key is to honor hunger and fullness without any feelings of guilt or shame.  Pay attention to what the internal cues are, rather than any external factors. This will enhance the confidence you have in listening to your own body and following those internal cues enabling you to increase how often you eat when you are hungry not out of appetite and stop when you are satisfied not full.  Encouraged pt to continue to eat balanced meals inclusive of non starchy  vegetables 2 times a day 7 days a week Encouraged pt to choose lean protein sources: limiting beef, pork, sausage, hotdogs, and lunch meat Encourage pt to choose healthy  fats such as plant based limiting animal fats Encouraged pt to continue to drink a minium 64 fluid ounces with half being plain water to satisfy proper hydration  -watch those limits: limit yourself to 1/4 cup per day To have the fastest weight loss completely avoid eating sweets   Learning Style & Readiness for Change Teaching method utilized: Visual & Auditory  Demonstrated degree of understanding via: Teach Back  Readiness Level: action Barriers to learning/adherence to lifestyle change: none currently   RD's Notes for Next Visit Assess adherence to pt chosen goals    MONITORING & EVALUATION Dietary intake, weekly physical activity, body weight  Next Steps Patient is to follow-up in August

## 2022-12-31 ENCOUNTER — Ambulatory Visit (HOSPITAL_COMMUNITY): Admission: RE | Admit: 2022-12-31 | Payer: Medicare Other | Source: Ambulatory Visit

## 2023-01-01 ENCOUNTER — Ambulatory Visit (HOSPITAL_COMMUNITY)
Admission: RE | Admit: 2023-01-01 | Discharge: 2023-01-01 | Disposition: A | Discharge status: Home / Self Care | Payer: Medicare Other | Source: Ambulatory Visit | Attending: Urology | Admitting: Urology

## 2023-01-01 DIAGNOSIS — R9721 Rising PSA following treatment for malignant neoplasm of prostate: Secondary | ICD-10-CM | POA: Diagnosis not present

## 2023-01-01 DIAGNOSIS — C61 Malignant neoplasm of prostate: Secondary | ICD-10-CM | POA: Insufficient documentation

## 2023-01-01 MED ORDER — PIFLIFOLASTAT F 18 (PYLARIFY) INJECTION
9.0000 | Freq: Once | INTRAVENOUS | Status: AC
  Administered 2023-01-01: 8.43 via INTRAVENOUS

## 2023-01-08 ENCOUNTER — Telehealth: Payer: Self-pay

## 2023-01-08 NOTE — Telephone Encounter (Signed)
-----   Message from Raquel Sarna sent at 01/04/2023  2:45 PM EDT ----- Friday Fax

## 2023-01-08 NOTE — Telephone Encounter (Signed)
Return call to patient. Patient states he wanted to get records for a provider that remove his prostate 25 years ago but unsure of the provider name. Made patient aware to reach out to medical records. Patient voiced understanding

## 2023-01-17 ENCOUNTER — Telehealth: Payer: Self-pay | Admitting: Gastroenterology

## 2023-01-17 DIAGNOSIS — K458 Other specified abdominal hernia without obstruction or gangrene: Secondary | ICD-10-CM

## 2023-01-17 DIAGNOSIS — Z1211 Encounter for screening for malignant neoplasm of colon: Secondary | ICD-10-CM

## 2023-01-17 DIAGNOSIS — Z86010 Personal history of colonic polyps: Secondary | ICD-10-CM

## 2023-01-17 NOTE — Telephone Encounter (Signed)
Patient had and elevated PSA, he had a prostatectomy approximately 25 years ago. He has been referred to Dr. Kathrynn Running to be evaluated. PET scan was 6/25 showing:  MPRESSION: 1. No evidence local prostate cancer recurrence in the prostatectomy bed. 2. No evidence metastatic adenopathy in the pelvis or periaortic retroperitoneum. 3. No evidence of visceral metastasis or skeletal metastasis. 4. 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  Patient is calling to discuss a repeat Virtual CT scan.  Last was 11/07/20 with no significant polyps.  He is very concerned about a potential cancer in his colon.  He denies any GI complaints at this time.  He has a follow up appointment with GM on 03/27/23 to discuss a possible virtual CT. There are no earlier appts with GM or APP prior to this time.  Patient is asking in you will consider ordering a repeat virtual CT prior to appointment in September.    I advised the patient that Dr. Meridee Score is off this pm and tomorrow am, but we will get back with him once Dr. Meridee Score has had the opportunity to review.

## 2023-01-17 NOTE — Telephone Encounter (Signed)
Patient stopped by 3rd floor, states his "psa" blood levels are abnormal. He is scheduled to see Dr. Meridee Score in September. He is requesting to speak with a nurse about his concerns as he is wishing to get in sooner if possible. Please advise.

## 2023-01-18 NOTE — Telephone Encounter (Signed)
Patient has been scheduled and notified of virtual CT colonoscopy for 02/18/23 8:00 at [address].  He verbalizes understanding to pick up his prep at least 4 days prior to scan.  He has been notified that we will work on the authorization, but that this may not be covered by his insurance.  He understands and that he will be notified if it is not covered and he can decide at that time if he wants to pay out of pocket.   He confirms he is not having any GI problems at this time and the appointment that was scheduled in September was to discuss this scan. He have decided to cancel that appointment and we will call him with the results of the CT.

## 2023-01-18 NOTE — Addendum Note (Signed)
Addended by: Annett Fabian on: 01/18/2023 10:41 AM   Modules accepted: Orders

## 2023-01-18 NOTE — Telephone Encounter (Signed)
This patient's anatomy with the large midline ventral hernia and 2 failed prior colonoscopies including an advanced endoscopist at [facility] make it unlikely that without his ventral hernia being repaired, that repeat colonoscopy would be actually possible.  He was supposed to see surgery years ago to consider ventral hernia repair. If insurance will cover a virtual colonoscopy I am okay with that being performed, with the caveats that as he had last time the transverse colon hernia may make things more difficult to do true virtual colonoscopy maneuvers. I would recommend that the patient also take MiraLAX daily and Dulcolax every other day the week before his virtual colonoscopy to try to make sure that his bowels are as clean as possible before he does the virtual colonoscopy preparation. Thanks. GM

## 2023-01-24 DIAGNOSIS — I10 Essential (primary) hypertension: Secondary | ICD-10-CM | POA: Diagnosis not present

## 2023-01-24 DIAGNOSIS — K219 Gastro-esophageal reflux disease without esophagitis: Secondary | ICD-10-CM | POA: Diagnosis not present

## 2023-01-24 DIAGNOSIS — E118 Type 2 diabetes mellitus with unspecified complications: Secondary | ICD-10-CM | POA: Diagnosis not present

## 2023-02-04 NOTE — Progress Notes (Incomplete)
GU Location of Tumor / Histology: Prostate Cancer biochemical recurrence  Prostatectomy (2000)  PSA is 5.73 on 12/06/2022  01/01/2023 Dr. Jettie Pagan NM PET (PSMA) Skull to Mid Thigh CLINICAL DATA:  Prostate carcinoma with biochemical recurrence. Post prostatectomy 20 years prior. Rising PSA of 5.73  FINDINGS: NECK No radiotracer activity in neck lymph nodes.   Incidental CT finding: None.   CHEST No radiotracer accumulation within mediastinal or hilar lymph nodes. No suspicious pulmonary nodules on the CT scan.   Incidental CT finding: None.   ABDOMEN/PELVIS Prostate: No focal activity in the prostatectomy bed. Lymph nodes: No abnormal radiotracer accumulation within pelvic or abdominal nodes.   Liver: No evidence of liver metastasis.   Incidental CT finding: Large ventral hernia at the level of the umbilicus. Hernia sac is larger measures 11.7 cm and bulges the ventral abdominal surface. Hernia neck is relatively narrow at 2.7 cm. There is a long segment of nonobstructed small transverse colon within the hernia.   SKELETON No focal activity to suggest skeletal metastasis.  IMPRESSION: 1. No evidence local prostate cancer recurrence in the prostatectomy bed. 2. No evidence metastatic adenopathy in the pelvis or periaortic retroperitoneum. 3. No evidence of visceral metastasis or skeletal metastasis. 4. 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  Past/Anticipated interventions by urology, if any: NA  Past/Anticipated interventions by medical oncology, if any: NA  Weight changes, if any: {:18581}  IPSS: SHIM:  Bowel/Bladder complaints, if any: {:18581}   Nausea/Vomiting, if any: {:18581}  Pain issues, if any:  {:18581}  SAFETY ISSUES: Prior radiation? {:18581} Pacemaker/ICD? {:18581} Possible current pregnancy? Male Is the patient on methotrexate? No  Current Complaints / other details:

## 2023-02-06 ENCOUNTER — Ambulatory Visit: Admission: RE | Admit: 2023-02-06 | Payer: Medicare Other | Source: Ambulatory Visit | Admitting: Radiation Oncology

## 2023-02-06 ENCOUNTER — Other Ambulatory Visit: Payer: Self-pay

## 2023-02-06 ENCOUNTER — Ambulatory Visit
Admission: RE | Admit: 2023-02-06 | Discharge: 2023-02-06 | Disposition: A | Discharge status: Home / Self Care | Payer: Medicare Other | Source: Ambulatory Visit | Attending: Radiation Oncology | Admitting: Radiation Oncology

## 2023-02-06 ENCOUNTER — Encounter: Payer: Self-pay | Admitting: Radiation Oncology

## 2023-02-06 VITALS — BP 136/83 | HR 91 | Temp 97.7°F | Resp 18 | Ht 70.0 in | Wt 258.5 lb

## 2023-02-06 DIAGNOSIS — E119 Type 2 diabetes mellitus without complications: Secondary | ICD-10-CM | POA: Insufficient documentation

## 2023-02-06 DIAGNOSIS — K439 Ventral hernia without obstruction or gangrene: Secondary | ICD-10-CM | POA: Insufficient documentation

## 2023-02-06 DIAGNOSIS — J45909 Unspecified asthma, uncomplicated: Secondary | ICD-10-CM | POA: Diagnosis not present

## 2023-02-06 DIAGNOSIS — M129 Arthropathy, unspecified: Secondary | ICD-10-CM | POA: Diagnosis not present

## 2023-02-06 DIAGNOSIS — E559 Vitamin D deficiency, unspecified: Secondary | ICD-10-CM | POA: Insufficient documentation

## 2023-02-06 DIAGNOSIS — E785 Hyperlipidemia, unspecified: Secondary | ICD-10-CM | POA: Diagnosis not present

## 2023-02-06 DIAGNOSIS — C61 Malignant neoplasm of prostate: Secondary | ICD-10-CM | POA: Diagnosis not present

## 2023-02-06 DIAGNOSIS — K449 Diaphragmatic hernia without obstruction or gangrene: Secondary | ICD-10-CM | POA: Diagnosis not present

## 2023-02-06 DIAGNOSIS — J449 Chronic obstructive pulmonary disease, unspecified: Secondary | ICD-10-CM | POA: Insufficient documentation

## 2023-02-06 DIAGNOSIS — N50811 Right testicular pain: Secondary | ICD-10-CM | POA: Diagnosis not present

## 2023-02-06 DIAGNOSIS — I10 Essential (primary) hypertension: Secondary | ICD-10-CM | POA: Insufficient documentation

## 2023-02-06 DIAGNOSIS — Z87891 Personal history of nicotine dependence: Secondary | ICD-10-CM | POA: Diagnosis not present

## 2023-02-06 DIAGNOSIS — E538 Deficiency of other specified B group vitamins: Secondary | ICD-10-CM | POA: Diagnosis not present

## 2023-02-06 DIAGNOSIS — Z8546 Personal history of malignant neoplasm of prostate: Secondary | ICD-10-CM | POA: Diagnosis not present

## 2023-02-06 DIAGNOSIS — Z191 Hormone sensitive malignancy status: Secondary | ICD-10-CM | POA: Diagnosis not present

## 2023-02-06 DIAGNOSIS — Z79899 Other long term (current) drug therapy: Secondary | ICD-10-CM | POA: Diagnosis not present

## 2023-02-06 NOTE — Progress Notes (Signed)
Radiation Oncology         [phone] ________________________________  Initial Outpatient Consultation  Name: Travis Blanchard MRN: [id number]  Date: 02/06/2023  DOB: 05-07-1946  NW:GNFAO, [name], MD  Jannifer Hick, MD   REFERRING PHYSICIAN: Jannifer Hick, MD  DIAGNOSIS: 77 y.o. 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 Malignant neoplasm of prostate (HCC)  C61     2. Ventral hernia without obstruction or gangrene  K43.9 Ambulatory referral to General Surgery      HISTORY OF PRESENT ILLNESS: Travis Blanchard is a 77 y.o. male who was originally diagnosed with prostate cancer and underwent an open prostatectomy in 2007 under the care of Dr. Annabell Howells. We have reached out to [facility], but they do not have his records available. He was lost to subsequent follow up.   Most recently, he was seen by Dr. Cardell Peach on 12/06/22 for right testicular pain. Dr. Cardell Peach recommended follow-up PSA which was elevated at 5.73.  He underwent PSMA PET scan on 01/01/23 which showed some uptake beneath the bladder. It is unclear if this is activity in the urethra or evidence of local recurrence. Fortunately, no evidence of metastatic disease was seen.   The patient has kindly been referred today for discussion of potential radiation treatment options.  Of note, his testicular pain has since improved. The pain is intermittent and subsides with urination.   PREVIOUS RADIATION THERAPY: No  PAST MEDICAL HISTORY:  Past Medical History:  Diagnosis Date   Arthritis    Asthma    B12 deficiency    Cancer (HCC)    Prostate   Complication of anesthesia    had spinal without patient being sedated at all. done at [facility]   COPD (chronic obstructive pulmonary disease) (HCC)    Diabetes mellitus without complication (HCC)    Hyperlipidemia    Hypertension    Umbilical hernia    Vitamin D deficiency       PAST SURGICAL  HISTORY: Past Surgical History:  Procedure Laterality Date   COLONOSCOPY  2008   Dr. Jena Gauss: 2 diminutive polyps in rectum (hyperplastic), otherwise normal   COLONOSCOPY  2000   Per notes in epic, history of adenomas at outside facility   COLONOSCOPY WITH PROPOFOL N/A 07/18/2017   Procedure: COLONOSCOPY WITH PROPOFOL;  Surgeon: Corbin Ade, MD;  Location: [facility];  Service: Endoscopy;  Laterality: N/A;  8:45   HEMORRHOID SURGERY     age 89   KNEE ARTHROSCOPY Right 1985   KNEE ARTHROSCOPY Right 1990   LAPAROSCOPIC GASTRIC SLEEVE RESECTION N/A 03/06/2022   Procedure: LAPAROSCOPIC SLEEVE GASTRECTOMY;  Surgeon: Gaynelle Adu, MD;  Location: [facility];  Service: General;  Laterality: N/A;   PROSTATE SURGERY  2000   Prostate removal/Cancer   TOTAL HIP ARTHROPLASTY Right 08/21/2022   Procedure: TOTAL HIP ARTHROPLASTY ANTERIOR APPROACH;  Surgeon: Durene Romans, MD;  Location: [facility];  Service: Orthopedics;  Laterality: Right;   UPPER GI ENDOSCOPY N/A 03/06/2022   Procedure: UPPER GI ENDOSCOPY;  Surgeon: Gaynelle Adu, MD;  Location: [facility];  Service: General;  Laterality: N/A;    FAMILY HISTORY:  Family History  Problem Relation Age of Onset   Diabetes Mother    Hypertension Mother    Hyperlipidemia Mother    Stroke Mother    Heart disease Mother    Depression Mother    Obesity Mother  Eating disorder Mother    Cancer Father    Depression Father    Anxiety disorder Father    Liver disease Father    Alcoholism Father    Pancreatic cancer Father    Cancer Sister        Breast   Diabetes Sister    Colon cancer Neg Hx    Esophageal cancer Neg Hx    Inflammatory bowel disease Neg Hx    Rectal cancer Neg Hx    Stomach cancer Neg Hx     SOCIAL HISTORY:  Social History   Socioeconomic History   Marital status: Married    Spouse name: Reco Iseminger   Number of children: Not on file   Years of education: Not on file   Highest education level: Not on file  Occupational  History   Occupation: Bailbondsman  Tobacco Use   Smoking status: Former    Current packs/day: 0.00    Average packs/day: 1 pack/day for 25.0 years (25.0 ttl pk-yrs)    Types: Cigarettes    Start date: 03/11/1977    Quit date: 03/11/2002    Years since quitting: 20.9   Smokeless tobacco: Never  Vaping Use   Vaping status: Never Used  Substance and Sexual Activity   Alcohol use: Yes    Comment: 6 pack of beer per month   Drug use: No   Sexual activity: Not Currently  Other Topics Concern   Not on file  Social History Narrative   Lives with wife.     Social Determinants of Health   Financial Resource Strain: Not on file  Food Insecurity: No Food Insecurity (02/06/2023)   Hunger Vital Sign    Worried About Running Out of Food in the Last Year: Never true    Ran Out of Food in the Last Year: Never true  Transportation Needs: No Transportation Needs (02/06/2023)   PRAPARE - Administrator, Civil Service (Medical): No    Lack of Transportation (Non-Medical): No  Physical Activity: Not on file  Stress: Not on file  Social Connections: Not on file  Intimate Partner Violence: Not At Risk (02/06/2023)   Humiliation, Afraid, Rape, and Kick questionnaire    Fear of Current or Ex-Partner: No    Emotionally Abused: No    Physically Abused: No    Sexually Abused: No    ALLERGIES: Lisinopril and Penicillins  MEDICATIONS:  Current Outpatient Medications  Medication Sig Dispense Refill   albuterol (VENTOLIN HFA) 108 (90 Base) MCG/ACT inhaler Inhale 1 puff into the lungs every 6 (six) hours as needed for wheezing or shortness of breath. 1 Inhaler 0   amLODipine (NORVASC) 5 MG tablet Take 5 mg by mouth daily.     atorvastatin (LIPITOR) 20 MG tablet Take 20 mg by mouth daily.      CALCIUM-VITAMIN D PO Take 1 tablet by mouth in the morning and at bedtime.     Cholecalciferol (VITAMIN D3) 1000 units CAPS Take 1,000 Units by mouth daily.     fluticasone (FLONASE) 50 MCG/ACT nasal  spray Place 1 spray into both nostrils daily as needed for allergies.     ipratropium-albuterol (DUONEB) 0.5-2.5 (3) MG/3ML SOLN Take 3 mLs by nebulization every 6 (six) hours as needed (shortness of breath).     loratadine (CLARITIN) 10 MG tablet Take 10 mg by mouth daily.     Multiple Vitamins-Minerals (BARIATRIC MULTIVITAMINS/IRON PO) Take 3 capsules by mouth daily.     omeprazole (PRILOSEC) 20 MG  capsule Take 20 mg by mouth daily as needed (acid reflux).     polyethylene glycol (MIRALAX / GLYCOLAX) 17 g packet Take 17 g by mouth 2 (two) times daily. 14 each 0   psyllium (METAMUCIL) 58.6 % powder Take 0.5 packets by mouth at bedtime.     TURMERIC PO Take 1 capsule by mouth daily.     vitamin B-12 (CYANOCOBALAMIN) 1000 MCG tablet Take 1,000 mcg by mouth daily.     zolpidem (AMBIEN) 10 MG tablet Take 10 mg by mouth at bedtime.     No current facility-administered medications for this encounter.    REVIEW OF SYSTEMS:  On review of systems, the patient reports that he is doing well overall. He denies any chest pain, shortness of breath, cough, fevers, chills, night sweats, unintended weight changes. He denies any bowel disturbances, and denies abdominal pain, nausea or vomiting. He denies any new musculoskeletal or joint aches or pains. His IPSS was 23, indicating severe urinary symptoms.  He refused to fill out the SHIM. A complete review of systems is obtained and is otherwise negative.   PHYSICAL EXAM:  Wt Readings from Last 3 Encounters:  02/06/23 258 lb 8 oz (117.3 kg)  08/21/22 262 lb 5.6 oz (119 kg)  08/17/22 264 lb 6.4 oz (119.9 kg)   Temp Readings from Last 3 Encounters:  02/06/23 97.7 F (36.5 C) (Temporal)  08/22/22 98.1 F (36.7 C)  08/13/22 98.6 F (37 C) (Oral)   BP Readings from Last 3 Encounters:  02/06/23 136/83  08/22/22 120/71  08/17/22 126/68   Pulse Readings from Last 3 Encounters:  02/06/23 91  08/22/22 75  08/17/22 71   Pain Assessment Pain Score: 6   Pain Loc: Shoulder (bilateral)/10  In general this is a well appearing gentleman in no acute distress. He's alert and oriented x4 and appropriate throughout the examination. Cardiopulmonary assessment is negative for acute distress, and he exhibits normal effort.    KPS = 100  100 - Normal; no complaints; no evidence of disease. 90   - Able to carry on normal activity; minor signs or symptoms of disease. 80   - Normal activity with effort; some signs or symptoms of disease. 81   - Cares for self; unable to carry on normal activity or to do active work. 60   - Requires occasional assistance, but is able to care for most of his personal needs. 50   - Requires considerable assistance and frequent medical care. 40   - Disabled; requires special care and assistance. 30   - Severely disabled; hospital admission is indicated although death not imminent. 20   - Very sick; hospital admission necessary; active supportive treatment necessary. 10   - Moribund; fatal processes progressing rapidly. 0     - Dead  Karnofsky DA, Abelmann WH, Craver LS and Burchenal [facility] [phone]) The use of the nitrogen mustards in the palliative treatment of carcinoma: with particular reference to bronchogenic carcinoma Cancer 1 634-56  LABORATORY DATA:  Lab Results  Component Value Date   WBC 11.9 (H) 08/22/2022   HGB 12.3 (L) 08/22/2022   HCT 37.7 (L) 08/22/2022   MCV 89.8 08/22/2022   PLT 169 08/22/2022   Lab Results  Component Value Date   NA 137 08/22/2022   K 3.9 08/22/2022   CL 105 08/22/2022   CO2 24 08/22/2022   Lab Results  Component Value Date   ALT 22 03/07/2022   AST 20 03/07/2022   ALKPHOS 68  03/07/2022   BILITOT 0.7 03/07/2022     RADIOGRAPHY: No results found. We personally reviewed his images.     IMPRESSION/PLAN: 1. 77 y.o. 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  We  discussed the natural history of prostate cancer.  We unfortunately do not have his records for his previous prostate cancer. His most recent imaging is unclear weather it shows disease recurrence. We are reaching out to radiology to get this clarified. We reviewed some of the evidence suggesting an advantage for patients with detectable, rising postoperative PSA, who undergo early salvage therapy which can lead to excellent results in terms of disease control and survival. We discussed radiation treatment directed to the prostatic fossa with regard to the logistics and delivery of a 7.5 week course of daily external beam radiotherapy as well as anticipated acute and long-term side effects associated with this treatment.  He was encouraged to ask questions that were answered to his stated satisfaction.  At the conclusion of our conversation, the patient is interested in moving forward with 7.5 weeks of salvage external beam therapy concurrent with ADT. He has not received his first Lupron injection. We will share our discussion with Dr. Cardell Peach and make arrangements for start of ADT prior to simulation. The patient appears to have a good understanding of his disease and our treatment recommendations which are of curative intent and is in agreement with the stated plan. Therefore, we will move forward with treatment planning accordingly, in anticipation of beginning IMRT in the near future. We enjoyed meeting him today and look forward to continuing to participate in his care.   2.        Asymptomatic, ventral hernia  Patient has a large, ventral hernia that he would like to get repaired. He has previously seen by Dr. Gaynelle Adu for a sleeve gastrectomy. We will make a referral back to him for further discussion.  We personally spent 60 minutes in this encounter including chart review, reviewing radiological studies, meeting face-to-face with the patient, entering orders and completing  documentation.     Joyice Faster, PA-C    Margaretmary Dys, MD  [facility]  Radiation Oncology Direct Dial: [phone]  Fax: [phone] [email or web address]  Skype  [email or web address]

## 2023-02-12 NOTE — Progress Notes (Signed)
RN left message for call back to review treatment recommendations and assess any navigational needs.

## 2023-02-13 ENCOUNTER — Telehealth: Payer: Self-pay | Admitting: *Deleted

## 2023-02-13 NOTE — Telephone Encounter (Signed)
CALLED PATIENT TO INFORM OF ADT APPT. FOR 03-21-23- ARRIVAL TIME- 8 AM @ DR. Dillard Essex OFFICE, SPOKE WITH PATIENT AND HE IS AWARE OF THIS APPT.

## 2023-02-14 NOTE — Progress Notes (Signed)
RN spoke with patient and updated him on new appointment time and date.  RN will continue to attempt to inquire about earlier appointment.  Pt aware.  No additional needs or barriers at this time.

## 2023-02-14 NOTE — Progress Notes (Signed)
RN contacted [facility] and was unable to move appointment to 03/05/2023 @ 3pm.   Left message with patient for call back to review change in appointment and assess any needs prior to treatment.

## 2023-02-18 ENCOUNTER — Ambulatory Visit: Payer: Medicare Other

## 2023-02-19 DIAGNOSIS — C61 Malignant neoplasm of prostate: Secondary | ICD-10-CM | POA: Diagnosis not present

## 2023-02-19 DIAGNOSIS — N50811 Right testicular pain: Secondary | ICD-10-CM | POA: Diagnosis not present

## 2023-02-20 DIAGNOSIS — M25511 Pain in right shoulder: Secondary | ICD-10-CM | POA: Diagnosis not present

## 2023-02-24 DIAGNOSIS — I10 Essential (primary) hypertension: Secondary | ICD-10-CM | POA: Diagnosis not present

## 2023-02-24 DIAGNOSIS — E118 Type 2 diabetes mellitus with unspecified complications: Secondary | ICD-10-CM | POA: Diagnosis not present

## 2023-03-01 ENCOUNTER — Telehealth: Payer: Self-pay | Admitting: *Deleted

## 2023-03-01 NOTE — Telephone Encounter (Signed)
Called patient to inform of appt. with Dr. Gaynelle Adu on 03-22-23- arrival time- 1:15 pm, Dr. Tawana Scale Office is @ [facility], spoke with patient and he is aware of this appt.

## 2023-03-06 NOTE — Progress Notes (Signed)
Patient received Eligard 45mg  on 02/19/2023 with Dr. Cardell Peach.    RN spoke with patient and reviewed next steps for daily radiation to start in approximately 2 months, will send additional information through my chart as well.    Plan of care in progress.

## 2023-03-12 ENCOUNTER — Other Ambulatory Visit: Payer: Self-pay | Admitting: Urology

## 2023-03-13 DIAGNOSIS — M19011 Primary osteoarthritis, right shoulder: Secondary | ICD-10-CM | POA: Diagnosis not present

## 2023-03-19 ENCOUNTER — Ambulatory Visit
Admission: RE | Admit: 2023-03-19 | Discharge: 2023-03-19 | Disposition: A | Discharge status: Home / Self Care | Payer: Medicare Other | Source: Ambulatory Visit | Attending: Gastroenterology | Admitting: Gastroenterology

## 2023-03-19 DIAGNOSIS — Z86010 Personal history of colonic polyps: Secondary | ICD-10-CM

## 2023-03-19 DIAGNOSIS — Z1211 Encounter for screening for malignant neoplasm of colon: Secondary | ICD-10-CM

## 2023-03-19 DIAGNOSIS — K458 Other specified abdominal hernia without obstruction or gangrene: Secondary | ICD-10-CM

## 2023-03-25 ENCOUNTER — Ambulatory Visit: Payer: Medicare Other | Admitting: Skilled Nursing Facility1

## 2023-03-25 DIAGNOSIS — C61 Malignant neoplasm of prostate: Secondary | ICD-10-CM | POA: Diagnosis not present

## 2023-03-25 DIAGNOSIS — N401 Enlarged prostate with lower urinary tract symptoms: Secondary | ICD-10-CM | POA: Diagnosis not present

## 2023-03-27 ENCOUNTER — Ambulatory Visit: Payer: Medicare Other | Admitting: Gastroenterology

## 2023-03-27 DIAGNOSIS — E118 Type 2 diabetes mellitus with unspecified complications: Secondary | ICD-10-CM | POA: Diagnosis not present

## 2023-03-27 DIAGNOSIS — I10 Essential (primary) hypertension: Secondary | ICD-10-CM | POA: Diagnosis not present

## 2023-03-28 ENCOUNTER — Encounter (HOSPITAL_BASED_OUTPATIENT_CLINIC_OR_DEPARTMENT_OTHER): Payer: Self-pay | Admitting: Urology

## 2023-03-28 ENCOUNTER — Telehealth: Payer: Self-pay | Admitting: Gastroenterology

## 2023-03-28 NOTE — Progress Notes (Addendum)
Spoke w/ via phone for pre-op interview--- pt Lab needs dos----    [employer] results------ current EKG in epic/ chart COVID test -----patient states asymptomatic no test needed Arrive at ------- 0600 on 04-05-2023 NPO after MN NO Solid Food.  Clear liquids from MN until--- 0500 Med rec completed Medications to take morning of surgery ----- claritin, lipitor, norvasc, pirlosec Diabetic medication ----- n/a Patient instructed no nail polish to be worn day of surgery Patient instructed to bring photo id and insurance card day of surgery Patient aware to have Driver (ride ) -- grandson, ved brandon (age 89) caregiver for 24 hours after surgery - wife, jennerine Patient Special Instructions ----- will do one fleet enema night before surgery. Asked to bring rescue inhaler dos Pre-Op special Instructions ----- n/a Patient verbalized understanding of instructions that were given at this phone interview. Patient denies chest pain, sob, fever, cough at the interview.

## 2023-03-28 NOTE — Telephone Encounter (Signed)
PT is calling to get CT results. Please advise.

## 2023-03-28 NOTE — Telephone Encounter (Signed)
The pt has been advised that the results have not been completed as of today.  I did let him know that I will call him as soon as able.  The pt has been advised of the information and verbalized understanding.   Travis Blanchard

## 2023-03-29 NOTE — Telephone Encounter (Signed)
Inbound call from [facility] wanting to report  a 3.2 cm soft tissue mass. A call back number for more information is [phone]. Please advise, thank you.

## 2023-03-29 NOTE — Telephone Encounter (Signed)
Inbound call from patient, returning Dr. Elesa Hacker call in regards to his results.

## 2023-03-29 NOTE — Telephone Encounter (Signed)
See results note 9/20

## 2023-04-01 ENCOUNTER — Other Ambulatory Visit: Payer: Self-pay

## 2023-04-01 DIAGNOSIS — Z86010 Personal history of colonic polyps: Secondary | ICD-10-CM

## 2023-04-01 MED ORDER — NA SULFATE-K SULFATE-MG SULF 17.5-3.13-1.6 GM/177ML PO SOLN: 1.0000 | Freq: Once | ORAL | 0 refills | Status: AC

## 2023-04-01 NOTE — Progress Notes (Signed)
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  Pt verbalized understanding.  RN will continue to follow to review GI recommendations.

## 2023-04-05 ENCOUNTER — Ambulatory Visit (HOSPITAL_BASED_OUTPATIENT_CLINIC_OR_DEPARTMENT_OTHER): Admission: RE | Admit: 2023-04-05 | Payer: Medicare Other | Source: Home / Self Care | Admitting: Urology

## 2023-04-05 DIAGNOSIS — Z01818 Encounter for other preprocedural examination: Secondary | ICD-10-CM

## 2023-04-05 HISTORY — DX: Unspecified osteoarthritis, unspecified site: M19.90

## 2023-04-05 HISTORY — DX: Personal history of adenomatous and serrated colon polyps: Z86.0101

## 2023-04-05 HISTORY — DX: Presence of spectacles and contact lenses: Z97.3

## 2023-04-05 HISTORY — DX: Personal history of colonic polyps: Z86.010

## 2023-04-05 HISTORY — DX: Incisional hernia without obstruction or gangrene: K43.2

## 2023-04-05 HISTORY — DX: Gastro-esophageal reflux disease without esophagitis: K21.9

## 2023-04-05 HISTORY — DX: Other specified chronic obstructive pulmonary disease: J44.89

## 2023-04-05 HISTORY — DX: Personal history of other specified conditions: Z87.898

## 2023-04-05 HISTORY — DX: Personal history of other infectious and parasitic diseases: Z86.19

## 2023-04-05 HISTORY — DX: Nocturia: R35.1

## 2023-04-05 HISTORY — DX: Type 2 diabetes mellitus without complications: E11.9

## 2023-04-05 SURGERY — GOLD SEED IMPLANT
Anesthesia: Monitor Anesthesia Care

## 2023-04-11 ENCOUNTER — Ambulatory Visit: Payer: Medicare Other | Admitting: Radiation Oncology

## 2023-04-12 ENCOUNTER — Ambulatory Visit: Payer: Medicare Other | Admitting: Radiation Oncology

## 2023-04-18 DIAGNOSIS — H25813 Combined forms of age-related cataract, bilateral: Secondary | ICD-10-CM | POA: Diagnosis not present

## 2023-04-18 DIAGNOSIS — E113292 Type 2 diabetes mellitus with mild nonproliferative diabetic retinopathy without macular edema, left eye: Secondary | ICD-10-CM | POA: Diagnosis not present

## 2023-04-19 NOTE — Progress Notes (Signed)
Patient will have in patient colonoscopy on 11/5.  RN will follow after to assess additional GI recommendations before proceeding with radiation.

## 2023-04-25 DIAGNOSIS — C61 Malignant neoplasm of prostate: Secondary | ICD-10-CM | POA: Diagnosis not present

## 2023-04-25 DIAGNOSIS — Z903 Acquired absence of stomach [part of]: Secondary | ICD-10-CM | POA: Diagnosis not present

## 2023-04-25 DIAGNOSIS — K432 Incisional hernia without obstruction or gangrene: Secondary | ICD-10-CM | POA: Diagnosis not present

## 2023-04-25 DIAGNOSIS — R9721 Rising PSA following treatment for malignant neoplasm of prostate: Secondary | ICD-10-CM | POA: Diagnosis not present

## 2023-04-25 DIAGNOSIS — M150 Primary generalized (osteo)arthritis: Secondary | ICD-10-CM | POA: Diagnosis not present

## 2023-04-25 DIAGNOSIS — K6389 Other specified diseases of intestine: Secondary | ICD-10-CM | POA: Diagnosis not present

## 2023-04-25 DIAGNOSIS — E66812 Obesity, class 2: Secondary | ICD-10-CM | POA: Diagnosis not present

## 2023-04-25 DIAGNOSIS — I10 Essential (primary) hypertension: Secondary | ICD-10-CM | POA: Diagnosis not present

## 2023-04-26 DIAGNOSIS — I10 Essential (primary) hypertension: Secondary | ICD-10-CM | POA: Diagnosis not present

## 2023-04-26 DIAGNOSIS — E118 Type 2 diabetes mellitus with unspecified complications: Secondary | ICD-10-CM | POA: Diagnosis not present

## 2023-04-30 ENCOUNTER — Telehealth: Payer: Self-pay | Admitting: Gastroenterology

## 2023-04-30 NOTE — Telephone Encounter (Signed)
See alternate phone note  

## 2023-04-30 NOTE — Progress Notes (Signed)
Sent message, via epic in basket, requesting orders in epic from surgeon.  

## 2023-04-30 NOTE — Patient Instructions (Addendum)
SURGICAL WAITING ROOM VISITATION  Patients having surgery or a procedure may have no more than 2 support people in the waiting area - these visitors may rotate.    Children under the age of 29 must have an adult with them who is not the patient.  Due to an increase in RSV and influenza rates and associated hospitalizations, children ages 59 and under may not visit patients in [facility] hospitals.  If the patient needs to stay at the hospital during part of their recovery, the visitor guidelines for inpatient rooms apply. Pre-op nurse will coordinate an appropriate time for 1 support person to accompany patient in pre-op.  This support person may not rotate.    Please refer to the [facility] website for the visitor guidelines for Inpatients (after your surgery is over and you are in a regular room).       Your procedure is scheduled on: Friday, Nov. 1, 2024   Report to [facility] Main Entrance    Report to admitting at 10 AM   Call this number if you have problems the morning of surgery [phone]   Do not eat food :After Midnight.   After Midnight you may have the following liquids until ______ AM/ PM DAY OF SURGERY  Water Non-Citrus Juices (without pulp, NO RED-Apple, White grape, White cranberry) Black Coffee (NO MILK/CREAM OR CREAMERS, sugar ok)  Clear Tea (NO MILK/CREAM OR CREAMERS, sugar ok) regular and decaf                             Plain Jell-O (NO RED)                                           Fruit ices (not with fruit pulp, NO RED)                                     Popsicles (NO RED)                                                               Sports drinks like Gatorade (NO RED)              Drink 2 Ensure/G2 drinks AT 10:00 PM the night before surgery.        The day of surgery:  Drink ONE (1) Pre-Surgery Clear Ensure or G2 at AM the morning of surgery. Drink in one sitting. Do not sip.  This drink was given to you during your hospital   pre-op appointment visit. Nothing else to drink after completing the  Pre-Surgery Clear Ensure or G2.          If you have questions, please contact your surgeon's office.   FOLLOW BOWEL PREP AND ANY ADDITIONAL PRE OP INSTRUCTIONS YOU RECEIVED FROM YOUR SURGEON'S OFFICE!!!     Oral Hygiene is also important to reduce your risk of infection.  Remember - BRUSH YOUR TEETH THE MORNING OF SURGERY WITH YOUR REGULAR TOOTHPASTE  DENTURES WILL BE REMOVED PRIOR TO SURGERY PLEASE DO NOT APPLY "Poly grip" OR ADHESIVES!!!   Do NOT smoke after Midnight   Stop all vitamins and herbal supplements 7 days before surgery.   Take these medicines the morning of surgery with A SIP OF WATER:  Tylenol if needed            Amlodipine            Atorvastatin            Loratadine            Flonase if needed            Bring Asthma Inhaler         DO NOT TAKE ANY ORAL DIABETIC MEDICATIONS DAY OF YOUR SURGERY  Bring CPAP mask and tubing day of surgery.                              You may not have any metal on your body including jewelry, and body piercing             Do not wear lotions, powders, perfumes/cologne, or deodorant              Men may shave face and neck.   Do not bring valuables to the hospital. [facility] IS NOT             RESPONSIBLE   FOR VALUABLES.   Contacts, glasses, dentures or bridgework may not be worn into surgery.   Bring small overnight bag day of surgery.   DO NOT BRING YOUR HOME MEDICATIONS TO THE HOSPITAL. PHARMACY WILL DISPENSE MEDICATIONS LISTED ON YOUR MEDICATION LIST TO YOU DURING YOUR ADMISSION IN THE HOSPITAL!    Patients discharged on the day of surgery will not be allowed to drive home.  Someone NEEDS to stay with you for the first 24 hours after anesthesia.   Special Instructions: Bring a copy of your healthcare power of attorney and living will documents the day of surgery if you haven't scanned them before.               Please read over the following fact sheets you were given: IF YOU HAVE QUESTIONS ABOUT YOUR PRE-OP INSTRUCTIONS PLEASE CALL [phone]   If you received a COVID test during your pre-op visit  it is requested that you wear a mask when out in public, stay away from anyone that may not be feeling well and notify your surgeon if you develop symptoms. If you test positive for Covid or have been in contact with anyone that has tested positive in the last 10 days please notify you surgeon.    [facility] - Preparing for Surgery Before surgery, you can play an important role.  Because skin is not sterile, your skin needs to be as free of germs as possible.  You can reduce the number of germs on your skin by washing with CHG (chlorahexidine gluconate) soap before surgery.  CHG is an antiseptic cleaner which kills germs and bonds with the skin to continue killing germs even after washing. Please DO NOT use if you have an allergy to CHG or antibacterial soaps.  If your skin becomes reddened/irritated stop using the CHG and inform your nurse when you arrive at Short Stay. Do not shave (including legs and underarms) for at  least 48 hours prior to the first CHG shower.  You may shave your face/neck.  Please follow these instructions carefully:  1.  Shower with CHG Soap the night before surgery and the  morning of surgery.  2.  If you choose to wash your hair, wash your hair first as usual with your normal  shampoo.  3.  After you shampoo, rinse your hair and body thoroughly to remove the shampoo.                             4.  Use CHG as you would any other liquid soap.  You can apply chg directly to the skin and wash.  Gently with a scrungie or clean washcloth.  5.  Apply the CHG Soap to your body ONLY FROM THE NECK DOWN.   Do   not use on face/ open                           Wound or open sores. Avoid contact with eyes, ears mouth and   genitals (private parts).                       Wash face,   Genitals (private parts) with your normal soap.             6.  Wash thoroughly, paying special attention to the area where your    surgery  will be performed.  7.  Thoroughly rinse your body with warm water from the neck down.  8.  DO NOT shower/wash with your normal soap after using and rinsing off the CHG Soap.                9.  Pat yourself dry with a clean towel.            10.  Wear clean pajamas.            11.  Place clean sheets on your bed the night of your first shower and do not  sleep with pets. Day of Surgery : Do not apply any lotions/deodorants the morning of surgery.  Please wear clean clothes to the hospital/surgery center.  FAILURE TO FOLLOW THESE INSTRUCTIONS MAY RESULT IN THE CANCELLATION OF YOUR SURGERY  PATIENT SIGNATURE_________________________________  NURSE SIGNATURE__________________________________  ________________________________________________________________________

## 2023-04-30 NOTE — Telephone Encounter (Signed)
Inbound call from [place] with [facility] [place] [facility] requesting a call back at [phone]

## 2023-05-01 ENCOUNTER — Ambulatory Visit: Payer: Self-pay | Admitting: General Surgery

## 2023-05-01 ENCOUNTER — Encounter (HOSPITAL_COMMUNITY)
Admission: RE | Admit: 2023-05-01 | Discharge: 2023-05-01 | Disposition: A | Discharge status: Home / Self Care | Payer: Medicare Other | Source: Ambulatory Visit | Attending: Gastroenterology | Admitting: Gastroenterology

## 2023-05-01 ENCOUNTER — Encounter (HOSPITAL_COMMUNITY): Payer: Self-pay

## 2023-05-01 DIAGNOSIS — Z01818 Encounter for other preprocedural examination: Secondary | ICD-10-CM

## 2023-05-01 DIAGNOSIS — E1140 Type 2 diabetes mellitus with diabetic neuropathy, unspecified: Secondary | ICD-10-CM

## 2023-05-01 NOTE — Progress Notes (Signed)
.  pat

## 2023-05-03 ENCOUNTER — Other Ambulatory Visit: Payer: Self-pay

## 2023-05-03 DIAGNOSIS — Z860100 Personal history of colon polyps, unspecified: Secondary | ICD-10-CM

## 2023-05-03 DIAGNOSIS — K639 Disease of intestine, unspecified: Secondary | ICD-10-CM

## 2023-05-09 ENCOUNTER — Encounter (HOSPITAL_COMMUNITY): Payer: Self-pay

## 2023-05-09 ENCOUNTER — Other Ambulatory Visit: Payer: Self-pay

## 2023-05-09 ENCOUNTER — Encounter (HOSPITAL_COMMUNITY)
Admission: RE | Admit: 2023-05-09 | Discharge: 2023-05-09 | Disposition: A | Discharge status: Home / Self Care | Payer: Medicare Other | Source: Ambulatory Visit | Attending: General Surgery | Admitting: General Surgery

## 2023-05-09 VITALS — BP 131/82 | HR 82 | Temp 99.0°F | Resp 16 | Ht 70.0 in | Wt 257.0 lb

## 2023-05-09 DIAGNOSIS — Z01812 Encounter for preprocedural laboratory examination: Secondary | ICD-10-CM | POA: Insufficient documentation

## 2023-05-09 DIAGNOSIS — E1140 Type 2 diabetes mellitus with diabetic neuropathy, unspecified: Secondary | ICD-10-CM | POA: Diagnosis not present

## 2023-05-09 DIAGNOSIS — Z01818 Encounter for other preprocedural examination: Secondary | ICD-10-CM

## 2023-05-09 LAB — CBC
HCT: 42.1 % (ref 39.0–52.0)
Hemoglobin: 13.6 g/dL (ref 13.0–17.0)
MCH: 29.2 pg (ref 26.0–34.0)
MCHC: 32.3 g/dL (ref 30.0–36.0)
MCV: 90.5 fL (ref 80.0–100.0)
Platelets: 192 10*3/uL (ref 150–400)
RBC: 4.65 MIL/uL (ref 4.22–5.81)
RDW: 12.8 % (ref 11.5–15.5)
WBC: 8.9 10*3/uL (ref 4.0–10.5)
nRBC: 0 % (ref 0.0–0.2)

## 2023-05-09 LAB — BASIC METABOLIC PANEL
Anion gap: 8 (ref 5–15)
BUN: 17 mg/dL (ref 8–23)
CO2: 27 mmol/L (ref 22–32)
Calcium: 9.2 mg/dL (ref 8.9–10.3)
Chloride: 106 mmol/L (ref 98–111)
Creatinine, Ser: 0.71 mg/dL (ref 0.61–1.24)
GFR, Estimated: >60 mL/min (ref >60)
Glucose, Bld: 119 mg/dL — ABNORMAL HIGH (ref 70–99)
Potassium: 3.8 mmol/L (ref 3.5–5.1)
Sodium: 141 mmol/L (ref 135–145)

## 2023-05-09 LAB — HEMOGLOBIN A1C
Hgb A1c MFr Bld: 6.5 % — ABNORMAL HIGH (ref 4.8–5.6)
Mean Plasma Glucose: 139.85 mg/dL

## 2023-05-09 LAB — TYPE AND SCREEN
ABO/RH(D): O POS
Antibody Screen: NEGATIVE

## 2023-05-09 LAB — GLUCOSE, CAPILLARY: Glucose-Capillary: 161 mg/dL — ABNORMAL HIGH (ref 70–99)

## 2023-05-09 NOTE — Patient Instructions (Addendum)
SURGICAL WAITING ROOM VISITATION  Patients having surgery or a procedure may have no more than 2 support people in the waiting area - these visitors may rotate.    Children under the age of 46 must have an adult with them who is not the patient.  Due to an increase in RSV and influenza rates and associated hospitalizations, children ages 18 and under may not visit patients in [facility] hospitals.  If the patient needs to stay at the hospital during part of their recovery, the visitor guidelines for inpatient rooms apply. Pre-op nurse will coordinate an appropriate time for 1 support person to accompany patient in pre-op.  This support person may not rotate.    Please refer to the [facility] website for the visitor guidelines for Inpatients (after your surgery is over and you are in a regular room).    Your procedure is scheduled on: 05/21/23   Report to [facility] Main Entrance    Report to admitting at 9:45 AM   Call this number if you have problems the morning of surgery [phone]   Follow a clear liquid diet the day before surgery.   You may have the following liquids until  9:00 AM DAY OF SURGERY  Water Non-Citrus Juices (without pulp, NO RED-Apple, White grape, White cranberry) Black Coffee (NO MILK/CREAM OR CREAMERS, sugar ok)  Clear Tea (NO MILK/CREAM OR CREAMERS, sugar ok) regular and decaf                             Plain Jell-O (NO RED)                                           Fruit ices (not with fruit pulp, NO RED)                                     Popsicles (NO RED)                                                               Sports drinks like Gatorade (NO RED)              Drink 2 G2 drinks AT 10:00 PM the night before surgery.        The day of surgery:  Drink ONE (1) Pre-Surgery G2 at 9:00 AM the morning of surgery. Drink in one sitting. Do not sip.  This drink was given to you during your hospital  pre-op appointment visit. Nothing else  to drink after completing the  Pre-Surgery G2.          If you have questions, please contact your surgeon's office.   FOLLOW BOWEL PREP AND ANY ADDITIONAL PRE OP INSTRUCTIONS YOU RECEIVED FROM YOUR SURGEON'S OFFICE!!!     Oral Hygiene is also important to reduce your risk of infection.                                    Remember -  BRUSH YOUR TEETH THE MORNING OF SURGERY WITH YOUR REGULAR TOOTHPASTE  DENTURES WILL BE REMOVED PRIOR TO SURGERY PLEASE DO NOT APPLY "Poly grip" OR ADHESIVES!!!   Stop all vitamins and herbal supplements 7 days before surgery.   Take these medicines the morning of surgery with A SIP OF WATER: Tylenol, Albuterol, Amlodipine, Atorvastatin, Flonase, Claritin, Omeprazole   DO NOT TAKE ANY ORAL DIABETIC MEDICATIONS DAY OF YOUR SURGERY  How to Manage Your Diabetes Before and After Surgery  Why is it important to control my blood sugar before and after surgery? Improving blood sugar levels before and after surgery helps healing and can limit problems. A way of improving blood sugar control is eating a healthy diet by:  Eating less sugar and carbohydrates  Increasing activity/exercise  Talking with your doctor about reaching your blood sugar goals High blood sugars (greater than 180 mg/dL) can raise your risk of infections and slow your recovery, so you will need to focus on controlling your diabetes during the weeks before surgery. Make sure that the doctor who takes care of your diabetes knows about your planned surgery including the date and location.  How do I manage my blood sugar before surgery? Check your blood sugar at least 4 times a day, starting 2 days before surgery, to make sure that the level is not too high or low. Check your blood sugar the morning of your surgery when you wake up and every 2 hours until you get to the Short Stay unit. If your blood sugar is less than 70 mg/dL, you will need to treat for low blood sugar: Do not take  insulin. Treat a low blood sugar (less than 70 mg/dL) with  cup of clear juice (cranberry or apple), 4 glucose tablets, OR glucose gel. Recheck blood sugar in 15 minutes after treatment (to make sure it is greater than 70 mg/dL). If your blood sugar is not greater than 70 mg/dL on recheck, call [phone] for further instructions. Report your blood sugar to the short stay nurse when you get to Short Stay.  If you are admitted to the hospital after surgery: Your blood sugar will be checked by the staff and you will probably be given insulin after surgery (instead of oral diabetes medicines) to make sure you have good blood sugar levels. The goal for blood sugar control after surgery is 80-180 mg/dL.  Reviewed and Endorsed by [facility] Patient Education Committee, August 2015                              You may not have any metal on your body including jewelry, and body piercing             Do not wear lotions, powders, cologne, or deodorant              Men may shave face and neck.   Do not bring valuables to the hospital. [facility] IS NOT             RESPONSIBLE   FOR VALUABLES.   Contacts, glasses, dentures or bridgework may not be worn into surgery.   Bring small overnight bag day of surgery.   DO NOT BRING YOUR HOME MEDICATIONS TO THE HOSPITAL. PHARMACY WILL DISPENSE MEDICATIONS LISTED ON YOUR MEDICATION LIST TO YOU DURING YOUR ADMISSION IN THE HOSPITAL!              Please read over the following  fact sheets you were given: IF YOU HAVE QUESTIONS ABOUT YOUR PRE-OP INSTRUCTIONS PLEASE CALL [phone] Contras    If you received a COVID test during your pre-op visit  it is requested that you wear a mask when out in public, stay away from anyone that may not be feeling well and notify your surgeon if you develop symptoms. If you test positive for Covid or have been in contact with anyone that has tested positive in the last 10 days please notify you surgeon.    [facility] -  Preparing for Surgery Before surgery, you can play an important role.  Because skin is not sterile, your skin needs to be as free of germs as possible.  You can reduce the number of germs on your skin by washing with CHG (chlorahexidine gluconate) soap before surgery.  CHG is an antiseptic cleaner which kills germs and bonds with the skin to continue killing germs even after washing. Please DO NOT use if you have an allergy to CHG or antibacterial soaps.  If your skin becomes reddened/irritated stop using the CHG and inform your nurse when you arrive at Short Stay. Do not shave (including legs and underarms) for at least 48 hours prior to the first CHG shower.  You may shave your face/neck.  Please follow these instructions carefully:  1.  Shower with CHG Soap the night before surgery and the  morning of surgery.  2.  If you choose to wash your hair, wash your hair first as usual with your normal  shampoo.  3.  After you shampoo, rinse your hair and body thoroughly to remove the shampoo.                             4.  Use CHG as you would any other liquid soap.  You can apply chg directly to the skin and wash.  Gently with a scrungie or clean washcloth.  5.  Apply the CHG Soap to your body ONLY FROM THE NECK DOWN.   Do   not use on face/ open                           Wound or open sores. Avoid contact with eyes, ears mouth and   genitals (private parts).                       Wash face,  Genitals (private parts) with your normal soap.             6.  Wash thoroughly, paying special attention to the area where your    surgery  will be performed.  7.  Thoroughly rinse your body with warm water from the neck down.  8.  DO NOT shower/wash with your normal soap after using and rinsing off the CHG Soap.                9.  Pat yourself dry with a clean towel.            10.  Wear clean pajamas.            11.  Place clean sheets on your bed the night of your first shower and do not  sleep with  pets. Day of Surgery : Do not apply any lotions/deodorants the morning of surgery.  Please wear clean clothes to the hospital/surgery center.  FAILURE TO  FOLLOW THESE INSTRUCTIONS MAY RESULT IN THE CANCELLATION OF YOUR SURGERY  PATIENT SIGNATURE_________________________________  NURSE SIGNATURE__________________________________  ________________________________________________________________________  Travis Blanchard  An incentive spirometer is a tool that can help keep your lungs clear and active. This tool measures how well you are filling your lungs with each breath. Taking long deep breaths may help reverse or decrease the chance of developing breathing (pulmonary) problems (especially infection) following: A long period of time when you are unable to move or be active. BEFORE THE PROCEDURE  If the spirometer includes an indicator to show your best effort, your nurse or respiratory therapist will set it to a desired goal. If possible, sit up straight or lean slightly forward. Try not to slouch. Hold the incentive spirometer in an upright position. INSTRUCTIONS FOR USE  Sit on the edge of your bed if possible, or sit up as far as you can in bed or on a chair. Hold the incentive spirometer in an upright position. Breathe out normally. Place the mouthpiece in your mouth and seal your lips tightly around it. Breathe in slowly and as deeply as possible, raising the piston or the ball toward the top of the column. Hold your breath for 3-5 seconds or for as long as possible. Allow the piston or ball to fall to the bottom of the column. Remove the mouthpiece from your mouth and breathe out normally. Rest for a few seconds and repeat Steps 1 through 7 at least 10 times every 1-2 hours when you are awake. Take your time and take a few normal breaths between deep breaths. The spirometer may include an indicator to show your best effort. Use the indicator as a goal to work toward during  each repetition. After each set of 10 deep breaths, practice coughing to be sure your lungs are clear. If you have an incision (the cut made at the time of surgery), support your incision when coughing by placing a pillow or rolled up towels firmly against it. Once you are able to get out of bed, walk around indoors and cough well. You may stop using the incentive spirometer when instructed by your caregiver.  RISKS AND COMPLICATIONS Take your time so you do not get dizzy or light-headed. If you are in pain, you may need to take or ask for pain medication before doing incentive spirometry. It is harder to take a deep breath if you are having pain. AFTER USE Rest and breathe slowly and easily. It can be helpful to keep track of a log of your progress. Your caregiver can provide you with a simple table to help with this. If you are using the spirometer at home, follow these instructions: SEEK MEDICAL CARE IF:  You are having difficultly using the spirometer. You have trouble using the spirometer as often as instructed. Your pain medication is not giving enough relief while using the spirometer. You develop fever of 100.5 F (38.1 C) or higher. SEEK IMMEDIATE MEDICAL CARE IF:  You cough up bloody sputum that had not been present before. You develop fever of 102 F (38.9 C) or greater. You develop worsening pain at or near the incision site. MAKE SURE YOU:  Understand these instructions. Will watch your condition. Will get help right away if you are not doing well or get worse. Document Released: 11/05/2006 Document Revised: 09/17/2011 Document Reviewed: 01/06/2007 Exit[employer] Patient Information 2014 [employer], [place].   ________________________________________________________________________ WHAT IS A BLOOD TRANSFUSION? Blood Transfusion Information  A transfusion is the replacement of blood  or some of its parts. Blood is made up of multiple cells which provide different functions. Red  blood cells carry oxygen and are used for blood loss replacement. White blood cells fight against infection. Platelets control bleeding. Plasma helps clot blood. Other blood products are available for specialized needs, such as hemophilia or other clotting disorders. BEFORE THE TRANSFUSION  Who gives blood for transfusions?  Healthy volunteers who are fully evaluated to make sure their blood is safe. This is blood bank blood. Transfusion therapy is the safest it has ever been in the practice of medicine. Before blood is taken from a donor, a complete history is taken to make sure that person has no history of diseases nor engages in risky social behavior (examples are intravenous drug use or sexual activity with multiple partners). The donor's travel history is screened to minimize risk of transmitting infections, such as malaria. The donated blood is tested for signs of infectious diseases, such as HIV and hepatitis. The blood is then tested to be sure it is compatible with you in order to minimize the chance of a transfusion reaction. If you or a relative donates blood, this is often done in anticipation of surgery and is not appropriate for emergency situations. It takes many days to process the donated blood. RISKS AND COMPLICATIONS Although transfusion therapy is very safe and saves many lives, the main dangers of transfusion include:  Getting an infectious disease. Developing a transfusion reaction. This is an allergic reaction to something in the blood you were given. Every precaution is taken to prevent this. The decision to have a blood transfusion has been considered carefully by your caregiver before blood is given. Blood is not given unless the benefits outweigh the risks. AFTER THE TRANSFUSION Right after receiving a blood transfusion, you will usually feel much better and more energetic. This is especially true if your red blood cells have gotten low (anemic). The transfusion raises the  level of the red blood cells which carry oxygen, and this usually causes an energy increase. The nurse administering the transfusion will monitor you carefully for complications. HOME CARE INSTRUCTIONS  No special instructions are needed after a transfusion. You may find your energy is better. Speak with your caregiver about any limitations on activity for underlying diseases you may have. SEEK MEDICAL CARE IF:  Your condition is not improving after your transfusion. You develop redness or irritation at the intravenous (IV) site. SEEK IMMEDIATE MEDICAL CARE IF:  Any of the following symptoms occur over the next 12 hours: Shaking chills. You have a temperature by mouth above 102 F (38.9 C), not controlled by medicine. Chest, back, or muscle pain. People around you feel you are not acting correctly or are confused. Shortness of breath or difficulty breathing. Dizziness and fainting. You get a rash or develop hives. You have a decrease in urine output. Your urine turns a dark color or changes to pink, red, or brown. Any of the following symptoms occur over the next 10 days: You have a temperature by mouth above 102 F (38.9 C), not controlled by medicine. Shortness of breath. Weakness after normal activity. The white part of the eye turns yellow (jaundice). You have a decrease in the amount of urine or are urinating less often. Your urine turns a dark color or changes to pink, red, or brown. Document Released: 06/22/2000 Document Revised: 09/17/2011 Document Reviewed: 02/09/2008 [facility] Patient Information 2014 [place], [place].  _______________________________________________________________________

## 2023-05-09 NOTE — Progress Notes (Addendum)
COVID Vaccine Completed: yes  Date of COVID positive in last 90 days: no  PCP - Avon Gully, MD Cardiologist - Rollene Rotunda, MD  PET- 02/11/23 Epic Chest x-ray - n/a EKG - 08/17/22 Epic Stress Test - 10/23/21 Epic ECHO - n/a Cardiac Cath - n/a Pacemaker/ICD device last checked: n/a Spinal Cord Stimulator: n/a  Bowel Prep - clears day before, miralax, dulcolax, and antibiotics  Sleep Study - n/a CPAP -   Fasting Blood Sugar -  Checks Blood Sugar _____ times a day  Last dose of GLP1 agonist-  N/A GLP1 instructions:  N/A   Last dose of SGLT-2 inhibitors-  N/A SGLT-2 instructions: N/A   Blood Thinner Instructions:  n/a Aspirin Instructions: Last Dose:  Activity level: Can go up a flight of stairs and perform activities of daily living without stopping and without symptoms of chest pain or shortness of breath. Tries to avoid stairs, has knee issues.   Anesthesia review: needs cardiac clearance, HTN, palpitations, COPD, DM2  Patient denies shortness of breath, fever, cough and chest pain at PAT appointment  Patient verbalized understanding of instructions that were given to them at the PAT appointment. Patient was also instructed that they will need to review over the PAT instructions again at home before surgery.

## 2023-05-10 ENCOUNTER — Inpatient Hospital Stay (HOSPITAL_COMMUNITY): Admit: 2023-05-10 | Payer: Medicare Other | Admitting: General Surgery

## 2023-05-10 SURGERY — XI ROBOT ASSISTED LAPAROSCOPIC PARTIAL COLECTOMY
Anesthesia: General

## 2023-05-14 ENCOUNTER — Encounter (HOSPITAL_COMMUNITY): Payer: Self-pay

## 2023-05-14 ENCOUNTER — Ambulatory Visit (HOSPITAL_COMMUNITY): Admit: 2023-05-14 | Payer: Medicare Other | Admitting: Gastroenterology

## 2023-05-14 SURGERY — COLONOSCOPY WITH PROPOFOL
Anesthesia: Monitor Anesthesia Care

## 2023-05-14 NOTE — Progress Notes (Signed)
Anesthesia Chart Review   Case: [id number] Date/Time: 05/21/23 1145   Procedures:      LAPAROSCOPIC PARTIAL COLECTOMY     HERNIA REPAIR INCISIONAL WITH POSSIBLE MESH     COLONOSCOPY   Anesthesia type: General   Pre-op diagnosis: INCARCERATED INCISIONAL HERNIA COLONIC MASS   Location: [facility] ROOM 04 / [facility]   Surgeons: Gaynelle Adu, MD; Mansouraty, Netty Starring., MD       DISCUSSION:77 y.o. former smoker with h/o HTN, DM II, COPD, s/p gastric sleeve 2023, incarcerated incisional hernia, colonic mass scheduled for above procedure 05/21/23 with Dr. Corliss Parish and Dr. Gaynelle Adu.   Pt seen by cardioloy 08/17/2022 for preoperative evaluation prior to hip replacement. Per OV note, "Chart reviewed as part of pre-operative protocol coverage. Given past medical history and time since last visit, based on ACC/AHA guidelines, Travis Blanchard would be at acceptable risk for the planned procedure without further cardiovascular testing.    His RCRI is a class I risk, 0.4% risk of major cardiac event.  He is able to complete greater than 4 METS of physical activity."  Low risk stress test 10/24/2021.   VS: BP 131/82   Pulse 82   Temp 37.2 C (Oral)   Resp 16   Ht 5\' 10"  (1.778 m)   Wt 116.6 kg   SpO2 98%   BMI 36.88 kg/m   PROVIDERS: Benetta Spar, MD is PCP    LABS: Labs reviewed: Acceptable for surgery. (all labs ordered are listed, but only abnormal results are displayed)  Labs Reviewed  HEMOGLOBIN A1C - Abnormal; Notable for the following components:      Result Value   Hgb A1c MFr Bld 6.5 (*)    All other components within normal limits  BASIC METABOLIC PANEL - Abnormal; Notable for the following components:   Glucose, Bld 119 (*)    All other components within normal limits  GLUCOSE, CAPILLARY - Abnormal; Notable for the following components:   Glucose-Capillary 161 (*)    All other components within normal limits  CBC  TYPE AND SCREEN      IMAGES:   EKG:   CV: Myocardial Perfusion 10/24/2021   The study is normal. The study is low risk.   No ST deviation was noted.   LV perfusion is normal. There is no evidence of ischemia. There is no evidence of infarction.   Left ventricular function is normal. Nuclear stress EF: 59 %. The left ventricular ejection fraction is normal (55-65%). End diastolic cavity size is normal. End systolic cavity size is normal.   Prior study not available for comparison.   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 Andrey Campanile (general surgeon)   Ventral incisional hernia    Vitamin D deficiency    Wears glasses     Past Surgical History:  Procedure Laterality Date   COLONOSCOPY  08/01/2017   @[facility]  by dr reed   COLONOSCOPY WITH PROPOFOL N/A 07/18/2017   Procedure: COLONOSCOPY WITH PROPOFOL;  Surgeon: Corbin Ade, MD;  Location: [facility];  Service: Endoscopy;  Laterality: N/A;  8:45   ESOPHAGOGASTRODUODENOSCOPY     HEMORRHOID  SURGERY  1974   KNEE ARTHROSCOPY Right    1985;  1990   LAPAROSCOPIC GASTRIC SLEEVE RESECTION N/A 03/06/2022   Procedure: LAPAROSCOPIC SLEEVE GASTRECTOMY;  Surgeon: Gaynelle Adu, MD;  Location: [facility];  Service: General;  Laterality: N/A;   PROSTATECTOMY  2007   open   TOTAL HIP ARTHROPLASTY Right 08/21/2022   Procedure: TOTAL HIP ARTHROPLASTY ANTERIOR APPROACH;  Surgeon: Durene Romans, MD;  Location: [facility];  Service: Orthopedics;  Laterality: Right;   UPPER GI ENDOSCOPY N/A 03/06/2022   Procedure: UPPER GI ENDOSCOPY;  Surgeon: Gaynelle Adu, MD;  Location: [facility];  Service: General;  Laterality: N/A;    MEDICATIONS:  acetaminophen (TYLENOL) 650 MG CR tablet   albuterol (VENTOLIN HFA) 108 (90 Base) MCG/ACT inhaler   amLODipine (NORVASC) 5 MG tablet   atorvastatin (LIPITOR) 20 MG tablet   CALCIUM-VITAMIN D PO   Cholecalciferol (VITAMIN D3) 1000 units CAPS   fluticasone (FLONASE) 50 MCG/ACT nasal spray   ipratropium-albuterol (DUONEB) 0.5-2.5 (3) MG/3ML SOLN   loratadine (CLARITIN) 10 MG tablet   Multiple Vitamins-Minerals (BARIATRIC MULTIVITAMINS/IRON PO)   omeprazole (PRILOSEC) 20 MG capsule   polyethylene glycol (MIRALAX / GLYCOLAX) 17 g packet   psyllium (METAMUCIL) 58.6 % powder   TURMERIC PO   vitamin B-12 (CYANOCOBALAMIN) 1000 MCG tablet   zolpidem (AMBIEN) 10 MG tablet   No current facility-administered medications for this encounter.     Jodell Cipro Ward, PA-C [facility] Pre-Surgical Testing [phone]

## 2023-05-15 ENCOUNTER — Telehealth: Payer: Self-pay | Admitting: Gastroenterology

## 2023-05-15 MED ORDER — NA SULFATE-K SULFATE-MG SULF 17.5-3.13-1.6 GM/177ML PO SOLN: 1.0000 | Freq: Once | ORAL | 0 refills | Status: AC

## 2023-05-15 NOTE — Telephone Encounter (Signed)
Inbound call from patient stating that he is supposed to have a colonoscopy on 11/12 as well as a procedure with Dr. Andrey Campanile and was needing his colonoscopy prep instructions. I advised patient that it seemed that the procedure was cancel. Patient stated he did not understand why and needed to get to the bottom of the situation. Patient is requesting a call from nurse to discuss and request I send message as urgent. Please advise.

## 2023-05-15 NOTE — Telephone Encounter (Signed)
I spoke with the pt and dicussed that we spoke over the phone and via My Chart in detail regarding the instructions.  We discussed again the instructions at length.  All questions answered to the best of my ability.

## 2023-05-16 ENCOUNTER — Ambulatory Visit: Payer: Medicare Other | Admitting: Radiation Oncology

## 2023-05-21 ENCOUNTER — Encounter (HOSPITAL_COMMUNITY): Admission: RE | Disposition: A | Discharge status: Home Health | Payer: Self-pay | Source: Home / Self Care

## 2023-05-21 ENCOUNTER — Other Ambulatory Visit: Payer: Self-pay

## 2023-05-21 ENCOUNTER — Inpatient Hospital Stay (HOSPITAL_COMMUNITY): Payer: Medicare Other | Admitting: Physician Assistant

## 2023-05-21 ENCOUNTER — Inpatient Hospital Stay (HOSPITAL_COMMUNITY): Payer: Medicare Other | Admitting: Anesthesiology

## 2023-05-21 ENCOUNTER — Encounter (HOSPITAL_COMMUNITY): Payer: Self-pay | Admitting: General Surgery

## 2023-05-21 ENCOUNTER — Inpatient Hospital Stay (HOSPITAL_COMMUNITY)
Admission: RE | Admit: 2023-05-21 | Discharge: 2023-05-23 | DRG: 336 | Disposition: A | Discharge status: Home Health | Payer: Medicare Other | Attending: General Surgery | Admitting: General Surgery

## 2023-05-21 DIAGNOSIS — K660 Peritoneal adhesions (postprocedural) (postinfection): Secondary | ICD-10-CM | POA: Diagnosis present

## 2023-05-21 DIAGNOSIS — D122 Benign neoplasm of ascending colon: Secondary | ICD-10-CM | POA: Diagnosis present

## 2023-05-21 DIAGNOSIS — Z6372 Alcoholism and drug addiction in family: Secondary | ICD-10-CM | POA: Diagnosis not present

## 2023-05-21 DIAGNOSIS — Z823 Family history of stroke: Secondary | ICD-10-CM

## 2023-05-21 DIAGNOSIS — K635 Polyp of colon: Secondary | ICD-10-CM

## 2023-05-21 DIAGNOSIS — Z83438 Family history of other disorder of lipoprotein metabolism and other lipidemia: Secondary | ICD-10-CM

## 2023-05-21 DIAGNOSIS — E66812 Obesity, class 2: Secondary | ICD-10-CM | POA: Diagnosis present

## 2023-05-21 DIAGNOSIS — Z803 Family history of malignant neoplasm of breast: Secondary | ICD-10-CM | POA: Diagnosis not present

## 2023-05-21 DIAGNOSIS — Z87891 Personal history of nicotine dependence: Secondary | ICD-10-CM

## 2023-05-21 DIAGNOSIS — Z9884 Bariatric surgery status: Secondary | ICD-10-CM

## 2023-05-21 DIAGNOSIS — E1140 Type 2 diabetes mellitus with diabetic neuropathy, unspecified: Secondary | ICD-10-CM

## 2023-05-21 DIAGNOSIS — Z8719 Personal history of other diseases of the digestive system: Principal | ICD-10-CM

## 2023-05-21 DIAGNOSIS — M159 Polyosteoarthritis, unspecified: Secondary | ICD-10-CM | POA: Diagnosis present

## 2023-05-21 DIAGNOSIS — Z8619 Personal history of other infectious and parasitic diseases: Secondary | ICD-10-CM

## 2023-05-21 DIAGNOSIS — I10 Essential (primary) hypertension: Secondary | ICD-10-CM | POA: Diagnosis present

## 2023-05-21 DIAGNOSIS — Z833 Family history of diabetes mellitus: Secondary | ICD-10-CM

## 2023-05-21 DIAGNOSIS — E785 Hyperlipidemia, unspecified: Secondary | ICD-10-CM | POA: Diagnosis present

## 2023-05-21 DIAGNOSIS — Z8379 Family history of other diseases of the digestive system: Secondary | ICD-10-CM

## 2023-05-21 DIAGNOSIS — K219 Gastro-esophageal reflux disease without esophagitis: Secondary | ICD-10-CM | POA: Diagnosis present

## 2023-05-21 DIAGNOSIS — D123 Benign neoplasm of transverse colon: Secondary | ICD-10-CM | POA: Diagnosis present

## 2023-05-21 DIAGNOSIS — J4489 Other specified chronic obstructive pulmonary disease: Secondary | ICD-10-CM | POA: Diagnosis present

## 2023-05-21 DIAGNOSIS — K641 Second degree hemorrhoids: Secondary | ICD-10-CM | POA: Diagnosis present

## 2023-05-21 DIAGNOSIS — Z96641 Presence of right artificial hip joint: Secondary | ICD-10-CM | POA: Diagnosis present

## 2023-05-21 DIAGNOSIS — K639 Disease of intestine, unspecified: Secondary | ICD-10-CM

## 2023-05-21 DIAGNOSIS — Z800 Family history of malignant neoplasm of digestive organs: Secondary | ICD-10-CM

## 2023-05-21 DIAGNOSIS — C186 Malignant neoplasm of descending colon: Secondary | ICD-10-CM | POA: Diagnosis present

## 2023-05-21 DIAGNOSIS — Z6836 Body mass index (BMI) 36.0-36.9, adult: Secondary | ICD-10-CM | POA: Diagnosis not present

## 2023-05-21 DIAGNOSIS — Z79899 Other long term (current) drug therapy: Secondary | ICD-10-CM

## 2023-05-21 DIAGNOSIS — Z860100 Personal history of colon polyps, unspecified: Secondary | ICD-10-CM

## 2023-05-21 DIAGNOSIS — Z818 Family history of other mental and behavioral disorders: Secondary | ICD-10-CM

## 2023-05-21 DIAGNOSIS — D124 Benign neoplasm of descending colon: Secondary | ICD-10-CM

## 2023-05-21 DIAGNOSIS — J45909 Unspecified asthma, uncomplicated: Secondary | ICD-10-CM | POA: Diagnosis not present

## 2023-05-21 DIAGNOSIS — Z888 Allergy status to other drugs, medicaments and biological substances status: Secondary | ICD-10-CM

## 2023-05-21 DIAGNOSIS — R933 Abnormal findings on diagnostic imaging of other parts of digestive tract: Secondary | ICD-10-CM | POA: Diagnosis not present

## 2023-05-21 DIAGNOSIS — Z9889 Other specified postprocedural states: Secondary | ICD-10-CM | POA: Diagnosis not present

## 2023-05-21 DIAGNOSIS — J449 Chronic obstructive pulmonary disease, unspecified: Secondary | ICD-10-CM | POA: Diagnosis not present

## 2023-05-21 DIAGNOSIS — Z8349 Family history of other endocrine, nutritional and metabolic diseases: Secondary | ICD-10-CM

## 2023-05-21 DIAGNOSIS — Z8546 Personal history of malignant neoplasm of prostate: Secondary | ICD-10-CM

## 2023-05-21 DIAGNOSIS — Z7984 Long term (current) use of oral hypoglycemic drugs: Secondary | ICD-10-CM

## 2023-05-21 DIAGNOSIS — K430 Incisional hernia with obstruction, without gangrene: Secondary | ICD-10-CM | POA: Diagnosis present

## 2023-05-21 DIAGNOSIS — E119 Type 2 diabetes mellitus without complications: Secondary | ICD-10-CM | POA: Diagnosis present

## 2023-05-21 DIAGNOSIS — Z811 Family history of alcohol abuse and dependence: Secondary | ICD-10-CM

## 2023-05-21 DIAGNOSIS — Z87892 Personal history of anaphylaxis: Secondary | ICD-10-CM

## 2023-05-21 DIAGNOSIS — D120 Benign neoplasm of cecum: Secondary | ICD-10-CM | POA: Diagnosis present

## 2023-05-21 DIAGNOSIS — Z860101 Personal history of adenomatous and serrated colon polyps: Secondary | ICD-10-CM

## 2023-05-21 DIAGNOSIS — Z8249 Family history of ischemic heart disease and other diseases of the circulatory system: Secondary | ICD-10-CM

## 2023-05-21 DIAGNOSIS — Z880 Allergy status to penicillin: Secondary | ICD-10-CM

## 2023-05-21 DIAGNOSIS — Z9079 Acquired absence of other genital organ(s): Secondary | ICD-10-CM

## 2023-05-21 HISTORY — DX: Polyp of colon: K63.5

## 2023-05-21 HISTORY — PX: LAPAROSCOPIC PARTIAL COLECTOMY: SHX5907

## 2023-05-21 HISTORY — PX: ENDOSCOPIC MUCOSAL RESECTION: SHX6839

## 2023-05-21 HISTORY — DX: Benign neoplasm of descending colon: D12.4

## 2023-05-21 HISTORY — PX: SUBMUCOSAL TATTOO INJECTION: SHX6856

## 2023-05-21 HISTORY — PX: COLONOSCOPY: SHX5424

## 2023-05-21 HISTORY — PX: SUBMUCOSAL LIFTING INJECTION: SHX6855

## 2023-05-21 HISTORY — PX: HEMOSTASIS CLIP PLACEMENT: SHX6857

## 2023-05-21 HISTORY — PX: POLYPECTOMY: SHX5525

## 2023-05-21 HISTORY — PX: HEMOSTASIS CONTROL: SHX6838

## 2023-05-21 LAB — GLUCOSE, CAPILLARY
Glucose-Capillary: 118 mg/dL — ABNORMAL HIGH (ref 70–99)
Glucose-Capillary: 160 mg/dL — ABNORMAL HIGH (ref 70–99)

## 2023-05-21 SURGERY — COLONOSCOPY
Anesthesia: Monitor Anesthesia Care

## 2023-05-21 SURGERY — LAPAROSCOPIC PARTIAL COLECTOMY
Anesthesia: General

## 2023-05-21 MED ORDER — SPOT INK MARKER SYRINGE KIT
PACK | SUBMUCOSAL | Status: AC
Start: 2023-05-21 — End: ?
  Filled 2023-05-21: qty 5

## 2023-05-21 MED ORDER — HYDROMORPHONE HCL 1 MG/ML IJ SOLN: 0.2500 mg | INTRAMUSCULAR | Status: DC | PRN

## 2023-05-21 MED ORDER — SIMETHICONE 80 MG PO CHEW: 40.0000 mg | CHEWABLE_TABLET | Freq: Four times a day (QID) | ORAL | Status: DC | PRN

## 2023-05-21 MED ORDER — BUPIVACAINE-EPINEPHRINE 0.25% -1:200000 IJ SOLN
INTRAMUSCULAR | Status: AC
  Filled 2023-05-21: qty 1

## 2023-05-21 MED ORDER — SPOT INK MARKER SYRINGE KIT
PACK | SUBMUCOSAL | Status: DC | PRN
  Administered 2023-05-21: 3.5 mL via SUBMUCOSAL

## 2023-05-21 MED ORDER — SUGAMMADEX SODIUM 200 MG/2ML IV SOLN
INTRAVENOUS | Status: DC | PRN
  Administered 2023-05-21: 400 mg via INTRAVENOUS

## 2023-05-21 MED ORDER — SODIUM CHLORIDE 0.9 % IR SOLN
Status: DC | PRN
  Administered 2023-05-21: 1000 mL

## 2023-05-21 MED ORDER — DEXAMETHASONE SODIUM PHOSPHATE 10 MG/ML IJ SOLN
INTRAMUSCULAR | Status: DC | PRN
  Administered 2023-05-21: 4 mg via INTRAVENOUS

## 2023-05-21 MED ORDER — DIPHENHYDRAMINE HCL 12.5 MG/5ML PO ELIX: 12.5000 mg | ORAL_SOLUTION | Freq: Four times a day (QID) | ORAL | Status: DC | PRN

## 2023-05-21 MED ORDER — ENOXAPARIN SODIUM 40 MG/0.4ML IJ SOSY
40.0000 mg | PREFILLED_SYRINGE | INTRAMUSCULAR | Status: DC
  Administered 2023-05-22 – 2023-05-23 (×2): 40 mg via SUBCUTANEOUS
  Filled 2023-05-21 (×2): qty 0.4

## 2023-05-21 MED ORDER — ZOLPIDEM TARTRATE 5 MG PO TABS
10.0000 mg | ORAL_TABLET | Freq: Every evening | ORAL | Status: DC | PRN
  Administered 2023-05-22 (×2): 10 mg via ORAL
  Filled 2023-05-21 (×2): qty 2

## 2023-05-21 MED ORDER — ALVIMOPAN 12 MG PO CAPS
12.0000 mg | ORAL_CAPSULE | ORAL | Status: AC
Start: 2023-05-21 — End: 2023-05-21
  Administered 2023-05-21: 12 mg via ORAL
  Filled 2023-05-21: qty 1

## 2023-05-21 MED ORDER — KETAMINE HCL 10 MG/ML IJ SOLN
INTRAMUSCULAR | Status: DC | PRN
  Administered 2023-05-21: 35 mg via INTRAVENOUS

## 2023-05-21 MED ORDER — HEPARIN SODIUM (PORCINE) 5000 UNIT/ML IJ SOLN
5000.0000 [IU] | Freq: Once | INTRAMUSCULAR | Status: AC
  Administered 2023-05-21: 5000 [IU] via SUBCUTANEOUS
  Filled 2023-05-21: qty 1

## 2023-05-21 MED ORDER — ALBUTEROL SULFATE HFA 108 (90 BASE) MCG/ACT IN AERS: 1.0000 | INHALATION_SPRAY | Freq: Four times a day (QID) | RESPIRATORY_TRACT | Status: DC | PRN

## 2023-05-21 MED ORDER — ONDANSETRON HCL 4 MG/2ML IJ SOLN
INTRAMUSCULAR | Status: AC
  Filled 2023-05-21: qty 2

## 2023-05-21 MED ORDER — ROCURONIUM BROMIDE 10 MG/ML (PF) SYRINGE
PREFILLED_SYRINGE | INTRAVENOUS | Status: AC
  Filled 2023-05-21: qty 10

## 2023-05-21 MED ORDER — ORAL CARE MOUTH RINSE: 15.0000 mL | Freq: Once | OROMUCOSAL | Status: AC

## 2023-05-21 MED ORDER — DOCUSATE SODIUM 100 MG PO CAPS
100.0000 mg | ORAL_CAPSULE | Freq: Two times a day (BID) | ORAL | Status: DC
  Administered 2023-05-21 – 2023-05-23 (×4): 100 mg via ORAL
  Filled 2023-05-21 (×4): qty 1

## 2023-05-21 MED ORDER — ONDANSETRON 4 MG PO TBDP: 4.0000 mg | ORAL_TABLET | Freq: Four times a day (QID) | ORAL | Status: DC | PRN

## 2023-05-21 MED ORDER — ACETAMINOPHEN 500 MG PO TABS: 1000.0000 mg | ORAL_TABLET | ORAL | Status: DC

## 2023-05-21 MED ORDER — ROCURONIUM BROMIDE 10 MG/ML (PF) SYRINGE
PREFILLED_SYRINGE | INTRAVENOUS | Status: DC | PRN
  Administered 2023-05-21: 20 mg via INTRAVENOUS
  Administered 2023-05-21: 100 mg via INTRAVENOUS
  Administered 2023-05-21: 20 mg via INTRAVENOUS

## 2023-05-21 MED ORDER — KETAMINE HCL 50 MG/5ML IJ SOSY
PREFILLED_SYRINGE | INTRAMUSCULAR | Status: AC
  Filled 2023-05-21: qty 5

## 2023-05-21 MED ORDER — GLYCOPYRROLATE 0.2 MG/ML IJ SOLN
INTRAMUSCULAR | Status: AC
  Filled 2023-05-21: qty 1

## 2023-05-21 MED ORDER — LIDOCAINE HCL 2 % IJ SOLN
INTRAMUSCULAR | Status: AC
  Filled 2023-05-21: qty 20

## 2023-05-21 MED ORDER — OXYCODONE HCL 5 MG/5ML PO SOLN: 5.0000 mg | Freq: Once | ORAL | Status: DC | PRN

## 2023-05-21 MED ORDER — BUPIVACAINE HCL (PF) 0.25 % IJ SOLN
INTRAMUSCULAR | Status: AC
  Filled 2023-05-21: qty 30

## 2023-05-21 MED ORDER — OXYCODONE HCL 5 MG PO TABS: 5.0000 mg | ORAL_TABLET | ORAL | Status: DC | PRN

## 2023-05-21 MED ORDER — LIDOCAINE 2% (20 MG/ML) 5 ML SYRINGE
INTRAMUSCULAR | Status: DC | PRN
  Administered 2023-05-21: 80 mg via INTRAVENOUS

## 2023-05-21 MED ORDER — LIDOCAINE HCL (PF) 2 % IJ SOLN
INTRAMUSCULAR | Status: AC
  Filled 2023-05-21: qty 5

## 2023-05-21 MED ORDER — KCL IN DEXTROSE-NACL 20-5-0.45 MEQ/L-%-% IV SOLN
INTRAVENOUS | Status: AC
  Filled 2023-05-21 (×2): qty 1000

## 2023-05-21 MED ORDER — SODIUM CHLORIDE 0.9 % IV SOLN
1.0000 g | INTRAVENOUS | Status: AC
  Administered 2023-05-21: 1 g via INTRAVENOUS
  Filled 2023-05-21: qty 1000

## 2023-05-21 MED ORDER — ONDANSETRON HCL 4 MG/2ML IJ SOLN: 4.0000 mg | Freq: Four times a day (QID) | INTRAMUSCULAR | Status: DC | PRN

## 2023-05-21 MED ORDER — BUPIVACAINE LIPOSOME 1.3 % IJ SUSP
INTRAMUSCULAR | Status: AC
  Filled 2023-05-21: qty 20

## 2023-05-21 MED ORDER — SODIUM CHLORIDE 0.9 % IV SOLN: INTRAVENOUS | Status: DC

## 2023-05-21 MED ORDER — ACETAMINOPHEN 500 MG PO TABS
1000.0000 mg | ORAL_TABLET | Freq: Four times a day (QID) | ORAL | Status: DC
  Administered 2023-05-21 – 2023-05-23 (×8): 1000 mg via ORAL
  Filled 2023-05-21 (×8): qty 2

## 2023-05-21 MED ORDER — OXYCODONE HCL 5 MG PO TABS: 5.0000 mg | ORAL_TABLET | Freq: Once | ORAL | Status: DC | PRN

## 2023-05-21 MED ORDER — ALBUTEROL SULFATE (2.5 MG/3ML) 0.083% IN NEBU: 2.5000 mg | INHALATION_SOLUTION | Freq: Four times a day (QID) | RESPIRATORY_TRACT | Status: DC | PRN

## 2023-05-21 MED ORDER — INSULIN ASPART 100 UNIT/ML IJ SOLN: 0.0000 [IU] | INTRAMUSCULAR | Status: DC | PRN

## 2023-05-21 MED ORDER — PROPOFOL 10 MG/ML IV BOLUS
INTRAVENOUS | Status: DC | PRN
  Administered 2023-05-21: 150 mg via INTRAVENOUS

## 2023-05-21 MED ORDER — LACTATED RINGERS IV SOLN: INTRAVENOUS | Status: DC

## 2023-05-21 MED ORDER — GABAPENTIN 100 MG PO CAPS
100.0000 mg | ORAL_CAPSULE | ORAL | Status: AC
  Administered 2023-05-21: 100 mg via ORAL
  Filled 2023-05-21: qty 1

## 2023-05-21 MED ORDER — AMLODIPINE BESYLATE 5 MG PO TABS
5.0000 mg | ORAL_TABLET | Freq: Every day | ORAL | Status: DC
Start: 2023-05-22 — End: 2023-05-23
  Administered 2023-05-22 – 2023-05-23 (×2): 5 mg via ORAL
  Filled 2023-05-21 (×2): qty 1

## 2023-05-21 MED ORDER — LIDOCAINE 20MG/ML (2%) 15 ML SYRINGE OPTIME
INTRAMUSCULAR | Status: DC | PRN
  Administered 2023-05-21: 1.5 mg/kg/h via INTRAVENOUS

## 2023-05-21 MED ORDER — AMISULPRIDE (ANTIEMETIC) 5 MG/2ML IV SOLN: 10.0000 mg | Freq: Once | INTRAVENOUS | Status: DC | PRN

## 2023-05-21 MED ORDER — SODIUM CHLORIDE 0.9 % IV SOLN: 12.5000 mg | INTRAVENOUS | Status: DC | PRN

## 2023-05-21 MED ORDER — BUPIVACAINE LIPOSOME 1.3 % IJ SUSP
INTRAMUSCULAR | Status: DC | PRN
  Administered 2023-05-21: 50 mL

## 2023-05-21 MED ORDER — PANTOPRAZOLE SODIUM 40 MG IV SOLR
40.0000 mg | Freq: Every day | INTRAVENOUS | Status: DC
  Administered 2023-05-21 – 2023-05-22 (×2): 40 mg via INTRAVENOUS
  Filled 2023-05-21 (×2): qty 10

## 2023-05-21 MED ORDER — DIPHENHYDRAMINE HCL 50 MG/ML IJ SOLN: 12.5000 mg | Freq: Four times a day (QID) | INTRAMUSCULAR | Status: DC | PRN

## 2023-05-21 MED ORDER — FENTANYL CITRATE (PF) 100 MCG/2ML IJ SOLN
INTRAMUSCULAR | Status: AC
  Filled 2023-05-21: qty 2

## 2023-05-21 MED ORDER — FENTANYL CITRATE (PF) 250 MCG/5ML IJ SOLN
INTRAMUSCULAR | Status: AC
  Filled 2023-05-21: qty 5

## 2023-05-21 MED ORDER — ONDANSETRON HCL 4 MG/2ML IJ SOLN
INTRAMUSCULAR | Status: DC | PRN
  Administered 2023-05-21: 4 mg via INTRAVENOUS

## 2023-05-21 MED ORDER — INFLUENZA VAC A&B SURF ANT ADJ 0.5 ML IM SUSY
0.5000 mL | PREFILLED_SYRINGE | INTRAMUSCULAR | Status: DC
  Filled 2023-05-21: qty 0.5

## 2023-05-21 MED ORDER — CHLORHEXIDINE GLUCONATE 0.12 % MT SOLN
15.0000 mL | Freq: Once | OROMUCOSAL | Status: AC
Start: 2023-05-21 — End: 2023-05-21
  Administered 2023-05-21: 15 mL via OROMUCOSAL

## 2023-05-21 MED ORDER — METHOCARBAMOL 500 MG PO TABS
500.0000 mg | ORAL_TABLET | Freq: Four times a day (QID) | ORAL | Status: DC | PRN
  Administered 2023-05-21: 500 mg via ORAL
  Filled 2023-05-21: qty 1

## 2023-05-21 MED ORDER — PROPOFOL 10 MG/ML IV BOLUS
INTRAVENOUS | Status: AC
  Filled 2023-05-21: qty 20

## 2023-05-21 MED ORDER — FENTANYL CITRATE (PF) 250 MCG/5ML IJ SOLN
INTRAMUSCULAR | Status: DC | PRN
  Administered 2023-05-21 (×2): 50 ug via INTRAVENOUS
  Administered 2023-05-21: 100 ug via INTRAVENOUS
  Administered 2023-05-21 (×3): 50 ug via INTRAVENOUS

## 2023-05-21 MED ORDER — MORPHINE SULFATE (PF) 2 MG/ML IV SOLN: 1.0000 mg | INTRAVENOUS | Status: DC | PRN

## 2023-05-21 SURGICAL SUPPLY — 90 items
APL PRP STRL LF DISP 70% ISPRP (MISCELLANEOUS) ×2
APL SKNCLS STERI-STRIP NONHPOA (GAUZE/BANDAGES/DRESSINGS)
APPLIER CLIP 5 13 M/L LIGAMAX5 (MISCELLANEOUS)
APPLIER CLIP ROT 10 11.4 M/L (STAPLE)
APR CLP MED LRG 11.4X10 (STAPLE)
APR CLP MED LRG 5 ANG JAW (MISCELLANEOUS)
BAG COUNTER SPONGE SURGICOUNT (BAG) IMPLANT
BAG SPNG CNTER NS LX DISP (BAG)
BENZOIN TINCTURE PRP APPL 2/3 (GAUZE/BANDAGES/DRESSINGS) IMPLANT
BINDER ABDOMINAL 12 ML 46-62 (SOFTGOODS) IMPLANT
BLADE EXTENDED COATED 6.5IN (ELECTRODE) IMPLANT
BRR ADH 5X3 SEPRAFILM 6 SHT (MISCELLANEOUS)
BRR ADH 6X5 SEPRAFILM 1 SHT (MISCELLANEOUS)
CABLE HIGH FREQUENCY MONO STRZ (ELECTRODE) IMPLANT
CELLS DAT CNTRL 66122 CELL SVR (MISCELLANEOUS) IMPLANT
CHLORAPREP W/TINT 26 (MISCELLANEOUS) ×2 IMPLANT
CLIP APPLIE 5 13 M/L LIGAMAX5 (MISCELLANEOUS) IMPLANT
CLIP APPLIE ROT 10 11.4 M/L (STAPLE) IMPLANT
COUNTER NEEDLE 1200 MAGNETIC (NEEDLE) ×2 IMPLANT
COVER MAYO STAND STRL (DRAPES) ×6 IMPLANT
COVER SURGICAL LIGHT HANDLE (MISCELLANEOUS) ×2 IMPLANT
DRAIN CHANNEL 19F RND (DRAIN) IMPLANT
DRAPE LAPAROSCOPIC ABDOMINAL (DRAPES) ×2 IMPLANT
DRAPE LAPAROTOMY T 98X78 PEDS (DRAPES) IMPLANT
DRSG OPSITE POSTOP 4X6 (GAUZE/BANDAGES/DRESSINGS) IMPLANT
DRSG OPSITE POSTOP 4X8 (GAUZE/BANDAGES/DRESSINGS) IMPLANT
DRSG TELFA 3X8 NADH STRL (GAUZE/BANDAGES/DRESSINGS) IMPLANT
ELECT REM PT RETURN 15FT ADLT (MISCELLANEOUS) ×2 IMPLANT
EVACUATOR SILICONE 100CC (DRAIN) IMPLANT
GAUZE SPONGE 4X4 12PLY STRL (GAUZE/BANDAGES/DRESSINGS) IMPLANT
GLOVE BIO SURGEON STRL SZ7.5 (GLOVE) ×2 IMPLANT
GLOVE INDICATOR 8.0 STRL GRN (GLOVE) ×2 IMPLANT
GOWN STRL REUS W/ TWL XL LVL3 (GOWN DISPOSABLE) ×8 IMPLANT
GOWN STRL REUS W/TWL XL LVL3 (GOWN DISPOSABLE) ×8
IRRIG SUCT STRYKERFLOW 2 WTIP (MISCELLANEOUS) ×2
IRRIGATION SUCT STRKRFLW 2 WTP (MISCELLANEOUS) ×2 IMPLANT
KIT BASIN OR (CUSTOM PROCEDURE TRAY) ×2 IMPLANT
KIT TURNOVER KIT A (KITS) IMPLANT
LEGGING LITHOTOMY PAIR STRL (DRAPES) IMPLANT
NDL HYPO 22X1.5 SAFETY MO (MISCELLANEOUS) IMPLANT
NEEDLE HYPO 22X1.5 SAFETY MO (MISCELLANEOUS) IMPLANT
NS IRRIG 1000ML POUR BTL (IV SOLUTION) ×2 IMPLANT
PACK COLON (CUSTOM PROCEDURE TRAY) ×2 IMPLANT
PACK GENERAL/GYN (CUSTOM PROCEDURE TRAY) ×2 IMPLANT
PAD POSITIONING PINK XL (MISCELLANEOUS) IMPLANT
PENCIL SMOKE EVACUATOR (MISCELLANEOUS) IMPLANT
PROTECTOR NERVE ULNAR (MISCELLANEOUS) IMPLANT
RETRACTOR WND ALEXIS 18 MED (MISCELLANEOUS) IMPLANT
RTRCTR WOUND ALEXIS 18CM MED (MISCELLANEOUS)
SCISSORS LAP 5X35 DISP (ENDOMECHANICALS) IMPLANT
SEPRAFILM MEMBRANE 5X6 (MISCELLANEOUS) IMPLANT
SEPRAFILM PROCEDURAL PACK 3X5 (MISCELLANEOUS) IMPLANT
SET TUBE SMOKE EVAC HIGH FLOW (TUBING) ×2 IMPLANT
SHEARS HARMONIC 36 ACE (MISCELLANEOUS) ×2 IMPLANT
SLEEVE Z-THREAD 5X100MM (TROCAR) ×4 IMPLANT
SPIKE FLUID TRANSFER (MISCELLANEOUS) ×2 IMPLANT
STAPLER SKIN PROX WIDE 3.9 (STAPLE) ×2 IMPLANT
STRIP CLOSURE SKIN 1/2X4 (GAUZE/BANDAGES/DRESSINGS) IMPLANT
SUT ETHILON 2 0 PS N (SUTURE) IMPLANT
SUT ETHILON 3 0 PS 1 (SUTURE) IMPLANT
SUT MNCRL AB 4-0 PS2 18 (SUTURE) IMPLANT
SUT NOVA 0 T19/GS 22DT (SUTURE) IMPLANT
SUT NOVA 1 T20/GS 25DT (SUTURE) IMPLANT
SUT NOVA NAB DX-16 0-1 5-0 T12 (SUTURE) IMPLANT
SUT PDS AB 0 CTX 60 (SUTURE) IMPLANT
SUT PDS AB 1 TP1 96 (SUTURE) IMPLANT
SUT PROLENE 0 CT 1 CR/8 (SUTURE) IMPLANT
SUT SILK 2 0 (SUTURE) ×2
SUT SILK 2 0 SH CR/8 (SUTURE) ×2 IMPLANT
SUT SILK 2-0 18XBRD TIE 12 (SUTURE) ×2 IMPLANT
SUT SILK 3 0 (SUTURE) ×2
SUT SILK 3 0 SH CR/8 (SUTURE) ×2 IMPLANT
SUT SILK 3-0 18XBRD TIE 12 (SUTURE) ×2 IMPLANT
SUT VIC AB 1 CTX 18 (SUTURE) IMPLANT
SUT VIC AB 2-0 SH 27 (SUTURE)
SUT VIC AB 2-0 SH 27X BRD (SUTURE) IMPLANT
SUT VIC AB 3-0 SH 18 (SUTURE) ×2 IMPLANT
SUT VIC AB 3-0 SH 27 (SUTURE)
SUT VIC AB 3-0 SH 27X BRD (SUTURE) IMPLANT
SYR CONTROL 10ML LL (SYRINGE) IMPLANT
SYS LAPSCP GELPORT 120MM (MISCELLANEOUS)
SYSTEM LAPSCP GELPORT 120MM (MISCELLANEOUS) IMPLANT
TAPE UMBILICAL 1/8 X36 TWILL (MISCELLANEOUS) IMPLANT
TOWEL OR 17X26 10 PK STRL BLUE (TOWEL DISPOSABLE) ×2 IMPLANT
TOWEL OR NON WOVEN STRL DISP B (DISPOSABLE) ×2 IMPLANT
TRAY FOLEY MTR SLVR 14FR STAT (SET/KITS/TRAYS/PACK) IMPLANT
TRAY FOLEY MTR SLVR 16FR STAT (SET/KITS/TRAYS/PACK) IMPLANT
TROCAR 11X100 Z THREAD (TROCAR) IMPLANT
TROCAR ADV FIXATION 12X100MM (TROCAR) IMPLANT
TROCAR Z-THREAD OPTICAL 5X100M (TROCAR) ×2 IMPLANT

## 2023-05-21 NOTE — Interval H&P Note (Signed)
History and Physical Interval Note:  05/21/2023 11:30 AM  Travis Blanchard  has presented today for surgery, with the diagnosis of INCARCERATED INCISIONAL HERNIA COLONIC MASS.  The various methods of treatment have been discussed with the patient and family. After consideration of risks, benefits and other options for treatment, the patient has consented to  Procedure(s): LAPAROSCOPIC PARTIAL COLECTOMY (N/A) HERNIA REPAIR INCISIONAL WITH POSSIBLE MESH (N/A) COLONOSCOPY (N/A) as a surgical intervention.  The patient's history has been reviewed, patient examined, no change in status, stable for surgery.  I have reviewed the patient's chart and labs.  Questions were answered to the patient's satisfaction.    Patient denies medical changes since seen in the clinic a few weeks ago. Plan will be to go in laparoscopically and try to reduce his incarcerated transverse colon.  We did discuss that if we cannot reduce it laparoscopically then we have to do it through an open incision.  Once the colon is reduced then the plan will be for gastroenterology to come in and do an on table colonoscopy.  If the lesion is amendable to endoscopic resection then we will just need to fix the hernia at the end.  If the lesion is not amendable to endoscopic resection or appears grossly concerning for malignancy then we will proceed with partial colectomy and closure of the abdominal wall.  All of this was reviewed again with the patient.  Questions were asked and answered.  Gaynelle Adu

## 2023-05-21 NOTE — Transfer of Care (Signed)
Immediate Anesthesia Transfer of Care Note  Patient: Travis Blanchard  Procedure(s) Performed: LAPAROSCOPIC ASSISTED REPAIR OF INCARCERATED INCISIONAL HERNIA; LYSIS OF ADHESIONS COLONOSCOPY ENDOSCOPIC MUCOSAL RESECTION POLYPECTOMY SUBMUCOSAL LIFTING INJECTION SUBMUCOSAL TATTOO INJECTION HEMOSTASIS CLIP PLACEMENT HEMOSTASIS CONTROL  Patient Location: PACU  Anesthesia Type:General  Level of Consciousness: awake and patient cooperative  Airway & Oxygen Therapy: Patient Spontanous Breathing and Patient connected to face mask oxygen  Post-op Assessment: Report given to RN and Post -op Vital signs reviewed and stable  Post vital signs: Reviewed and stable  Last Vitals:  Vitals Value Taken Time  BP 146/77 05/21/23 1548  Temp    Pulse 83 05/21/23 1550  Resp 18 05/21/23 1550  SpO2 100 % 05/21/23 1550  Vitals shown include unfiled device data.  Last Pain:  Vitals:   05/21/23 1035  TempSrc:   PainSc: 0-No pain      Patients Stated Pain Goal: 4 (05/21/23 1035)  Complications: No notable events documented.

## 2023-05-21 NOTE — H&P (Signed)
REFERRING PHYSICIAN: Self  PROVIDER: Gaylon Bentz Sherril Cong, MD  MRN: [id number] DOB: 02/08/1946 DATE OF ENCOUNTER: 04/25/2023  Subjective  Chief Complaint: NEW PROBLEM ( Ventral hernia )   History of Present Illness: Travis Blanchard is a 77 y.o. male who is status post laparoscopic sleeve gastrectomy on 03/06/22 by Dr. Andrey Campanile. Preoperative weight was 279.8 pounds.  Comorbidities at the time of surgery included hypertension, diabetes mellitus type 2, osteoarthritis and a known incisional hernia from a prior open prostatectomy in 2007, and remote history of prostate cancer  Last seen in the office in October 2023 at which time he weighed 260 pounds  He had some testicular pain back in early summer and was found to have an elevated PSA. Went PET/CT which was concerning for a focus of increased activity inferior to the bladder just to the right of the midline in the anterior aspect of the prostatic fossa. Differential included urine within the prostatic urethra versus local recurrence. Based on his elevated PSA he was felt to have a biochemical recurrence and he was started on 7/2 weeks of external beam therapy with ADT.  He had had previous attempts at colonoscopy but were aborted due to his inability to get the colonoscope through his incisional hernia. He underwent a virtual colonoscopy which demonstrated a mass in the hepatic flexure. He is currently scheduled for colonoscopy November 5  He denies any nausea or vomiting. He denies any abdominal pain. The hernia has not been firm or hard. Daily bowel movements. No chest pain, chest pressure, shortness of breath, dyspnea on exertion.    Review of Systems: A complete review of systems was obtained from the patient. I have reviewed this information and discussed as appropriate with the patient. See HPI as well for other ROS.  ROS  Medical History: Past Medical History: Diagnosis Date Arthritis Asthma, unspecified asthma severity, unspecified  whether complicated, unspecified whether persistent (HHS-HCC) COPD (chronic obstructive pulmonary disease) (CMS/HHS-HCC) Diabetes mellitus without complication (CMS/HHS-HCC) History of cancer Hypertension  Patient Active Problem List Diagnosis Essential hypertension History of Helicobacter pylori infection Osteoarthritis Class 2 severe obesity due to excess calories with serious comorbidity in adult (CMS/HHS-HCC) Diabetes mellitus type 2 in obese (CMS/HHS-HCC)  Past Surgical History: Procedure Laterality Date prostate removal right knee orthoscopic   Allergies Allergen Reactions Lisinopril Swelling Facial swelling Penicillins Anaphylaxis, Hives, Itching, Other (See Comments) and Shortness Of Breath Other reaction(s): Other (See Comments) Has patient had a PCN reaction causing immediate rash, facial/tongue/throat swelling, SOB or lightheadedness with hypotension: Yes Has patient had a PCN reaction causing severe rash involving mucus membranes or skin necrosis: No Has patient had a PCN reaction that required hospitalization: No Has patient had a PCN reaction occurring within the last 10 years: No If all of the above answers are "NO", then may proceed with Cephalosporin use. Has patient had a PCN reaction causing immediate rash, facial/tongue/throat swelling, SOB or lightheadedness with hypotension: Yes Has patient had a PCN reaction causing severe rash involving mucus membranes or skin necrosis: No Has patient had a PCN reaction that required hospitalization: No Has patient had a PCN reaction occurring within the last 10 years: No If all of the above answers are "NO", then may proceed with Cephalosporin use.  Other reaction(s): Other (See Comments) Has patient had a PCN reaction causing immediate rash, facial/tongue/throat swelling, SOB or lightheadedness with hypotension: Yes Has patient had a PCN reaction causing severe rash involving mucus membranes or skin necrosis: No Has  patient had a PCN  reaction that required hospitalization: No Has patient had a PCN reaction occurring within the last 10 years: No If all of the above answers are "NO", then may proceed with Cephalosporin use.   Current Outpatient Medications on File Prior to Visit Medication Sig Dispense Refill acetaminophen (TYLENOL) 500 MG tablet Take 1,000 mg by mouth every 6 (six) hours as needed albuterol 90 mcg/actuation inhaler amLODIPine (NORVASC) 5 MG tablet atorvastatin (LIPITOR) 20 MG tablet Take 20 mg by mouth once daily cholecalciferol (VITAMIN D3) 1000 unit capsule Take by mouth cyanocobalamin (VITAMIN B12) 1000 MCG tablet Take by mouth fluticasone propionate (FLONASE) 50 mcg/actuation nasal spray 2 PUFFS EVERY DAY AS DIRECTED ipratropium-albuteroL (DUO-NEB) nebulizer solution Inhale into the lungs loratadine (CLARITIN) 10 mg tablet Take 10 mg by mouth once daily omeprazole (PRILOSEC) 20 MG DR capsule Take by mouth psyllium, aspartame, (METAMUCIL) 3.4 gram packet Take by mouth zolpidem (AMBIEN) 10 mg tablet Take 10 mg by mouth at bedtime metFORMIN (GLUCOPHAGE) 500 MG tablet Take 500 mg by mouth 2 (two) times daily (Patient not taking: Reported on 05/02/2022) naproxen sodium (ALEVE) 220 MG tablet Take by mouth  No current facility-administered medications on file prior to visit.  Family History Problem Relation Age of Onset Stroke Mother Obesity Mother High blood pressure (Hypertension) Mother Hyperlipidemia (Elevated cholesterol) Mother Coronary Artery Disease (Blocked arteries around heart) Mother Diabetes Father Obesity Sister High blood pressure (Hypertension) Sister Diabetes Sister Breast cancer Sister Obesity Brother High blood pressure (Hypertension) Brother   Social History  Tobacco Use Smoking Status Former Types: Cigarettes Smokeless Tobacco Never   Social History  Socioeconomic History Marital status: Married Tobacco Use Smoking status: Former Types:  Cigarettes Smokeless tobacco: Never Vaping Use Vaping status: Never Used Substance and Sexual Activity Alcohol use: Yes Drug use: Yes Types: Marijuana  Social Drivers of Health  Food Insecurity: No Food Insecurity (02/06/2023) Received from [facility] Hunger Vital Sign Worried About Running Out of Food in the Last Year: Never true Ran Out of Food in the Last Year: Never true Transportation Needs: No Transportation Needs (02/06/2023) Received from [facility] - Transportation Lack of Transportation (Medical): No Lack of Transportation (Non-Medical): No  Objective:  Vitals: 04/25/23 1322 BP: 100/70 Pulse: 94 Temp: 37.2 C (99 F) SpO2: 98% Weight: (!) 117.4 kg (258 lb 12.8 oz) Height: 177.8 cm (5\' 10" ) PainSc: 0-No pain  Body mass index is 37.13 kg/m.  Constitutional: NAD; conversant; no deformities Eyes: Moist conjunctiva; no lid lag; anicteric; PERRL Neck: Trachea midline; no thyromegaly Lungs: Normal respiratory effort; no tactile fremitus CV: RRR; no palpable thrills; no pitting edema GI: Abd soft, nontender, old lower midline incision large umbilical bulge not reducible.; no palpable hepatosplenomegaly MSK: Normal gait; no clubbing/cyanosis; right limp Psychiatric: Appropriate affect; alert and oriented x3 Lymphatic: No palpable cervical or axillary lymphadenopathy Skin: No rash, lesions or jaundice  Labs, Imaging and Diagnostic Testing:  Dr Kathrynn Running office visit note 02/06/23 My last office note; my op note CT virtual colonoscopy September10 CT ABDOMEN AND PELVIS WITHOUT CONTRAST  Lower chest: No acute findings  Hepatobiliary: No focal hepatic abnormality. Gallbladder unremarkable.  Pancreas: No focal abnormality or ductal dilatation.  Spleen: No focal abnormality. Normal size.  Adrenals/Urinary Tract: No adrenal abnormality. No focal renal abnormality. No stones or hydronephrosis. Urinary bladder is unremarkable.  Stomach/Bowel: Prior  gastric sleeve. Stomach and small bowel decompressed. Appendix normal.  Vascular/Lymphatic: Aortoiliac atherosclerosis. No evidence of aneurysm or adenopathy. No visible mesenteric lymph nodes in the area of the hepatic flexure.  Reproductive: Obscured by beam hardening artifact from right hip replacement.  Other: No free fluid or free air.  Musculoskeletal: Prior right hip replacement. No acute bony abnormality or focal bone lesion.  IMPRESSION: Approximately 3.2 cm soft tissue mass in the region of the hepatic flexure concerning for malignancy.  Large periumbilical hernia again noted containing transverse colon. The mass described above is several cm proximal to the herniated transverse colon and therefore likely not accessible via colonoscopy.  Aortoiliac atherosclerosis.  Assessment and Plan:   Diagnoses and all orders for this visit:  Incisional hernia, without obstruction or gangrene  Essential hypertension  Class 2 severe obesity due to excess calories with serious comorbidity in adult, unspecified BMI (CMS/HHS-HCC)  History of sleeve gastrectomy  Primary osteoarthritis involving multiple joints  Colonic mass  Biochemically recurrent malignant neoplasm of prostate (CMS/HHS-HCC)    Status post sleeve gastrectomy from March 06, 2022. No signs of complications.  has a chronic incisional hernia that is not really reducible.  Unfortunately has a possible mass in his hepatic flexure concerning for malignancy. He is currently scheduled undergo attempt at colonoscopy on November 5.  I told him ideally there needs to be tissue diagnosis and we simply just could not repair his ventral hernia first. More than likely the patient is going to need concomitant colonic resection with incisional hernia repair likely with either absorbable mesh versus a primary repair. Ideally it would be great to have a tissue diagnosis prior. If unable to get the colonoscope past the the  hernia to the area of concern he may just simply need to go the operating room for colonic resection and hernia repair and treat that area of the colon as a malignancy till otherwise proven.  I told him I would send a message to his treatment team and we would formulate a plan. More than likely also present him at GI tumor board as well  Went over the anatomy of the right colon and his hernia. He was given Agricultural engineer.  This patient encounter took 40 minutes today to perform the following: take history, perform exam, review outside records, interpret imaging, counsel the patient on their diagnosis and document encounter, findings & plan in the EHR  Return in about 4 weeks (around 05/23/2023) for LTF-Bariatrics.  Veronica Fretz Sherril Cong, MD General, Minimally Invasive, & Bariatric Surgery     Electronically signed by Gara Kroner, MD at 04/25/2023 2:16 PM EDT

## 2023-05-21 NOTE — Anesthesia Preprocedure Evaluation (Signed)
Anesthesia Evaluation  Patient identified by MRN, date of birth, ID band Patient awake    Reviewed: Allergy & Precautions, NPO status , Patient's Chart, lab work & pertinent test results  Airway Mallampati: II  TM Distance: >3 FB Neck ROM: Full    Dental no notable dental hx.    Pulmonary asthma , COPD,  COPD inhaler, former smoker   Pulmonary exam normal        Cardiovascular hypertension, Pt. on medications Normal cardiovascular exam     Neuro/Psych negative neurological ROS  negative psych ROS   GI/Hepatic Neg liver ROS,GERD  ,,  Endo/Other  diabetes, Type 2    Renal/GU negative Renal ROS     Musculoskeletal  (+) Arthritis , Osteoarthritis,    Abdominal  (+) + obese  Peds  Hematology negative hematology ROS (+)   Anesthesia Other Findings Right hip osteoarthritis  Reproductive/Obstetrics                             Anesthesia Physical Anesthesia Plan  ASA: 3  Anesthesia Plan: General   Post-op Pain Management:    Induction: Intravenous  PONV Risk Score and Plan: 2 and Ondansetron, Treatment may vary due to age or medical condition and Midazolam  Airway Management Planned: Oral ETT  Additional Equipment:   Intra-op Plan:   Post-operative Plan: Extubation in OR  Informed Consent: I have reviewed the patients History and Physical, chart, labs and discussed the procedure including the risks, benefits and alternatives for the proposed anesthesia with the patient or authorized representative who has indicated his/her understanding and acceptance.     Dental advisory given  Plan Discussed with: CRNA  Anesthesia Plan Comments: (PAT note 08/13/2022)       Anesthesia Quick Evaluation

## 2023-05-21 NOTE — Op Note (Signed)
05/21/2023  3:43 PM  PATIENT:  Travis Blanchard  77 y.o. male  PRE-OPERATIVE DIAGNOSIS:  INCARCERATED INCISIONAL HERNIA with transverse colon  4x4cm;  COLONIC LESION  POST-OPERATIVE DIAGNOSIS:  INCARCERATED INCISIONAL HERNIA with transverse colon and omentum;  colon polyps x 3  PROCEDURE:  Procedure(s): LAPAROSCOPIC ASSISTED REPAIR OF INCARCERATED INCISIONAL HERNIA; LYSIS OF ADHESIONS; laparoscopic bilateral tap block - Dr Andrey Campanile  Dr Mansouraty: COLONOSCOPY ENDOSCOPIC MUCOSAL RESECTION POLYPECTOMY SUBMUCOSAL LIFTING INJECTION SUBMUCOSAL TATTOO INJECTION HEMOSTASIS CLIP PLACEMENT HEMOSTASIS CONTROL  SURGEON:  Surgeon(s):  Mansouraty, Netty Starring., MD Gaynelle Adu, MD  ASSISTANTS: Kinsinger, De Blanch, MD   EBL: 50 ml  ANESTHESIA:   general  DRAINS: Urinary Catheter (Foley)   LOCAL MEDICATIONS USED:  MARCAINE    and OTHER exparel  SPECIMEN:  Source of Specimen:  see colonoscopy report  DISPOSITION OF SPECIMEN:  PATHOLOGY  COUNTS:  YES  INDICATION FOR PROCEDURE: 77 year old gentleman who with a incarcerated nonobstructed incisional hernia from prior open prostatectomy came in for incisional hernia repair along with concomitant colonoscopy.  Patient had preoperative imaging consisting of virtual colonoscopy which revealed a probable colonic lesion in the hepatic flexure.  Prior attempts at colonoscopy to that area had failed because the patient's transverse colon was chronically incarcerated in his incisional hernia.  Operative plan was to go in laparoscopically and reduce the herniated colon and then do an on table colonoscopy by gastroenterology.  If the lesion was not amendable to endoscopic/colonoscopic resection then the plan was to proceed with partial colectomy.  If the lesion was amendable to endoscopic resection then the plan was just to do an incisional hernia repair.  All of this was discussed with the patient and discussed and detailed and separately  documented.  PROCEDURE: The patient received 5000 units of subcutaneous heparin preoperatively.  He also received Entereg in case of potential colonic resection.  He received IV antibiotic as well.  The patient was taken to the OR for at [facility] and placed supine on the operating room table.  General endotracheal anesthesia was established.  Sequential compression devices were placed.  A Foley catheter was placed.  His right arm was tucked at the side.  GI had potentially wanted to do the patient in left side down position for his colonoscopy so therefore plans were made to gain access to the abdomen along the right abdomen.  The patient's hernia measured 4 cm x 4 cm.  A surgical timeout was performed after he was prepped and draped in the usual standard surgical fashion with ChloraPrep.  Patient had a moderate incarcerated incisional hernia at the level of the umbilicus.  Optiview technique was used to gain access to the abdomen in the right upper quadrant slightly lateral to Palmer's point.  A 0 degree 5 mm laparoscope was advanced through all layers of the abdominal wall through a 5 mm trocar.  Pneumoperitoneum was smoothly established to patient pressure 15 mmHg.  There is no change in patient vital signs.  The laparoscope was advanced and the abdominal cavity was surveilled.  There was incarcerated omentum and colon in the incisional hernia.  It appeared to be transverse colon.  The patient appeared to have somewhat of a redundant transverse colon.  Additional trocars were placed along the right lateral abdominal wall under direct visualization.  Stated before it was incarcerated with with transverse colon and omentum.  We were able to reduce some of the herniated contents with atraumatic bowel graspers.  My partner was there to  assist with tissue manipulation and reduction on the external side of the abdomen.  We then had to take down some omental adhesions and hernia sac adhesions that were  keeping some of the herniated contents incarcerated.  This was done with a combination of EndoShears with and without electrocautery.  There was some bleeding from the omentum which was difficult to control with electrocautery so therefore harmonic scalpel was obtained.  Ultimately we were able to reduce the incarcerated transverse colon.  I reduced the remaining omentum taking down adhesions were needed from the hernia sac.  The hernia sac appeared a little bit hemorrhagic.  I then inspected the reduced colon and the reduced omentum.  There is no signs of enterotomy or serosal tear.  Hemostasis was achieved in the omentum with harmonic scalpel.  This all took about 30 minutes to take down these adhesions and reduce the incarcerated incisional hernia.  A bilateral laparoscopic tap block was performed for postoperative pain relief  At this time we released pneumoperitoneum.  A half sheet was placed over the abdomen and taped with sterile tape.  At this time gastroenterology came into the operating room and performed an on table colonoscopy with the patient in supine position.  Please see the endoscopist procedure note.  He was able to identify a small polyp in the sending colon and the larger polyp at the hepatic flexure.  This had to be resected with endoscopic mucosal injection assistance.  The area was tattooed.  He then found an additional polyp in the descending colon which was removed in its entirety.  Please see his procedure note for additional information.   At this time we removed the drapes that had been placed over the abdomen and placed a new half sheet on the lower half of the patient.  We returned laparoscopically.  I inspected the bowel.  There is no evidence of full-thickness injury from the lift procedure/biopsies of the colonic lesions from gastroenterology.  The hernia measured 4 x 4 cm.  Because of potential need for future colonic resection I did not feel it was prudent to put permanent mesh  in the abdomen.  Patient had a 3 attenuated skin from his chronic incarcerated incisional hernia.  I elliptically excised the thin skin sharply with a 10 blade.  I then excised some of the hernia sac as well.  I then closed the fascial defect primarily with a running looped #1 PDS 1 from below and run from above tied centrally.  3 interrupted Novafil sutures were placed as well as internal retention sutures.  Subcutaneous tissue was reapproximated transversely with 3-0 Vicryl sutures.  And the skin was then closed with multiple interrupted 3-0 nylon sutures.  Trocars were removed and those skin incisions were closed with a 4-0 Monocryl.  Dermabond was placed over those.  A honeycomb dressing was placed over the transverse abdominal incision.  All needle, instrument, and sponge counts were correct x 2.  There were no immediate complications.  The patient was extubated and taken the recovery room in stable condition.  Hernia Details   Size: 3-10cm and Incarcerated Hernia   PLAN OF CARE: Admit to inpatient   PATIENT DISPOSITION:  PACU - hemodynamically stable.   Delay start of Pharmacological VTE agent (>24hrs) due to surgical blood loss or risk of bleeding:  no  Mary Sella. Andrey Campanile, MD, FACS General, Bariatric, & Minimally Invasive Surgery [facility], [place]

## 2023-05-21 NOTE — H&P (Signed)
GASTROENTEROLOGY PROCEDURE H&P NOTE   Primary Care Physician: Benetta Spar, MD  HPI: Travis Blanchard is a 77 y.o. male who presents for Intraoperative Colonoscopy for evaluation of abnormal CT and colon polyps, unable to be performed in normal endoscopic fashion as a result of severe abdominal hernia and previous failed colonoscopies.  If lesion is noted and not amenable to endoscopic resection, Dr. Andrey Campanile will plan a partial colectomy.  Past Medical History:  Diagnosis Date   B12 deficiency    Biochemically recurrent malignant neoplasm of prostate [facility]) 2007   urologist--- dr gay/  radiation onologist--- dr Kathrynn Running;  2007  s/p open prostatectomy;   local recurrent prostated cancer w/ PSA 5.73   COPD with asthma (HCC)    Diet-controlled type 2 diabetes mellitus (HCC)    followed by pcp;   (03-28-2023   per pt checks blood daily in am , not fasting)   GERD (gastroesophageal reflux disease)    History of adenomatous polyp of colon    History of Helicobacter pylori infection    EGD done @ [facility] by dr reed chronic h. 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 Andrey Campanile (general surgeon)   Ventral incisional hernia    Vitamin D deficiency    Wears glasses    Past Surgical History:  Procedure Laterality Date   COLONOSCOPY  08/01/2017   @[facility]  by dr reed   COLONOSCOPY WITH PROPOFOL N/A 07/18/2017   Procedure: COLONOSCOPY WITH PROPOFOL;  Surgeon: Corbin Ade, MD;  Location: [facility];  Service: Endoscopy;  Laterality: N/A;  8:45   ESOPHAGOGASTRODUODENOSCOPY     HEMORRHOID SURGERY  1974   KNEE ARTHROSCOPY Right    1985;  1990   LAPAROSCOPIC GASTRIC SLEEVE RESECTION N/A 03/06/2022    Procedure: LAPAROSCOPIC SLEEVE GASTRECTOMY;  Surgeon: Gaynelle Adu, MD;  Location: [facility] OR[facility];  Service: General;  Laterality: N/A;   PROSTATECTOMY  2007   open   TOTAL HIP ARTHROPLASTY Right 08/21/2022   Procedure: TOTAL HIP ARTHROPLASTY ANTERIOR APPROACH;  Surgeon: Durene Romans, MD;  Location: [facility] OR[facility];  Service: Orthopedics;  Laterality: Right;   UPPER GI ENDOSCOPY N/A 03/06/2022   Procedure: UPPER GI ENDOSCOPY;  Surgeon: Gaynelle Adu, MD;  Location: [facility];  Service: General;  Laterality: N/A;   Current Facility-Administered Medications  Medication Dose Route Frequency Provider Last Rate Last Admin   0.9 %  sodium chloride infusion   Intravenous Continuous Mansouraty, Netty Starring., MD       lactated ringers infusion   Intravenous Continuous Lewie Loron, MD        Current Facility-Administered Medications:    0.9 %  sodium chloride infusion, , Intravenous, Continuous, Mansouraty, Netty Starring., MD   lactated ringers infusion, , Intravenous, Continuous, Lewie Loron, MD Allergies  Allergen Reactions   Lisinopril Swelling    Facial swelling   Penicillins Anaphylaxis, Hives, Shortness Of Breath, Itching and Other (See Comments)    Has patient had a PCN reaction causing immediate rash, facial/tongue/throat swelling, SOB or lightheadedness with hypotension: Yes Has patient had a PCN reaction causing severe rash involving mucus membranes or skin necrosis: No Has patient had a PCN  reaction that required hospitalization: No Has patient had a PCN reaction occurring within the last 10 years: No If all of the above answers are "NO", then may proceed with Cephalosporin use.    Family History  Problem Relation Age of Onset   Diabetes Mother    Hypertension Mother    Hyperlipidemia Mother    Stroke Mother    Heart disease Mother    Depression Mother    Obesity Mother    Eating disorder Mother    Cancer Father    Depression Father    Anxiety disorder Father    Liver disease Father     Alcoholism Father    Pancreatic cancer Father    Cancer Sister        Breast   Diabetes Sister    Colon cancer Neg Hx    Esophageal cancer Neg Hx    Inflammatory bowel disease Neg Hx    Rectal cancer Neg Hx    Stomach cancer Neg Hx    Social History   Socioeconomic History   Marital status: Married    Spouse name: Romaldo Kulhanek   Number of children: Not on file   Years of education: Not on file   Highest education level: Not on file  Occupational History   Occupation: Musician  Tobacco Use   Smoking status: Former    Current packs/day: 0.00    Average packs/day: 1 pack/day for 25.0 years (25.0 ttl pk-yrs)    Types: Cigarettes    Start date: 03/11/1977    Quit date: 03/11/2002    Years since quitting: 21.2   Smokeless tobacco: Never   Tobacco comments:    03-28-2023  per pt quit smoking 2003 ,  smoked started age 67  Vaping Use   Vaping status: Never Used  Substance and Sexual Activity   Alcohol use: Yes    Comment: occasional   Drug use: Never   Sexual activity: Not on file  Other Topics Concern   Not on file  Social History Narrative   Lives with wife.     Social Determinants of Health   Financial Resource Strain: Not on file  Food Insecurity: No Food Insecurity (02/06/2023)   Hunger Vital Sign    Worried About Running Out of Food in the Last Year: Never true    Ran Out of Food in the Last Year: Never true  Transportation Needs: No Transportation Needs (02/06/2023)   PRAPARE - Administrator, Civil Service (Medical): No    Lack of Transportation (Non-Medical): No  Physical Activity: Not on file  Stress: Not on file  Social Connections: Not on file  Intimate Partner Violence: Not At Risk (02/06/2023)   Humiliation, Afraid, Rape, and Kick questionnaire    Fear of Current or Ex-Partner: No    Emotionally Abused: No    Physically Abused: No    Sexually Abused: No    Physical Exam: Today's Vitals   05/21/23 1035  PainSc: 0-No pain   There  is no height or weight on file to calculate BMI. GEN: NAD EYE: Sclerae anicteric ENT: MMM CV: Non-tachycardic GI: Soft, protuberant abdomen with abdominal hernia noted NEURO:  Alert & Oriented x 3  Lab Results: No results for input(s): "WBC", "HGB", "HCT", "PLT" in the last 72 hours. BMET No results for input(s): "NA", "K", "CL", "CO2", "GLUCOSE", "BUN", "CREATININE", "CALCIUM" in the last 72 hours. LFT No results for input(s): "PROT", "ALBUMIN", "AST", "ALT", "ALKPHOS", "BILITOT", "BILIDIR", "IBILI" in the last  72 hours. PT/INR No results for input(s): "LABPROT", "INR" in the last 72 hours.   Impression / Plan: This is a 77 y.o.male who presents for Intraoperative Colonoscopy for evaluation of abnormal CT and colon polyps, unable to be performed in normal endoscopic fashion as a result of severe abdominal hernia and previous failed colonoscopies.  If lesion is noted and not amenable to endoscopic resection, Dr. Andrey Campanile will plan a partial colectomy.  The risks and benefits of endoscopic evaluation/treatment were discussed with the patient and/or family; these include but are not limited to the risk of perforation, infection, bleeding, missed lesions, lack of diagnosis, severe illness requiring hospitalization, as well as anesthesia and sedation related illnesses.  The patient's history has been reviewed, patient examined, no change in status, and deemed stable for procedure.  The patient and/or family is agreeable to proceed.    Corliss Parish, MD [facility] Office # [phone]

## 2023-05-21 NOTE — Anesthesia Procedure Notes (Signed)
Procedure Name: Intubation Date/Time: 05/21/2023 12:03 PM  Performed by: Elyn Peers, CRNAPre-anesthesia Checklist: Patient identified, Emergency Drugs available, Suction available, Patient being monitored and Timeout performed Patient Re-evaluated:Patient Re-evaluated prior to induction Oxygen Delivery Method: Circle system utilized Preoxygenation: Pre-oxygenation with 100% oxygen Induction Type: IV induction Ventilation: Mask ventilation without difficulty Laryngoscope Size: Mac and 4 Grade View: Grade I Tube type: Oral Tube size: 7.5 mm Number of attempts: 2 Airway Equipment and Method: Stylet Placement Confirmation: ETT inserted through vocal cords under direct vision, positive ETCO2 and breath sounds checked- equal and bilateral Secured at: 23 cm Tube secured with: Tape Dental Injury: Teeth and Oropharynx as per pre-operative assessment  Comments: First look with Miller 3 blade with minimal view of laryngeal opening - switched to MAC 4 with full view of cords.

## 2023-05-22 ENCOUNTER — Encounter (HOSPITAL_COMMUNITY): Payer: Self-pay | Admitting: General Surgery

## 2023-05-22 DIAGNOSIS — D124 Benign neoplasm of descending colon: Secondary | ICD-10-CM | POA: Diagnosis not present

## 2023-05-22 DIAGNOSIS — R933 Abnormal findings on diagnostic imaging of other parts of digestive tract: Secondary | ICD-10-CM | POA: Diagnosis not present

## 2023-05-22 DIAGNOSIS — D122 Benign neoplasm of ascending colon: Secondary | ICD-10-CM | POA: Diagnosis not present

## 2023-05-22 LAB — BASIC METABOLIC PANEL
Anion gap: 10 (ref 5–15)
BUN: 9 mg/dL (ref 8–23)
CO2: 22 mmol/L (ref 22–32)
Calcium: 8.8 mg/dL — ABNORMAL LOW (ref 8.9–10.3)
Chloride: 105 mmol/L (ref 98–111)
Creatinine, Ser: 0.85 mg/dL (ref 0.61–1.24)
GFR, Estimated: >60 mL/min (ref >60)
Glucose, Bld: 134 mg/dL — ABNORMAL HIGH (ref 70–99)
Potassium: 3.8 mmol/L (ref 3.5–5.1)
Sodium: 137 mmol/L (ref 135–145)

## 2023-05-22 LAB — CBC
HCT: 38.5 % — ABNORMAL LOW (ref 39.0–52.0)
Hemoglobin: 13 g/dL (ref 13.0–17.0)
MCH: 30.4 pg (ref 26.0–34.0)
MCHC: 33.8 g/dL (ref 30.0–36.0)
MCV: 90 fL (ref 80.0–100.0)
Platelets: 194 10*3/uL (ref 150–400)
RBC: 4.28 MIL/uL (ref 4.22–5.81)
RDW: 13.1 % (ref 11.5–15.5)
WBC: 8.4 10*3/uL (ref 4.0–10.5)
nRBC: 0 % (ref 0.0–0.2)

## 2023-05-22 LAB — GLUCOSE, CAPILLARY: Glucose-Capillary: 158 mg/dL — ABNORMAL HIGH (ref 70–99)

## 2023-05-22 NOTE — Progress Notes (Signed)
1 Day Post-Op   Subjective/Chief Complaint: No burping/belching no BM Pain ok No n/v Low grade temp   Objective: Vital signs in last 24 hours: Temp:  [97.7 F (36.5 C)-100.5 F (38.1 C)] 100.1 F (37.8 C) (11/13 0559) Pulse Rate:  [74-97] 83 (11/13 0559) Resp:  [14-19] 18 (11/13 0559) BP: (119-162)/(67-85) 141/67 (11/13 0559) SpO2:  [93 %-100 %] 95 % (11/13 0559) Weight:  [116.6 kg] 116.6 kg (11/12 1030) Last BM Date : 05/21/23  Intake/Output from previous day: 11/12 0701 - 11/13 0700 In: 2271.9 [P.O.:800; I.V.:1371.9; IV Piggyback:100] Out: 2805 [Urine:2730; Blood:75] Intake/Output this shift: Total I/O In: -  Out: 300 [Urine:300]  Alert, nontoxic Symm chest rise Reg Soft, incision ok, no cellulitis; approp mild TTP  Lab Results:  Recent Labs    05/22/23 0442  WBC 8.4  HGB 13.0  HCT 38.5*  PLT 194   BMET Recent Labs    05/22/23 0442  NA 137  K 3.8  CL 105  CO2 22  GLUCOSE 134*  BUN 9  CREATININE 0.85  CALCIUM 8.8*   PT/INR No results for input(s): "LABPROT", "INR" in the last 72 hours. ABG No results for input(s): "PHART", "HCO3" in the last 72 hours.  Invalid input(s): "PCO2", "PO2"  Studies/Results: No results found.  Anti-infectives: Anti-infectives (From admission, onward)    Start     Dose/Rate Route Frequency Ordered Stop   05/21/23 1130  ertapenem (INVANZ) 1 g in sodium chloride 0.9 % 100 mL IVPB        1 g 200 mL/hr over 30 Minutes Intravenous On call to O.R. 05/21/23 1124 05/21/23 1234       Assessment/Plan: s/p Procedure(s): LAPAROSCOPIC ASSISTED REPAIR OF INCARCERATED INCISIONAL HERNIA; LYSIS OF ADHESIONS (N/A) COLONOSCOPY (N/A) ENDOSCOPIC MUCOSAL RESECTION POLYPECTOMY SUBMUCOSAL LIFTING INJECTION SUBMUCOSAL TATTOO INJECTION HEMOSTASIS CLIP PLACEMENT HEMOSTASIS CONTROL  Doing well Low grade temp - but no tachycardia, wbc. ?atelectasis - start IS Adv diet as tolerated Mobilize Foley out - has voided  Would like  to monitor since had low grade temp and had extensive polypectomy Discussed intra-op findings  LOS: 1 day    Gaynelle Adu 05/22/2023

## 2023-05-22 NOTE — TOC Initial Note (Signed)
Transition of Care [facility]) - Initial/Assessment Note   Patient Details  Name: Travis Blanchard MRN: [id number] Date of Birth: [birth year]  [facility]) CM/SW Contact:    Ewing Schlein, LCSW Phone Number: 05/22/2023, 3:59 PM  Clinical Narrative: [facility] consulted for HH/DME needs. CSW met with patient and patient requested [facility] for [facility] as his wife is active with the agency and would like to use the same one. Patient has a rolling walker at home, so there are no DME needs at this time. CSW made tentative [facility] referral to [facility] with [facility], which was accepted. TOC awaiting HH orders.                Expected Discharge Plan: Home w Home Health Services Barriers to Discharge: Continued Medical Work up  Patient Goals and CMS Choice Patient states their goals for this hospitalization and ongoing recovery are:: Go home with [facility] through [facility] CMS Medicare.gov Compare Post Acute Care list provided to:: Patient Choice offered to / list presented to : Patient  Expected Discharge Plan and Services In-house Referral: Clinical Social Work Post Acute Care Choice: Home Health Living arrangements for the past 2 months: Single Family Home           DME Arranged: N/A DME Agency: NA HH Agency: [facility] Date HH Agency Contacted: 05/22/23 Time HH Agency Contacted: 1248 Representative spoke with at [facility] Agency: Haywood Lasso  Prior Living Arrangements/Services Living arrangements for the past 2 months: Single Family Home Lives with:: Spouse Patient language and need for interpreter reviewed:: Yes Do you feel safe going back to the place where you live?: Yes      Need for Family Participation in Patient Care: No (Comment) Care giver support system in place?: Yes (comment) Criminal Activity/Legal Involvement Pertinent to Current Situation/Hospitalization: No - Comment as needed  Activities of Daily Living ADL Screening (condition at time of admission) Independently performs ADLs?: Yes  (appropriate for developmental age) Is the patient deaf or have difficulty hearing?: Yes Does the patient have difficulty seeing, even when wearing glasses/contacts?: No Does the patient have difficulty concentrating, remembering, or making decisions?: No  Permission Sought/Granted Permission sought to share information with : Other (comment) Permission granted to share information with : Yes, Verbal Permission Granted Permission granted to share info w AGENCY: [facility]  Emotional Assessment Appearance:: Appears stated age Attitude/Demeanor/Rapport: Engaged Affect (typically observed): Accepting Orientation: : Oriented to Self, Oriented to Place, Oriented to  Time, Oriented to Situation Alcohol / Substance Use: Not Applicable Psych Involvement: No (comment)  Admission diagnosis:  S/P repair of ventral hernia [O96.295, Z87.19] Patient Active Problem List   Diagnosis Date Noted   S/P repair of ventral hernia 05/21/2023   Adenomatous polyp of descending colon 05/21/2023   Polyp of hepatic flexure of colon 05/21/2023   Malignant neoplasm of prostate (HCC) 02/06/2023   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   SOB (shortness of breath) 10/04/2021   Palpitations 10/04/2021   Colon cancer screening 11/22/2020   Abnormal CT of the abdomen 11/22/2020   Other specified abdominal hernia without obstruction or gangrene 11/22/2020   History of Helicobacter pylori infection 09/07/2020   Other hyperlipidemia 07/23/2018   Diabetes mellitus type II, controlled (HCC) 04/10/2018   Essential hypertension 04/10/2018   History of vitamin D deficiency 04/10/2018   Obesity 04/10/2018   History of colonic polyps 05/29/2017   Incisional hernia 03/11/2013   PCP:  Benetta Spar, MD  Pharmacy:   [facility] [address] [place] - [address]  [address] Phone: [phone] Fax: [phone]  Social  Determinants of Health (SDOH) Social History: SDOH Screenings   Food Insecurity: No Food Insecurity (05/21/2023)  Housing: Low Risk  (05/21/2023)  Transportation Needs: No Transportation Needs (05/21/2023)  Utilities: Not At Risk (05/21/2023)  Alcohol Screen: Low Risk  (02/06/2023)  Depression (PHQ2-9): Low Risk  (02/06/2023)  Tobacco Use: Medium Risk (05/21/2023)   SDOH Interventions:    Readmission Risk Interventions    03/06/2022   12:31 PM  Readmission Risk Prevention Plan  Post Dischage Appt Complete  Medication Screening Complete  Transportation Screening Complete

## 2023-05-22 NOTE — Progress Notes (Signed)
    Progress Note   Subjective  Chief Complaint: Abnormal CT of the abdomen  05/21/2023 colonoscopy with three 3-5 mm polyps of the [facility] flexure, ascending colon and in the cecum, 145 mm polyp at the [facility] flexure removed piecemeal treated with STSG to the margin, clip placed, 130 mm polyp in the descending colon removed with mucosal resection, treated this TSC in the margin, clip placed, normal mucosa otherwise with nonbleeding nonthrombosed internal hemorrhoids.  Repeat colonoscopy recommended in 3 months.  05/21/2023 patient also underwent hernia repair.  This morning patient is found after walking the halls.  He is doing well and tells me he is in less pain than he expected.  He is very pleased with Dr. Elesa Hacker work.  Tolerating his diet and denies any new complaints or concerns.   Objective   Vital signs in last 24 hours: Temp:  [97.7 F (36.5 C)-100.5 F (38.1 C)] 100.1 F (37.8 C) (11/13 0559) Pulse Rate:  [74-97] 83 (11/13 0559) Resp:  [14-19] 18 (11/13 0559) BP: (119-162)/(67-85) 141/67 (11/13 0559) SpO2:  [93 %-100 %] 95 % (11/13 0559) Weight:  [116.6 kg] 116.6 kg (11/12 1030) Last BM Date : 05/21/23 General:    AA male in NAD Heart:  Regular rate and rhythm; no murmurs Lungs: Respirations even and unlabored, lungs CTA bilaterally Abdomen:  Soft, nontender and nondistended. Normal bowel sounds. +abdominal binder on limits exam Psych:  Cooperative. Normal mood and affect.  Intake/Output from previous day: 11/12 0701 - 11/13 0700 In: 2271.9 [P.O.:800; I.V.:1371.9; IV Piggyback:100] Out: 2805 [Urine:2730; Blood:75] Intake/Output this shift: Total I/O In: -  Out: 300 [Urine:300]  Lab Results: Recent Labs    05/22/23 0442  WBC 8.4  HGB 13.0  HCT 38.5*  PLT 194   BMET Recent Labs    05/22/23 0442  NA 137  K 3.8  CL 105  CO2 22  GLUCOSE 134*  BUN 9  CREATININE 0.85  CALCIUM 8.8*    Assessment / Plan:   Assessment: 1.  Abnormal CT: Abnormal  virtual colonoscopy 03/19/2023 with a 3.2 cm soft tissue mass in the hepatic flexure and large periumbilical hernia, colonoscopy yesterday with removal of 2 large polyps, see HPI  Plan: 1.  Dr. Meridee Score wants to repeat colonoscopy in 3 months.  Will make sure this gets scheduled. 2.  Plans per surgical team  We will sign off.  Please let us know if we can be of any further assistance.   LOS: 1 day   Unk Lightning  05/22/2023, 10:24 AM

## 2023-05-22 NOTE — Anesthesia Postprocedure Evaluation (Signed)
Anesthesia Post Note  Patient: DMONTAE GAWLIK  Procedure(s) Performed: LAPAROSCOPIC ASSISTED REPAIR OF INCARCERATED INCISIONAL HERNIA; LYSIS OF ADHESIONS COLONOSCOPY ENDOSCOPIC MUCOSAL RESECTION POLYPECTOMY SUBMUCOSAL LIFTING INJECTION SUBMUCOSAL TATTOO INJECTION HEMOSTASIS CLIP PLACEMENT HEMOSTASIS CONTROL     Patient location during evaluation: PACU Anesthesia Type: General Level of consciousness: awake and alert Pain management: pain level controlled Vital Signs Assessment: post-procedure vital signs reviewed and stable Respiratory status: spontaneous breathing, nonlabored ventilation and respiratory function stable Cardiovascular status: blood pressure returned to baseline and stable Postop Assessment: no apparent nausea or vomiting Anesthetic complications: no   No notable events documented.  Last Vitals:  Vitals:   05/22/23 0559 05/22/23 1402  BP: (!) 141/67 118/72  Pulse: 83 81  Resp: 18 18  Temp: 37.8 C 37.3 C  SpO2: 95% 98%    Last Pain:  Vitals:   05/22/23 1402  TempSrc: Oral  PainSc:    Pain Goal: Patients Stated Pain Goal: 4 (05/21/23 1035)                 Lowella Curb

## 2023-05-22 NOTE — Progress Notes (Signed)
Mobility Specialist - Progress Note   05/22/23 1027  Mobility  Activity Ambulated with assistance in hallway  Level of Assistance Standby assist, set-up cues, supervision of patient - no hands on  Assistive Device Front wheel walker  Distance Ambulated (ft) 500 ft  Activity Response Tolerated well  Mobility Referral Yes  $Mobility charge 1 Mobility  Mobility Specialist Start Time (ACUTE ONLY) [id number]  Mobility Specialist Stop Time (ACUTE ONLY) 0948  Mobility Specialist Time Calculation (min) (ACUTE ONLY) 19 min   Pt received in recliner and agreeable to mobility. No complaints during session. Pt to recliner after session with all needs met.   [facility]

## 2023-05-22 NOTE — Plan of Care (Signed)
?  Problem: Clinical Measurements: ?Goal: Will remain free from infection ?Outcome: Progressing ?  ?

## 2023-05-23 ENCOUNTER — Other Ambulatory Visit (HOSPITAL_COMMUNITY): Payer: Self-pay

## 2023-05-23 LAB — CBC
HCT: 39.7 % (ref 39.0–52.0)
Hemoglobin: 12.9 g/dL — ABNORMAL LOW (ref 13.0–17.0)
MCH: 29.7 pg (ref 26.0–34.0)
MCHC: 32.5 g/dL (ref 30.0–36.0)
MCV: 91.3 fL (ref 80.0–100.0)
Platelets: 196 10*3/uL (ref 150–400)
RBC: 4.35 MIL/uL (ref 4.22–5.81)
RDW: 13.2 % (ref 11.5–15.5)
WBC: 9 10*3/uL (ref 4.0–10.5)
nRBC: 0 % (ref 0.0–0.2)

## 2023-05-23 MED ORDER — OXYCODONE HCL 5 MG PO TABS
5.0000 mg | ORAL_TABLET | Freq: Four times a day (QID) | ORAL | 0 refills | Status: DC | PRN
Start: 2023-05-23 — End: 2023-06-26
  Filled 2023-05-23: qty 20, 5d supply, fill #0

## 2023-05-23 NOTE — Plan of Care (Signed)
Problem: Education: Goal: Understanding of discharge needs will improve Outcome: Adequate for Discharge Goal: Verbalization of understanding of the causes of altered bowel function will improve Outcome: Adequate for Discharge   Problem: Activity: Goal: Ability to tolerate increased activity will improve Outcome: Adequate for Discharge   Problem: Bowel/Gastric: Goal: Gastrointestinal status for postoperative course will improve Outcome: Adequate for Discharge   Problem: Health Behavior/Discharge Planning: Goal: Identification of community resources to assist with postoperative recovery needs will improve Outcome: Adequate for Discharge   Problem: Nutritional: Goal: Will attain and maintain optimal nutritional status will improve Outcome: Adequate for Discharge   Problem: Clinical Measurements: Goal: Postoperative complications will be avoided or minimized Outcome: Adequate for Discharge   Problem: Respiratory: Goal: Respiratory status will improve Outcome: Adequate for Discharge   Problem: Skin Integrity: Goal: Will show signs of wound healing Outcome: Adequate for Discharge   Problem: Education: Goal: Knowledge of General Education information will improve Description: Including pain rating scale, medication(s)/side effects and non-pharmacologic comfort measures Outcome: Adequate for Discharge   Problem: Health Behavior/Discharge Planning: Goal: Ability to manage health-related needs will improve Outcome: Adequate for Discharge   Problem: Clinical Measurements: Goal: Ability to maintain clinical measurements within normal limits will improve Outcome: Adequate for Discharge Goal: Will remain free from infection Outcome: Adequate for Discharge Goal: Diagnostic test results will improve Outcome: Adequate for Discharge Goal: Respiratory complications will improve Outcome: Adequate for Discharge Goal: Cardiovascular complication will be avoided Outcome: Adequate for  Discharge   Problem: Activity: Goal: Risk for activity intolerance will decrease Outcome: Adequate for Discharge   Problem: Nutrition: Goal: Adequate nutrition will be maintained Outcome: Adequate for Discharge   Problem: Coping: Goal: Level of anxiety will decrease Outcome: Adequate for Discharge   Problem: Elimination: Goal: Will not experience complications related to bowel motility Outcome: Adequate for Discharge Goal: Will not experience complications related to urinary retention Outcome: Adequate for Discharge   Problem: Pain Management: Goal: General experience of comfort will improve Outcome: Adequate for Discharge   Problem: Safety: Goal: Ability to remain free from injury will improve Outcome: Adequate for Discharge   Problem: Skin Integrity: Goal: Risk for impaired skin integrity will decrease Outcome: Adequate for Discharge   Problem: Education: Goal: Knowledge of General Education information will improve Description: Including pain rating scale, medication(s)/side effects and non-pharmacologic comfort measures Outcome: Adequate for Discharge   Problem: Health Behavior/Discharge Planning: Goal: Ability to manage health-related needs will improve Outcome: Adequate for Discharge   Problem: Clinical Measurements: Goal: Ability to maintain clinical measurements within normal limits will improve Outcome: Adequate for Discharge Goal: Will remain free from infection Outcome: Adequate for Discharge Goal: Diagnostic test results will improve Outcome: Adequate for Discharge Goal: Respiratory complications will improve Outcome: Adequate for Discharge Goal: Cardiovascular complication will be avoided Outcome: Adequate for Discharge   Problem: Activity: Goal: Risk for activity intolerance will decrease Outcome: Adequate for Discharge   Problem: Nutrition: Goal: Adequate nutrition will be maintained Outcome: Adequate for Discharge   Problem: Coping: Goal:  Level of anxiety will decrease Outcome: Adequate for Discharge   Problem: Elimination: Goal: Will not experience complications related to bowel motility Outcome: Adequate for Discharge Goal: Will not experience complications related to urinary retention Outcome: Adequate for Discharge   Problem: Pain Management: Goal: General experience of comfort will improve Outcome: Adequate for Discharge   Problem: Safety: Goal: Ability to remain free from injury will improve Outcome: Adequate for Discharge   Problem: Skin Integrity: Goal: Risk for impaired skin integrity will  decrease Outcome: Adequate for Discharge

## 2023-05-23 NOTE — Progress Notes (Signed)
Mobility Specialist - Progress Note   05/23/23 1045  Mobility  Activity Ambulated with assistance in hallway  Level of Assistance Modified independent, requires aide device or extra time  Assistive Device Front wheel walker  Distance Ambulated (ft) 1500 ft  Activity Response Tolerated well  Mobility Referral Yes  $Mobility charge 1 Mobility  Mobility Specialist Start Time (ACUTE ONLY) 0935  Mobility Specialist Stop Time (ACUTE ONLY) 1003  Mobility Specialist Time Calculation (min) (ACUTE ONLY) 28 min   Pt received in bed and agreeable to mobility. No complaints during session. Pt to recliner after session with all needs met.   [facility]

## 2023-05-23 NOTE — Op Note (Addendum)
[facility] Patient Name: Travis Blanchard Procedure Date: 05/21/2023 MRN: [id number] Attending MD: Corliss Parish , MD, [id number] Date of Birth: May 30, 1946 CSN: [id number] Age: 77 Admit Type: Outpatient Procedure:                Colonoscopy Indications:              Colon polyps of uncertain behavior, Abnormal                            virtual colonoscopy Providers:                Corliss Parish, MD, Priscella Mann, Technician Referring MD:             Mary Sella. Andrey Campanile MD, MD Medicines:                General Anesthesia Complications:            No immediate complications. Estimated Blood Loss:     Estimated blood loss was minimal. Procedure:                Pre-Anesthesia Assessment:                           - Prior to the procedure, a History and Physical                            was performed, and patient medications and                            allergies were reviewed. The patient's tolerance of                            previous anesthesia was also reviewed. The risks                            and benefits of the procedure and the sedation                            options and risks were discussed with the patient.                            All questions were answered, and informed consent                            was obtained. Prior Anticoagulants: The patient has                            taken no anticoagulant or antiplatelet agents. ASA                            Grade Assessment: III - A patient with severe                            systemic disease. After reviewing the risks and  benefits, the patient was deemed in satisfactory                            condition to undergo the procedure.                           After obtaining informed consent, the colonoscope                            was passed under direct vision. Throughout the                            procedure, the patient's blood pressure, pulse, and                             oxygen saturations were monitored continuously. The                            CF-HQ190L ([id number]) Olympus colonoscope was                            introduced through the anus and advanced to the 3                            cm into the ileum. The colonoscopy was technically                            difficult and complex. Successful completion of the                            procedure was aided by straightening and shortening                            the scope to obtain bowel loop reduction and using                            scope torsion. The patient tolerated the procedure.                            The quality of the bowel preparation was adequate.                            The terminal ileum, ileocecal valve, appendiceal                            orifice, and rectum were photographed. Scope In: 1:14:18 PM Scope Out: 2:32:40 PM Scope Withdrawal Time: 1 hour 13 minutes 1 second  Total Procedure Duration: 1 hour 18 minutes 22 seconds  Findings:      The digital rectal exam findings include hemorrhoids. Pertinent       negatives include no palpable rectal lesions.      The terminal ileum and ileocecal valve appeared normal.      Three sessile polyps were found in the hepatic flexure, ascending colon  and cecum. The polyps were 3 to 5 mm in size. These polyps were removed       with a cold snare. Resection and retrieval were complete.      A 45 mm polyp was found in the hepatic flexure. The polyp was mixed       lateral spreading and extended over 2 folds. Preparations were made for       attempt at mucosal resection in an effort of trying to decrease risk of       need for right hemicolectomy. Demarcation of the lesion was performed       with high-definition white light and narrow band imaging to clearly       identify the boundaries of the lesion. EverLift was injected to raise       the lesion. Piecemeal mucosal resection using a snare was  performed.       Resection and retrieval were complete. Resected tissue margins were       examined and clear of polyp tissue. Coagulation for tissue destruction       to the margin using snare tip soft coagulation was successful. To close       a part of the mucosal defect after mucosal resection, one hemostatic       clip was successfully placed (MR conditional). Clip manufacturer: [employer]. Overall the area was felt to be too large with the fold       being in place so to decrease risk of bleeding, the area was       successfully injected with 3 mL PuraStat for hemostasis. Area was       tattooed on the contralateral wall distally with an injection of Spot       (carbon black) for demarcation purposes.      A 30 mm polyp was found in the descending colon. The polyp was granular       lateral spreading. Preparations were made for mucosal resection.       Demarcation of the lesion was performed with high-definition white light       and narrow band imaging to clearly identify the boundaries of the       lesion. Saline was injected to raise the lesion. Snare mucosal resection       was performed en bloc. Resection and retrieval were complete. Resected       tissue margins were examined and clear of polyp tissue. Coagulation for       tissue destruction to the margin using snare tip soft coagulation was       successful. To prevent bleeding after mucosal resection, three       hemostatic clips were successfully placed (MR conditional). Clip       manufacturer: [employer]. There was no bleeding during, or at the       end, of the procedure.      Normal mucosa was found in the entire colon otherwise.      Non-bleeding non-thrombosed internal hemorrhoids were found during       retroflexion, during perianal exam and during digital exam. The       hemorrhoids were Grade II (internal hemorrhoids that prolapse but reduce       spontaneously). Impression:               -  Hemorrhoids found on digital rectal exam.                           -  The examined portion of the ileum was normal.                           - Three 3 to 5 mm polyps at the hepatic flexure, in                            the ascending colon and in the cecum, removed with                            a cold snare. Resected and retrieved.                           - One, 45 mm polyp at the hepatic flexure, removed                            with piecemeal mucosal resection. Resected and                            retrieved. Treated with STSC to the margin. Clip                            (MR conditional) was placed. Clip manufacturer:                            [employer]. Injected PuraStat for additional                            hemostasis. Tattooed distally and on contralateral                            wall.                           - One, 30 mm polyp in the descending colon, removed                            with mucosal resection. Resected and retrieved.                            Treated with STSC to the margin. Clips (MR                            conditional) were placed. Clip manufacturer: [employer].                           - Normal mucosa in the entire examined colon                            otherwise.                           -  Non-bleeding non-thrombosed internal hemorrhoids. Moderate Sedation:      Not Applicable - Patient had care per Anesthesia. Recommendation:           - Continue with Hernia repair as per surgical                            service.                           - Minimize NSAIDs as able for next 1-week.                           - Await pathology results.                           - Repeat colonoscopy in 3 months for surveillance                            based on pathology results.                           - Monitor for signs/symptoms of bleeding,                            perforation, and infection. If  issues please call                            our number to get further assistance as needed.                           - Patient will be seen in follow-up tomorrow in the                            hospital by the inpatient GI team.                           - The findings and recommendations were discussed                            with the referring physician. Procedure Code(s):        --- Professional ---                           [phone], Colonoscopy, flexible; with endoscopic                            mucosal resection                           45385, 59, Colonoscopy, flexible; with removal of                            tumor(s), polyp(s), or other lesion(s) by snare                            technique Diagnosis Code(s):        ---  Professional ---                           K64.1, Second degree hemorrhoids                           D12.3, Benign neoplasm of transverse colon (hepatic                            flexure or splenic flexure)                           D12.2, Benign neoplasm of ascending colon                           D12.0, Benign neoplasm of cecum                           D12.4, Benign neoplasm of descending colon                           D37.4, Neoplasm of uncertain behavior of colon                           R93.3, Abnormal findings on diagnostic imaging of                            other parts of digestive tract CPT copyright 2022 American Medical Association. All rights reserved. The codes documented in this report are preliminary and upon coder review may  be revised to meet current compliance requirements. Corliss Parish, MD 05/21/2023 3:25:48 PM Number of Addenda: 0

## 2023-05-23 NOTE — TOC Transition Note (Signed)
Transition of Care [facility]) - CM/SW Discharge Note  Patient Details  Name: Travis Blanchard MRN: [id number] Date of Birth: 12-May-1946  Transition of Care [facility]) CM/SW Contact:  Ewing Schlein, LCSW Phone Number: 05/23/2023, 10:58 AM  Clinical Narrative: HH orders placed for PT and OT. CSW updated Lynette with [facility] regarding orders. CSW provided patient with [facility] agency list per his request. TOC signing off.  Final next level of care: Home w Home Health Services Barriers to Discharge: Barriers Resolved  Patient Goals and CMS Choice CMS Medicare.gov Compare Post Acute Care list provided to:: Patient Choice offered to / list presented to : Patient  Discharge Plan and Services Additional resources added to the After Visit Summary for   In-house Referral: Clinical Social Work Post Acute Care Choice: Home Health          DME Arranged: N/A DME Agency: NA HH Arranged: PT, OT HH Agency: [facility] Date HH Agency Contacted: 05/22/23 Time HH Agency Contacted: 1248 Representative spoke with at [facility] Agency: Haywood Lasso  Social Determinants of Health (SDOH) Interventions SDOH Screenings   Food Insecurity: No Food Insecurity (05/21/2023)  Housing: Low Risk  (05/21/2023)  Transportation Needs: No Transportation Needs (05/21/2023)  Utilities: Not At Risk (05/21/2023)  Alcohol Screen: Low Risk  (02/06/2023)  Depression (PHQ2-9): Low Risk  (02/06/2023)  Tobacco Use: Medium Risk (05/21/2023)   Readmission Risk Interventions    03/06/2022   12:31 PM  Readmission Risk Prevention Plan  Post Dischage Appt Complete  Medication Screening Complete  Transportation Screening Complete

## 2023-05-23 NOTE — Progress Notes (Signed)
Assessment unchanged. Pt verbalized understanding of dc instructions including medications and follow up care. Discharged via foot per request to front entrance to meet son.

## 2023-05-24 NOTE — Discharge Summary (Signed)
Physician Discharge Summary  Travis Blanchard:[id number] DOB: 06/26/1946 DOA: 05/21/2023  PCP: Benetta Spar, MD  Admit date: 05/21/2023 Discharge date: 05/23/2023  Recommendations for Outpatient Follow-up:     Follow-up Information     [facility] Follow up.   Why: [facility] will provide PT and OT in the home after discharge.        Gaynelle Adu, MD. Nyra Capes on 06/13/2023.   Specialty: General Surgery Why: at arrive 11:45 AM - please arrive at 11:30 AM, For wound re-check Contact information: [address] [place] [address] [phone]         [facility], [place]. Go on 06/04/2023.   Specialty: General Surgery Why: at 10 AM for nurse visit for, For suture removal Contact information: [address] [place] [address] [phone]                Discharge Diagnoses:  Incarcerated incisional hernia s/p Colon polyps s/p colonoscopy, polypectomy History of sleeve gastrectomy Hypertension Hyperlipidemia obesity  Surgical Procedure: Laparoscopic assisted repair of incarcerated incisional hernia, lysis of adhesions November 12-Dr. 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  These had been normal until a few months ago when there was concern for a lesion in the hepatic flexure that was approximately 3 cm.  Plan was to bring patient in for incisional hernia repair, reduce the transverse colon from the hernia site and then allow GI medicine to do an on table  colonoscopy and then repair the fascial defect.  Hospital Course:  Patient was brought in for planned procedure.  We went in laparoscopically and were able to reduce the incarcerated incisional hernia containing omentum and transverse colon.  Once that was done gastroenterology came into the operating room and performed an on table colonoscopy.  The gastroenterologist was able to identify the lesion of concern in the hepatic flexure.  He also found a polyp in the ascending colon which was removed.  The hepatic flexure polyp was removed in piecemeal fashion along with tattoo injection and a clip being placed.  There was also a polyp in the descending colon which was removed with mucosal resection along with another clip being placed.  I then primarily repaired his incisional hernia and excised some of the redundant skin.  He was kept overnight for observation.  On postoperative day 0 in the evening and into postoperative day morning he had a Tmax of 100.  He was tolerating a clear liquid diet.  Otherwise his vital signs were stable.  But because he had had a low-grade temperature and had some ambulation issues initially he was kept an additional night.  Mobility aids assisted the patient.  We arranged for home health PT and OT.  On postop day 2 he was doing well.  His pain was controlled.  He was ambulating better.  He was tolerating a diet.  There is no signs of complications.  I discussed discharge instructions and the patient was felt stable for discharge.   BP 119/71 (BP Location: Right Arm)   Pulse  82   Temp 99.3 F (37.4 C) (Oral)   Resp 18   Ht 5\' 10"  (1.778 m)   Wt 116.6 kg   SpO2 98%   BMI 36.88 kg/m   Gen: alert, NAD, non-toxic appearing Pupils: equal, no scleral icterus Pulm: Lungs clear to auscultation, symmetric chest rise CV: regular rate and rhythm Abd: soft, mild approp tender, nondistended. . No cellulitis. No incisional hernia Ext: no edema, no calf tenderness Skin: no rash,  no jaundice   Discharge Instructions  Discharge Instructions     Call MD for:   Complete by: As directed    Temperature >101   Call MD for:  hives   Complete by: As directed    Call MD for:  persistant dizziness or light-headedness   Complete by: As directed    Call MD for:  persistant nausea and vomiting   Complete by: As directed    Call MD for:  redness, tenderness, or signs of infection (pain, swelling, redness, odor or green/yellow discharge around incision site)   Complete by: As directed    Call MD for:  severe uncontrolled pain   Complete by: As directed    Diet general   Complete by: As directed    Discharge instructions   Complete by: As directed    See [facility] discharge instructions   Increase activity slowly   Complete by: As directed       Allergies as of 05/23/2023       Reactions   Lisinopril Swelling   Facial swelling   Penicillins Anaphylaxis, Hives, Shortness Of Breath, Itching, Other (See Comments)   Has patient had a PCN reaction causing immediate rash, facial/tongue/throat swelling, SOB or lightheadedness with hypotension: Yes Has patient had a PCN reaction causing severe rash involving mucus membranes or skin necrosis: No Has patient had a PCN reaction that required hospitalization: No Has patient had a PCN reaction occurring within the last 10 years: No If all of the above answers are "NO", then may proceed with Cephalosporin use.        Medication List     STOP taking these medications    ipratropium-albuterol 0.5-2.5 (3) MG/3ML Soln Commonly known as: DUONEB   psyllium 58.6 % powder Commonly known as: METAMUCIL       TAKE these medications    acetaminophen 650 MG CR tablet Commonly known as: TYLENOL Take 650 mg by mouth every 8 (eight) hours as needed for pain.   albuterol 108 (90 Base) MCG/ACT inhaler Commonly known as: Ventolin HFA Inhale 1 puff into the lungs every 6 (six) hours as needed for wheezing or shortness of breath.    amLODipine 5 MG tablet Commonly known as: NORVASC Take 5 mg by mouth daily.   atorvastatin 20 MG tablet Commonly known as: LIPITOR Take 20 mg by mouth daily.   BARIATRIC MULTIVITAMINS/IRON PO Take 1 capsule by mouth 3 (three) times daily.   CALCIUM-VITAMIN D PO Take 1 tablet by mouth in the morning and at bedtime.   cyanocobalamin 1000 MCG tablet Commonly known as: VITAMIN B12 Take 1,000 mcg by mouth daily.   fluticasone 50 MCG/ACT nasal spray Commonly known as: FLONASE Place 1 spray into both nostrils daily as needed for allergies.   loratadine 10 MG tablet Commonly known as: CLARITIN Take 10 mg by mouth daily.   omeprazole 20 MG capsule Commonly known as: PRILOSEC Take 20 mg by mouth daily as needed (acid reflux).   oxyCODONE 5 MG immediate release tablet Commonly  known as: Oxy IR/ROXICODONE Take 1 tablet (5 mg total) by mouth every 6 (six) hours as needed for severe pain (pain score 7-10).   polyethylene glycol 17 g packet Commonly known as: MIRALAX / GLYCOLAX Take 17 g by mouth 2 (two) times daily.   TURMERIC PO Take 1,000 mg by mouth 2 (two) times daily.   Vitamin D3 25 MCG (1000 UT) Caps Take 1,000 Units by mouth daily.   zolpidem 10 MG tablet Commonly known as: AMBIEN Take 10 mg by mouth at bedtime.        Follow-up Information     [facility] [place] Follow up.   Why: [facility] will provide PT and OT in the home after discharge.        Gaynelle Adu, MD. Nyra Capes on 06/13/2023.   Specialty: General Surgery Why: at arrive 11:45 AM - please arrive at 11:30 AM, For wound re-check Contact information: [address] [place] [address] [phone]         [facility] [place]. Go on 06/04/2023.   Specialty: General Surgery Why: at 10 AM for nurse visit for, For suture removal Contact information: [address] [place] [address] [phone]                  The results of significant  diagnostics from this hospitalization (including imaging, microbiology, ancillary and laboratory) are listed below for reference.    Significant Diagnostic Studies: No results found.  Microbiology: No results found for this or any previous visit (from the past 240 hour(s)).   Labs: Basic Metabolic Panel: Recent Labs  Lab 05/22/23 0442  NA 137  K 3.8  CL 105  CO2 22  GLUCOSE 134*  BUN 9  CREATININE 0.85  CALCIUM 8.8*   Liver Function Tests: No results for input(s): "AST", "ALT", "ALKPHOS", "BILITOT", "PROT", "ALBUMIN" in the last 168 hours. No results for input(s): "LIPASE", "AMYLASE" in the last 168 hours. No results for input(s): "AMMONIA" in the last 168 hours. CBC: Recent Labs  Lab 05/22/23 0442 05/23/23 0438  WBC 8.4 9.0  HGB 13.0 12.9*  HCT 38.5* 39.7  MCV 90.0 91.3  PLT 194 196   Cardiac Enzymes: No results for input(s): "CKTOTAL", "CKMB", "CKMBINDEX", "TROPONINI" in the last 168 hours. BNP: BNP (last 3 results) No results for input(s): "BNP" in the last 8760 hours.  ProBNP (last 3 results) No results for input(s): "PROBNP" in the last 8760 hours.  CBG: Recent Labs  Lab 05/21/23 1035 05/21/23 1558 05/22/23 2136  GLUCAP 118* 160* 158*    Principal Problem:   S/P repair of ventral hernia Active Problems:   Adenomatous polyp of descending colon   Polyp of hepatic flexure of colon   Time coordinating discharge: 25 min  Signed:  Atilano Ina, MD [facility] Surgery,  [phone] 05/24/2023, 2:23 PM

## 2023-05-24 NOTE — Progress Notes (Signed)
RN spoke with patient to follow up after recent inpatient colonoscopy.  Patient was discharged on 11/14, and reports he is feeling well.   RN updated patient that we will await recommendations from Dr. Meridee Score before proceeding with prostate treatment.  Patient reports he is suppose to be hearing from MD today.   RN will follow up with patient next week, patient agreeable.

## 2023-05-25 DIAGNOSIS — E119 Type 2 diabetes mellitus without complications: Secondary | ICD-10-CM | POA: Diagnosis not present

## 2023-05-25 DIAGNOSIS — E7849 Other hyperlipidemia: Secondary | ICD-10-CM | POA: Diagnosis not present

## 2023-05-25 DIAGNOSIS — Z96641 Presence of right artificial hip joint: Secondary | ICD-10-CM | POA: Diagnosis not present

## 2023-05-25 DIAGNOSIS — Z8546 Personal history of malignant neoplasm of prostate: Secondary | ICD-10-CM | POA: Diagnosis not present

## 2023-05-25 DIAGNOSIS — Z79899 Other long term (current) drug therapy: Secondary | ICD-10-CM | POA: Diagnosis not present

## 2023-05-25 DIAGNOSIS — M159 Polyosteoarthritis, unspecified: Secondary | ICD-10-CM | POA: Diagnosis not present

## 2023-05-25 DIAGNOSIS — Z860101 Personal history of adenomatous and serrated colon polyps: Secondary | ICD-10-CM | POA: Diagnosis not present

## 2023-05-25 DIAGNOSIS — E559 Vitamin D deficiency, unspecified: Secondary | ICD-10-CM | POA: Diagnosis not present

## 2023-05-25 DIAGNOSIS — Z9181 History of falling: Secondary | ICD-10-CM | POA: Diagnosis not present

## 2023-05-25 DIAGNOSIS — K219 Gastro-esophageal reflux disease without esophagitis: Secondary | ICD-10-CM | POA: Diagnosis not present

## 2023-05-25 DIAGNOSIS — Z87891 Personal history of nicotine dependence: Secondary | ICD-10-CM | POA: Diagnosis not present

## 2023-05-25 DIAGNOSIS — I10 Essential (primary) hypertension: Secondary | ICD-10-CM | POA: Diagnosis not present

## 2023-05-25 DIAGNOSIS — I708 Atherosclerosis of other arteries: Secondary | ICD-10-CM | POA: Diagnosis not present

## 2023-05-25 DIAGNOSIS — E66812 Obesity, class 2: Secondary | ICD-10-CM | POA: Diagnosis not present

## 2023-05-25 DIAGNOSIS — E538 Deficiency of other specified B group vitamins: Secondary | ICD-10-CM | POA: Diagnosis not present

## 2023-05-25 DIAGNOSIS — Z9884 Bariatric surgery status: Secondary | ICD-10-CM | POA: Diagnosis not present

## 2023-05-25 DIAGNOSIS — K6389 Other specified diseases of intestine: Secondary | ICD-10-CM | POA: Diagnosis not present

## 2023-05-25 DIAGNOSIS — Z6837 Body mass index (BMI) 37.0-37.9, adult: Secondary | ICD-10-CM | POA: Diagnosis not present

## 2023-05-25 DIAGNOSIS — Z48815 Encounter for surgical aftercare following surgery on the digestive system: Secondary | ICD-10-CM | POA: Diagnosis not present

## 2023-05-25 DIAGNOSIS — J4489 Other specified chronic obstructive pulmonary disease: Secondary | ICD-10-CM | POA: Diagnosis not present

## 2023-05-27 ENCOUNTER — Other Ambulatory Visit: Payer: Self-pay

## 2023-05-27 ENCOUNTER — Encounter: Payer: Self-pay | Admitting: Gastroenterology

## 2023-05-27 ENCOUNTER — Telehealth: Payer: Self-pay | Admitting: Gastroenterology

## 2023-05-27 DIAGNOSIS — E118 Type 2 diabetes mellitus with unspecified complications: Secondary | ICD-10-CM | POA: Diagnosis not present

## 2023-05-27 DIAGNOSIS — C189 Malignant neoplasm of colon, unspecified: Secondary | ICD-10-CM

## 2023-05-27 DIAGNOSIS — I10 Essential (primary) hypertension: Secondary | ICD-10-CM | POA: Diagnosis not present

## 2023-05-27 LAB — SURGICAL PATHOLOGY

## 2023-05-27 MED ORDER — HYDROCORTISONE ACETATE 25 MG RE SUPP: 25.0000 mg | Freq: Two times a day (BID) | RECTAL | 0 refills | Status: DC

## 2023-05-27 NOTE — Telephone Encounter (Signed)
Thanks Patty. GM 

## 2023-05-27 NOTE — Telephone Encounter (Signed)
Received message from Dr. Tawana Scale office about this patient having called his office with 1 episode of darker red blood admixed with brown stool this morning.  This is a patient that I worked together with Dr. Andrey Campanile last week and removed 2 large polyps after he had his ventral hernia decompressed in the operating room.  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  I am still in the process of reviewing the patient's pathology with pathology before we make the next steps in our plan of action. Patient was able to be discharged a few days after his hiatal hernia and colonoscopy hospitalization.  Patient has had bowel movements that have been brown in quality up to 2 times per day.  This the first time he noticed blood. Patient states that the stools were not hard and he had no pain with passage of the bowel movement. Patient is having intermittent abdominal discomfort in the area of his ventral hernia but this is tolerable with Tylenol as needed.  Here is the plan of action we discussed 1) I will have my team reach out this afternoon to check and see if anything else is changed with the patient's status 2) I will have my team reach out tomorrow morning to follow-up with the patient as well 3) if patient has continued episodes of rectal bleeding with brown stool, we will then plan to treat as hemorrhoidal and send Anusol suppositories 4) patient has continued episodes of rectal bleeding with purple stool or significant bleeding he can call us and we will direct to the hospital in case he needs a repeat colonoscopy.  The patient agrees with this plan of action.  I will forward this to my team and to Dr. Tawana Scale team so that they are aware.

## 2023-05-27 NOTE — Telephone Encounter (Signed)
Spoke with the pt and he tells me that he has no further bleeding at this time. He has been asked to call tomorrow and give [name] an update or sooner if needed.

## 2023-05-27 NOTE — Telephone Encounter (Signed)
Mansouraty, Netty Starring., MD  Loretha Stapler, RN Cc: Gaynelle Adu, MD; Edmonia Caprio, RN Travis Blanchard, I spoke with the patient about these results and have sent you a results letter to release to him. Here is the plan of action for now -Colonoscopy with EMR 90-minute slot in to 4 months -CT chest/abdomen/pelvis with IV and oral contrast for colon cancer staging to be done in the next 2 to 3 weeks (to allow him a little bit more time to heal from his recent ventral hernia repair) -CBC/CMP/CEA level to be obtained in next few weeks as patient is able - Referral to genetics for further discussion of potential genetic testing in setting of recently diagnosed colon cancer (post endoscopic resection) Thanks. GM  Beth, Can this patient be added for MDC discussion on 12/4?  Discussed pathology findings and recommendations with colorectal surgery and oncology as to high risk surveillance for surgery.  FYI EW

## 2023-05-27 NOTE — Telephone Encounter (Signed)
When calling to discuss the results of the patient's colonoscopy from last week, this afternoon stated that he had another brown bowel movement with blood around (separate result note and result letter for that discussion), the patient bowel movement and on the toilet paper and in the toilet bowl.  He has no symptomatology of lightheadedness, dizziness, chest pressure, chest pain, progressive or worsening abdominal pain.  He is going to send pictures via MyChart for [name] to review.  I will have my team send in suppository therapy to treat what hopefully is hemorrhoidal bleeding.  I will have my team reach out to the patient and for him to send updated images of his stool tomorrow.  He knows that if things progress or if they are continuing and not improving that he may need to be sent to the emergency department and admitted for possible repeat colonoscopy.  Hopefully that is not the case.   Patty, Please send in Anusol suppositories. 1 blister pack. Twice daily until the blister pack is completed. Please reach out and see how he is doing tomorrow. Would like for him to come in for a CBC and CMP tomorrow if possible.   Corliss Parish, MD [facility] Office # [phone]

## 2023-05-27 NOTE — Telephone Encounter (Signed)
The pt has been advised that he will need labs this week He also is aware that he will get calls from genetics and the schedulers for the CT scan. He will pick up the prescription for anusol.  He will call tomorrow for update

## 2023-05-28 ENCOUNTER — Encounter (HOSPITAL_COMMUNITY): Payer: Self-pay | Admitting: Emergency Medicine

## 2023-05-28 ENCOUNTER — Other Ambulatory Visit: Payer: Self-pay

## 2023-05-28 ENCOUNTER — Emergency Department (HOSPITAL_COMMUNITY)
Admission: EM | Admit: 2023-05-28 | Discharge: 2023-05-28 | Disposition: A | Discharge status: Home / Self Care | Payer: Medicare Other | Attending: Emergency Medicine | Admitting: Emergency Medicine

## 2023-05-28 ENCOUNTER — Emergency Department (HOSPITAL_COMMUNITY): Payer: Medicare Other

## 2023-05-28 DIAGNOSIS — Z8546 Personal history of malignant neoplasm of prostate: Secondary | ICD-10-CM | POA: Diagnosis not present

## 2023-05-28 DIAGNOSIS — Z96641 Presence of right artificial hip joint: Secondary | ICD-10-CM | POA: Diagnosis not present

## 2023-05-28 DIAGNOSIS — J439 Emphysema, unspecified: Secondary | ICD-10-CM | POA: Diagnosis not present

## 2023-05-28 DIAGNOSIS — R0789 Other chest pain: Secondary | ICD-10-CM | POA: Diagnosis not present

## 2023-05-28 DIAGNOSIS — J9811 Atelectasis: Secondary | ICD-10-CM | POA: Diagnosis not present

## 2023-05-28 DIAGNOSIS — R0602 Shortness of breath: Secondary | ICD-10-CM | POA: Diagnosis not present

## 2023-05-28 DIAGNOSIS — M51369 Other intervertebral disc degeneration, lumbar region without mention of lumbar back pain or lower extremity pain: Secondary | ICD-10-CM | POA: Insufficient documentation

## 2023-05-28 DIAGNOSIS — R0989 Other specified symptoms and signs involving the circulatory and respiratory systems: Secondary | ICD-10-CM | POA: Diagnosis not present

## 2023-05-28 DIAGNOSIS — R188 Other ascites: Secondary | ICD-10-CM | POA: Diagnosis not present

## 2023-05-28 DIAGNOSIS — R079 Chest pain, unspecified: Secondary | ICD-10-CM | POA: Diagnosis not present

## 2023-05-28 DIAGNOSIS — I700 Atherosclerosis of aorta: Secondary | ICD-10-CM | POA: Diagnosis not present

## 2023-05-28 DIAGNOSIS — D62 Acute posthemorrhagic anemia: Secondary | ICD-10-CM | POA: Diagnosis not present

## 2023-05-28 DIAGNOSIS — C186 Malignant neoplasm of descending colon: Secondary | ICD-10-CM

## 2023-05-28 DIAGNOSIS — I2510 Atherosclerotic heart disease of native coronary artery without angina pectoris: Secondary | ICD-10-CM | POA: Insufficient documentation

## 2023-05-28 DIAGNOSIS — K921 Melena: Secondary | ICD-10-CM | POA: Diagnosis not present

## 2023-05-28 DIAGNOSIS — K436 Other and unspecified ventral hernia with obstruction, without gangrene: Secondary | ICD-10-CM | POA: Diagnosis not present

## 2023-05-28 DIAGNOSIS — C61 Malignant neoplasm of prostate: Secondary | ICD-10-CM | POA: Diagnosis not present

## 2023-05-28 DIAGNOSIS — R109 Unspecified abdominal pain: Secondary | ICD-10-CM | POA: Insufficient documentation

## 2023-05-28 DIAGNOSIS — J432 Centrilobular emphysema: Secondary | ICD-10-CM | POA: Diagnosis not present

## 2023-05-28 DIAGNOSIS — I771 Stricture of artery: Secondary | ICD-10-CM | POA: Diagnosis not present

## 2023-05-28 LAB — COMPREHENSIVE METABOLIC PANEL
ALT: 30 U/L (ref 0–44)
AST: 28 U/L (ref 15–41)
Albumin: 3.5 g/dL (ref 3.5–5.0)
Alkaline Phosphatase: 80 U/L (ref 38–126)
Anion gap: 9 (ref 5–15)
BUN: 12 mg/dL (ref 8–23)
CO2: 24 mmol/L (ref 22–32)
Calcium: 8.7 mg/dL — ABNORMAL LOW (ref 8.9–10.3)
Chloride: 106 mmol/L (ref 98–111)
Creatinine, Ser: 0.7 mg/dL (ref 0.61–1.24)
GFR, Estimated: >60 mL/min (ref >60)
Glucose, Bld: 107 mg/dL — ABNORMAL HIGH (ref 70–99)
Potassium: 3.9 mmol/L (ref 3.5–5.1)
Sodium: 139 mmol/L (ref 135–145)
Total Bilirubin: 0.6 mg/dL (ref <1.2)
Total Protein: 6.7 g/dL (ref 6.5–8.1)

## 2023-05-28 LAB — BRAIN NATRIURETIC PEPTIDE: B Natriuretic Peptide: 78.5 pg/mL (ref 0.0–100.0)

## 2023-05-28 LAB — CBC WITH DIFFERENTIAL/PLATELET
Abs Immature Granulocytes: 0.04 10*3/uL (ref 0.00–0.07)
Basophils Absolute: 0 10*3/uL (ref 0.0–0.1)
Basophils Relative: 0 %
Eosinophils Absolute: 0.1 10*3/uL (ref 0.0–0.5)
Eosinophils Relative: 1 %
HCT: 35 % — ABNORMAL LOW (ref 39.0–52.0)
Hemoglobin: 11.1 g/dL — ABNORMAL LOW (ref 13.0–17.0)
Immature Granulocytes: 1 %
Lymphocytes Relative: 24 %
Lymphs Abs: 2 10*3/uL (ref 0.7–4.0)
MCH: 29.6 pg (ref 26.0–34.0)
MCHC: 31.7 g/dL (ref 30.0–36.0)
MCV: 93.3 fL (ref 80.0–100.0)
Monocytes Absolute: 0.5 10*3/uL (ref 0.1–1.0)
Monocytes Relative: 6 %
Neutro Abs: 5.7 10*3/uL (ref 1.7–7.7)
Neutrophils Relative %: 68 %
Platelets: 230 10*3/uL (ref 150–400)
RBC: 3.75 MIL/uL — ABNORMAL LOW (ref 4.22–5.81)
RDW: 12.6 % (ref 11.5–15.5)
WBC: 8.3 10*3/uL (ref 4.0–10.5)
nRBC: 0 % (ref 0.0–0.2)

## 2023-05-28 LAB — POC OCCULT BLOOD, ED: Fecal Occult Bld: POSITIVE — AB

## 2023-05-28 LAB — TROPONIN I (HIGH SENSITIVITY)
Troponin I (High Sensitivity): 3 ng/L (ref <18)
Troponin I (High Sensitivity): 4 ng/L (ref <18)

## 2023-05-28 LAB — D-DIMER, QUANTITATIVE: D-Dimer, Quant: 2.79 ug{FEU}/mL — ABNORMAL HIGH (ref 0.00–0.50)

## 2023-05-28 MED ORDER — MORPHINE SULFATE (PF) 4 MG/ML IV SOLN
4.0000 mg | Freq: Once | INTRAVENOUS | Status: AC
  Administered 2023-05-28: 4 mg via INTRAVENOUS
  Filled 2023-05-28: qty 1

## 2023-05-28 MED ORDER — IOHEXOL 350 MG/ML SOLN
75.0000 mL | Freq: Once | INTRAVENOUS | Status: AC | PRN
  Administered 2023-05-28: 75 mL via INTRAVENOUS

## 2023-05-28 MED ORDER — HYDROCORTISONE ACETATE 25 MG RE SUPP
25.0000 mg | Freq: Two times a day (BID) | RECTAL | Status: DC
  Administered 2023-05-28: 25 mg via RECTAL
  Filled 2023-05-28 (×2): qty 1

## 2023-05-28 NOTE — Consult Note (Addendum)
Consultation  Referring Provider:   ER Primary Care Physician:  Benetta Spar, MD Primary Gastroenterologist:  Dr. Meridee Score       Reason for Consultation:     rectal bleeding after large polypectomy with SOB/CP DOA: 05/28/2023         Hospital Day: 1         HPI:   Travis Blanchard is a 77 y.o. male with past medical history significant for arthritis, hypertension, hyperlipidemia, diabetes mellitus type 2, obesity, prostate cancer status post prostatectomy 2007 without radiation or chemotherapy, umbilical hernia, H. Pylori  and colon polyps with recent polypectomy of 45 and 30 mm polyps at hepatic flexure and descending colon on 05/21/2023 with ventral hernia repair.   03/19/2023 virtual colonoscopy with 3.2 cm soft tissue mass in the hepatic flexure and large periumbilical hernia  05/21/2023 colonoscopy with three 3-5 mm polyps of the hepatic flexure, ascending colon and in the cecum, 45 mm polyp at the hepatic flexure removed piecemeal treated with STSG to the margin, clip placed, 30 mm polyp in the descending colon removed with mucosal resection, treated this TSC in the margin, clip placed, normal mucosa otherwise with nonbleeding nonthrombosed internal hemorrhoids. Pathology showed evidence of adenocarcinoma with involvement of submucosa, other polyp guessed adenoma. Plan to repeat colonoscopy 4 months with EMR site, CT chest abdomen pelvis for colon cancer staging, plan for CEA and potential genetic testing. 05/21/2023 laparoscopic repair of incarcerated ventral hernia with lysis of adhesions with Dr. Andrey Campanile 05/27/2023 patient called our office with 1 episode of dark blood with a bowel movement, soft BMs.  Patient was sent in Anusol suppositories 05/28/2023 patient presented to the ER with shortness of breath and chest discomfort.  Work up notable for  D-dimer 2.79, initial troponin 3 pending repeat troponin Hgb 11.1 down from 13 6 days ago BNP 78 BUN 12, creatinine  0.70 Pending chest x-ray  No family was present at the time of my evaluation. Patient had 2 episodes of dark maroon loose brown stool, last one being 1 PM yesterday with some mucus.  Patient has not had another bowel movement since 1 PM yesterday.    Bowel movements prior to this for 1-2 daily brown, soft no history of hematochezia. Patient had 1 episode of rectal discomfort yesterday prior to picking up Anusol suppositories which he did once last night and states this did help rectal pain. Denies hematochezia, abdominal pain, nausea or vomiting. States day of discharge last Tuesday or Wednesday patient had a 5-minute episode of epigastric discomfort with diaphoresis and some shortness of breath but this resolved quickly.   And then Sunday he began to have gradual worsening left-sided chest discomfort, sharp, no radiation, worse with deep inspiration, no true shortness of breath but felt he could not get a good breath with the discomfort.  Not worse with change in position.  Denies coughing, wheezing, leg swelling. Patient denies history of blood clots, no family history of blood clots.  Abnormal ED labs: Abnormal Labs Reviewed  CBC WITH DIFFERENTIAL/PLATELET - Abnormal; Notable for the following components:      Result Value   RBC 3.75 (*)    Hemoglobin 11.1 (*)    HCT 35.0 (*)    All other components within normal limits  COMPREHENSIVE METABOLIC PANEL - Abnormal; Notable for the following components:   Glucose, Bld 107 (*)    Calcium 8.7 (*)    All other components within normal limits  D-DIMER, QUANTITATIVE (  NOT AT [facility]) - Abnormal; Notable for the following components:   D-Dimer, Quant 2.79 (*)    All other components within normal limits    Past Medical History:  Diagnosis Date   B12 deficiency    Biochemically recurrent malignant neoplasm of prostate [facility]) 2007   urologist--- dr gay/  radiation onologist--- dr Kathrynn Running;  2007  s/p open prostatectomy;   local recurrent prostated  cancer w/ PSA 5.73   COPD with asthma (HCC)    Diet-controlled type 2 diabetes mellitus (HCC)    followed by pcp;   (03-28-2023   per pt checks blood daily in am , not fasting)   GERD (gastroesophageal reflux disease)    History of adenomatous polyp of colon    History of Helicobacter pylori infection    EGD done @ [facility] by dr reed chronic h. 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 Andrey Campanile (general surgeon)   Ventral incisional hernia    Vitamin D deficiency    Wears glasses     Surgical History:  He  has a past surgical history that includes Prostatectomy (2007); Colonoscopy (08/01/2017); Colonoscopy with propofol (N/A, 07/18/2017); Hemorrhoid surgery (1974); Knee arthroscopy (Right); Laparoscopic gastric sleeve resection (N/A, 03/06/2022); Upper gi endoscopy (N/A, 03/06/2022); Total hip arthroplasty (Right, 08/21/2022); Esophagogastroduodenoscopy; Laparoscopic partial colectomy (N/A, 05/21/2023); Colonoscopy (N/A, 05/21/2023); Endoscopic mucosal resection (05/21/2023); polypectomy (05/21/2023); Submucosal lifting injection (05/21/2023); Submucosal tattoo injection (05/21/2023); Hemostasis clip placement (05/21/2023); and Hemostasis control (05/21/2023). Family History:  His family history includes Alcoholism in his father; Anxiety disorder in his father; Cancer in his father and sister; Depression in his father and mother; Diabetes in his mother and sister; Eating disorder in his mother; Heart disease in his mother; Hyperlipidemia in his mother; Hypertension in his mother; Liver disease in his father; Obesity in his mother; Pancreatic cancer in his father; Stroke in his mother. Social History:   reports that he  quit smoking about 21 years ago. His smoking use included cigarettes. He started smoking about 46 years ago. He has a 25 pack-year smoking history. He has never used smokeless tobacco. He reports current alcohol use. He reports that he does not use drugs.  Prior to Admission medications   Medication Sig Start Date End Date Taking? Authorizing Provider  acetaminophen (TYLENOL) 650 MG CR tablet Take 650 mg by mouth every 8 (eight) hours as needed for pain.    [provider]  albuterol (VENTOLIN HFA) 108 (90 Base) MCG/ACT inhaler Inhale 1 puff into the lungs every 6 (six) hours as needed for wheezing or shortness of breath. 11/10/18   Corinna Capra A, DO  amLODipine (NORVASC) 5 MG tablet Take 5 mg by mouth daily. 08/29/20   [provider]  atorvastatin (LIPITOR) 20 MG tablet Take 20 mg by mouth daily.  03/04/13   [provider]  CALCIUM-VITAMIN D PO Take 1 tablet by mouth in the morning and at bedtime.    [provider]  Cholecalciferol (VITAMIN D3) 1000 units CAPS Take 1,000 Units by mouth daily.    [provider]  fluticasone (FLONASE) 50 MCG/ACT nasal spray Place 1 spray into both nostrils daily as  needed for allergies.    [provider]  hydrocortisone (ANUSOL-HC) 25 MG suppository Place 1 suppository (25 mg total) rectally 2 (two) times daily. 05/27/23   Mansouraty, Netty Starring., MD  loratadine (CLARITIN) 10 MG tablet Take 10 mg by mouth daily.    [provider]  metroNIDAZOLE (FLAGYL) 500 MG tablet PLEASE SEE ATTACHED FOR DETAILED DIRECTIONS 05/02/23   [provider]  Multiple Vitamins-Minerals (BARIATRIC MULTIVITAMINS/IRON PO) Take 1 capsule by mouth 3 (three) times daily.    [provider]  Na Sulfate-K Sulfate-Mg Sulf 17.5-3.13-1.6 GM/177ML SOLN See admin instructions. 05/15/23   [provider]  neomycin (MYCIFRADIN) 500 MG tablet PLEASE SEE ATTACHED FOR DETAILED DIRECTIONS 05/02/23   [provider]  omeprazole (PRILOSEC) 20 MG capsule Take 20 mg by mouth daily as needed (acid reflux). 07/08/20   [provider]  oxyCODONE (OXY IR/ROXICODONE) 5 MG immediate release tablet Take 1 tablet (5 mg total) by mouth every 6 (six) hours as needed for severe pain (pain score 7-10). 05/23/23   Gaynelle Adu, MD  polyethylene glycol (MIRALAX / GLYCOLAX) 17 g packet Take 17 g by mouth 2 (two) times daily. Patient not taking: Reported on 03/28/2023 08/22/22   Cassandria Anger, PA-C  TURMERIC PO Take 1,000 mg by mouth 2 (two) times daily.    [provider]  vitamin B-12 (CYANOCOBALAMIN) 1000 MCG tablet Take 1,000 mcg by mouth daily.    [provider]  zolpidem (AMBIEN) 10 MG tablet Take 10 mg by mouth at bedtime. 12/08/12   [provider]    No current facility-administered medications for this encounter.   Current Outpatient Medications  Medication Sig Dispense Refill   acetaminophen (TYLENOL) 650 MG CR tablet Take 650 mg by mouth every 8 (eight) hours as needed for pain.     albuterol (VENTOLIN HFA) 108 (90 Base) MCG/ACT inhaler Inhale 1 puff into the lungs every 6 (six) hours as needed for wheezing or shortness of breath. 1 Inhaler 0   amLODipine (NORVASC) 5 MG tablet Take 5 mg by mouth daily.     atorvastatin (LIPITOR) 20 MG tablet Take 20 mg by mouth daily.      CALCIUM-VITAMIN D PO Take 1 tablet by mouth in the morning and at bedtime.     Cholecalciferol (VITAMIN D3) 1000 units CAPS Take 1,000 Units by mouth daily.     fluticasone (FLONASE) 50 MCG/ACT nasal spray Place 1 spray into both nostrils daily as needed for allergies.     hydrocortisone (ANUSOL-HC) 25 MG suppository Place 1 suppository (25 mg total) rectally 2 (two) times daily. 12 suppository 0   loratadine (CLARITIN) 10 MG tablet Take 10 mg by mouth daily.     metroNIDAZOLE (FLAGYL) 500 MG tablet PLEASE SEE ATTACHED FOR DETAILED DIRECTIONS     Multiple Vitamins-Minerals (BARIATRIC  MULTIVITAMINS/IRON PO) Take 1 capsule by mouth 3 (three) times daily.     Na Sulfate-K Sulfate-Mg Sulf 17.5-3.13-1.6 GM/177ML SOLN See admin instructions.     neomycin (MYCIFRADIN) 500 MG tablet PLEASE SEE ATTACHED FOR DETAILED DIRECTIONS     omeprazole (PRILOSEC) 20 MG capsule Take 20 mg by mouth daily as needed (acid reflux).     oxyCODONE (OXY IR/ROXICODONE) 5 MG immediate release tablet Take 1 tablet (5 mg total) by mouth every 6 (six) hours as needed for severe pain (pain score 7-10). 20 tablet 0   polyethylene glycol (MIRALAX / GLYCOLAX) 17 g packet Take 17 g by mouth 2 (two) times daily. (  Patient not taking: Reported on 03/28/2023) 14 each 0   TURMERIC PO Take 1,000 mg by mouth 2 (two) times daily.     vitamin B-12 (CYANOCOBALAMIN) 1000 MCG tablet Take 1,000 mcg by mouth daily.     zolpidem (AMBIEN) 10 MG tablet Take 10 mg by mouth at bedtime.      Allergies as of 05/28/2023 - Review Complete 05/28/2023  Allergen Reaction Noted   Lisinopril Swelling 09/07/2020   Penicillins Anaphylaxis, Hives, Shortness Of Breath, Itching, and Other (See Comments) 03/11/2013    Review of Systems:    Constitutional: No weight loss, fever, chills, weakness or fatigue HEENT: Eyes: No change in vision               Ears, Nose, Throat:  No change in hearing or congestion Skin: No rash or itching Cardiovascular: No chest pain, chest pressure or palpitations   Respiratory: No SOB or cough Gastrointestinal: See HPI and otherwise negative Genitourinary: No dysuria or change in urinary frequency Neurological: No headache, dizziness or syncope Musculoskeletal: No new muscle or joint pain Hematologic: No bleeding or bruising Psychiatric: No history of depression or anxiety     Physical Exam:  Vital signs in last 24 hours: Temp:  [98.6 F (37 C)] 98.6 F (37 C) (11/19 0837) Pulse Rate:  [75-81] 78 (11/19 1030) Resp:  [14-19] 15 (11/19 1030) BP: (131-164)/(76-81) 131/76 (11/19 1030) SpO2:  [95 %-100  %] 98 % (11/19 1030) Weight:  [116.6 kg] 116.6 kg (11/19 0837)   Last BM recorded by nurses in past 5 days No data recorded  General:   Pleasant, obese well developed male in no acute distress Head:  Normocephalic and atraumatic. Eyes: sclerae anicteric,conjunctive pink  Heart:  regular rate and rhythm, no murmurs or gallops Pulm: Clear anteriorly; no wheezing Abdomen:  Soft, Obese AB, Active bowel sounds. mild tenderness around well-healed horizontal surgical scar lower abdomen. Without guarding and Without rebound, No organomegaly appreciated. Rectal: Normal external rectal exam, normal to decreased rectal tone, appreciated internal hemorrhoids, non-tender, no masses, scant brown stool with some BRB gross blood, hemoccult Positive Extremities: Nonpitting bilateral edema, no warmth, negative Homans, no erythema. Msk:  Symmetrical without gross deformities. Peripheral pulses intact.  Neurologic:  Alert and  oriented x4;  No focal deficits.  Skin:   Dry and intact without significant lesions or rashes. Psychiatric:  Cooperative. Normal mood and affect.  LAB RESULTS: Recent Labs    05/28/23 1002  WBC 8.3  HGB 11.1*  HCT 35.0*  PLT 230   BMET Recent Labs    05/28/23 1002  NA 139  K 3.9  CL 106  CO2 24  GLUCOSE 107*  BUN 12  CREATININE 0.70  CALCIUM 8.7*   LFT Recent Labs    05/28/23 1002  PROT 6.7  ALBUMIN 3.5  AST 28  ALT 30  ALKPHOS 80  BILITOT 0.6   PT/INR No results for input(s): "LABPROT", "INR" in the last 72 hours.  STUDIES: No results found.    Impression    Rectal bleeding post polypectomy with EMR on 05/21/2023 2 g drop in hemoglobin over 6 days, really positive FOBT on rectal exam with scant brown stool and red blood HD stable, not on blood thinners Concern for post polypectomy bleeding versus hemorrhoidal bleeding 45 mm polyp at the hepatic flexure removed piecemeal treated with STSG to the margin, clip placed 30 mm polyp in the descending  colon removed with mucosal resection, treated this TSC in the margin, clip  placed  Colon adenocarcinoma Will get CEA while here Has pending repeat colonoscopy in the hospital 3 to 4 months Has outpatient referral to genetic counseling pending CT chest abdomen pelvis for colon cancer staging  Shortness of breath with chest pain D-dimer elevated, pending CTA to rule out PE, high risk with recent surgery and adenocarcinoma Troponin negative, EKG unremarkable  Other notable co morbidities Hypertension Hyperlipidemia Type 2 diabetes History of prostate cancer status post prostatectomy 2007  Active Problems:   * No active hospital problems. *    LOS: 0 days     Plan   -Concern for post polypectomy bleeding versus internal hemorrhoids, patient describes darker stools which raises concern for right sided bleeding from hepatic flexure however on rectal exam had more bright red blood with internal hemorrhoids, last bloody stool 1 PM yesterday decreasing concern for active GI bleeding. -Will plan on Anusol suppositories -Clear liquid diet. NPO at midnight -Continue to monitor CBC, transfuse greater than 7 -If patient continues to have rectal bleeding we will need to plan on repeat colonoscopy to evaluate hepatic flexure site/descending colon for large polypectomy which was performed -Currently pending CT angio for elevated D-dimer and chest discomfort worse with inspiration, if patient needs to be placed on blood thinners will need to consider this with potential colonoscopy -Will get CEA while inpatient - pending CT AB and pelvis for staging - continue supportive care   Thank you for your kind consultation, we will continue to follow.   Doree Albee  05/28/2023, 11:49 AM   Attending Physician Note   I have taken a history, reviewed the chart and examined the patient. I performed more than 50% of this encounter in conjunction with the APP. I agree with the APP's note, impression  and recommendations with my edits. My additional impressions and recommendations are as follows.   Post polypectomy hematochezia and small volume BRB on DRE today which is likely hemorrhoidal. Mild ABL anemia. No recurrent bleeding today. Has CP and SOB. Colonoscopy 11/12 with piecemeal polypectomy of a 45 mm hepatic flexure polyp (TA) and EMR of a 30 mm descending colon polyp (superficial invasive adenocarcinoma in a TA with HGD). S/P incarcerated ventral hernia repair and LOA on 11/12. CT AP today without evidence of metastatic disease. CTA chest negative.   Plans have been made for outpatient mgmt.  Rest at home for next few days.  Avoid ASA/NSAIDs for 2 weeks.  Anusol supp PR qd for 5 days then qd prn.  Outpatient CBC on Monday under Dr. Meridee Score.  Repeat colonoscopy if significant non hemorrhoidal re-bleeding occurs. Outpatient GI follow up with Dr. Meridee Score.   Claudette Head, MD [facility] See [facility], [facility], for our on call provider

## 2023-05-28 NOTE — ED Triage Notes (Signed)
Patient arrives ambulatory by POV c/o left chest pain over past few days. Describes it as a knife in his chest and that is has been constant.

## 2023-05-28 NOTE — Telephone Encounter (Signed)
Patient called stated he is currently at the ED not sure if he can get the labs done there or not. Please advise.

## 2023-05-28 NOTE — ED Notes (Signed)
Patient transported to CT 

## 2023-05-28 NOTE — ED Provider Notes (Signed)
[facility] AT [facility] Provider Note   CSN: [id number] Arrival date & time: 05/28/23  2025     History  Chief Complaint  Patient presents with   Chest Pain    Travis Blanchard is a 77 y.o. male.  HPI  77 year old male who is status post laparoscopic repair of incarcerated incisional hernia with lysis of adhesions 1 week ago presents emergency department with left-sided chest pain.  Patient describes it as a sharp knifelike sensation in the left side of the chest.  Worse with any deep inspiration.  Pain is not changed with position.  He at times feels short of breath but denies any cough.  Pain does not radiate into his back or lower abdomen.  He denies any swelling of his lower extremities.  Last had a stress test about a year ago for preoperative clearance that was unremarkable.  Home Medications Prior to Admission medications   Medication Sig Start Date End Date Taking? Authorizing Provider  acetaminophen (TYLENOL) 650 MG CR tablet Take 650 mg by mouth every 8 (eight) hours as needed for pain.    [provider]  albuterol (VENTOLIN HFA) 108 (90 Base) MCG/ACT inhaler Inhale 1 puff into the lungs every 6 (six) hours as needed for wheezing or shortness of breath. 11/10/18   Corinna Capra A, DO  amLODipine (NORVASC) 5 MG tablet Take 5 mg by mouth daily. 08/29/20   [provider]  atorvastatin (LIPITOR) 20 MG tablet Take 20 mg by mouth daily.  03/04/13   [provider]  CALCIUM-VITAMIN D PO Take 1 tablet by mouth in the morning and at bedtime.    [provider]  Cholecalciferol (VITAMIN D3) 1000 units CAPS Take 1,000 Units by mouth daily.    [provider]  fluticasone (FLONASE) 50 MCG/ACT nasal spray Place 1 spray into both nostrils daily as needed for allergies.    [provider]  hydrocortisone (ANUSOL-HC) 25 MG suppository Place 1 suppository (25 mg total) rectally 2 (two) times daily. 05/27/23    Mansouraty, Netty Starring., MD  loratadine (CLARITIN) 10 MG tablet Take 10 mg by mouth daily.    [provider]  Multiple Vitamins-Minerals (BARIATRIC MULTIVITAMINS/IRON PO) Take 1 capsule by mouth 3 (three) times daily.    [provider]  omeprazole (PRILOSEC) 20 MG capsule Take 20 mg by mouth daily as needed (acid reflux). 07/08/20   [provider]  oxyCODONE (OXY IR/ROXICODONE) 5 MG immediate release tablet Take 1 tablet (5 mg total) by mouth every 6 (six) hours as needed for severe pain (pain score 7-10). 05/23/23   Gaynelle Adu, MD  polyethylene glycol (MIRALAX / GLYCOLAX) 17 g packet Take 17 g by mouth 2 (two) times daily. Patient not taking: Reported on 03/28/2023 08/22/22   Cassandria Anger, PA-C  TURMERIC PO Take 1,000 mg by mouth 2 (two) times daily.    [provider]  vitamin B-12 (CYANOCOBALAMIN) 1000 MCG tablet Take 1,000 mcg by mouth daily.    [provider]  zolpidem (AMBIEN) 10 MG tablet Take 10 mg by mouth at bedtime. 12/08/12   [provider]      Allergies    Lisinopril and Penicillins    Review of Systems   Review of Systems  Constitutional:  Positive for fatigue. Negative for fever.  Respiratory:  Positive for shortness of breath. Negative for cough, chest tightness and wheezing.   Cardiovascular:  Positive for chest pain. Negative for palpitations and  leg swelling.  Gastrointestinal:  Negative for abdominal pain, diarrhea and vomiting.  Genitourinary:  Negative for flank pain.  Musculoskeletal:  Negative for back pain.  Skin:  Negative for rash.  Neurological:  Negative for headaches.    Physical Exam Updated Vital Signs BP (!) 142/81   Pulse 75   Temp 98.6 F (37 C) (Oral)   Resp 14   Ht 5\' 10"  (1.778 m)   Wt 116.6 kg   SpO2 95%   BMI 36.88 kg/m  Physical Exam Vitals and nursing note reviewed.  Constitutional:      General: He is not in acute distress.    Appearance: Normal appearance.  HENT:      Head: Normocephalic.     Mouth/Throat:     Mouth: Mucous membranes are moist.  Cardiovascular:     Rate and Rhythm: Normal rate.  Pulmonary:     Effort: Pulmonary effort is normal. No respiratory distress.     Breath sounds: Examination of the right-lower field reveals decreased breath sounds. Examination of the left-lower field reveals decreased breath sounds. Decreased breath sounds present. No wheezing.     Comments: Pain with deep inspiration and sometimes with conversation Abdominal:     Palpations: Abdomen is soft.     Tenderness: There is no abdominal tenderness.  Musculoskeletal:     Right lower leg: No edema.     Left lower leg: No edema.  Skin:    General: Skin is warm.  Neurological:     Mental Status: He is alert and oriented to person, place, and time. Mental status is at baseline.  Psychiatric:        Mood and Affect: Mood normal.     ED Results / Procedures / Treatments   Labs (all labs ordered are listed, but only abnormal results are displayed) Labs Reviewed  CBC WITH DIFFERENTIAL/PLATELET  COMPREHENSIVE METABOLIC PANEL  BRAIN NATRIURETIC PEPTIDE  D-DIMER, QUANTITATIVE  TROPONIN I (HIGH SENSITIVITY)    EKG EKG Interpretation Date/Time:  Tuesday May 28 2023 08:39:08 EST Ventricular Rate:  85 PR Interval:  194 QRS Duration:  84 QT Interval:  363 QTC Calculation: 432 R Axis:   23  Text Interpretation: Sinus rhythm Confirmed by Coralee Pesa ([id number]) on 05/28/2023 9:02:37 AM  Radiology No results found.  Procedures Procedures    Medications Ordered in ED Medications  morphine (PF) 4 MG/ML injection 4 mg (has no administration in time range)    ED Course/ Medical Decision Making/ A&P                                 Medical Decision Making Amount and/or Complexity of Data Reviewed Labs: ordered. Radiology: ordered.  Risk Prescription drug management.   77 year old male presents emergency department intermittently sharp shooting  left-sided chest pain.  Worse with deep inspiration.  Had recent surgery a week ago for incarcerated incisional hernia.  No complications in regards to surgery.  Currently at rest feels slightly improved.  Denies any shortness of breath or hemoptysis.  EKG is reassuring.  Blood work shows normal troponins but elevated D-dimer.  In the setting of recent surgery will rule out PE.  He also has some mild abdominal pain that does not seem acute but will include the abdomen and CT imaging.  CT shows no PE.  Does identify a fluid collection along the incision that is most likely a postoperative seroma versus developing abscess.  He  has no white count or fever, the incision looks great and is soft, no redness, drainage or findings of cellulitis.  I consulted on-call surgery, spoke with PA Marisue Ivan who is reviewed the imaging with me.  We doubt acute abscess and will defer to outpatient reevaluation at this time.  She plans to let Dr. Andrey Campanile know.  He has follow-up appointment this week with surgery.  Patient at this time appears safe and stable for discharge and close outpatient follow up. Discharge plan and strict return to ED precautions discussed, patient verbalizes understanding and agreement.        Final Clinical Impression(s) / ED Diagnoses Final diagnoses:  None    Rx / DC Orders ED Discharge Orders     None         Rozelle Logan, DO 05/28/23 1546

## 2023-05-28 NOTE — Telephone Encounter (Signed)
Patty, I am sorry to hear this. I reviewed his labs, and it does look like he has dropped his hemoglobin slightly since his discharge.  I am putting my Inpatient GI team on here, to see if they have opportunity to review and see how patient is doing and see if the rectal bleeding is persisting, if it is, then I think observation vs potential need for repeat colonoscopy will need to be considered, unless he stopped bleeding.

## 2023-05-28 NOTE — ED Notes (Signed)
X ray in room.

## 2023-05-28 NOTE — Discharge Instructions (Addendum)
You have been seen and discharged from the emergency department.  In regards to your chest pain, I believe this is most likely musculoskeletal.  Your heart workup was normal and the CT of your chest was normal.  The CT of your abdomen shows a small area of fluid collection where the incision was made.  This is most likely normal postoperatively.  Currently this does not seem to be a source of infection, on-call surgery team has been notified and will follow the imaging and with you in the office. Continue your outpatient   Follow-up with your primary provider for further evaluation and further care. Take home medications as prescribed. If you have any worsening symptoms or further concerns for your health please return to an emergency department for further evaluation.

## 2023-05-28 NOTE — Telephone Encounter (Signed)
Dr Meridee Score see message from pt -- He is currently in the ED for SOB and Chest pain.

## 2023-05-29 LAB — CEA: CEA: 2.8 ng/mL (ref 0.0–4.7)

## 2023-05-30 DIAGNOSIS — E119 Type 2 diabetes mellitus without complications: Secondary | ICD-10-CM | POA: Diagnosis not present

## 2023-05-30 DIAGNOSIS — I10 Essential (primary) hypertension: Secondary | ICD-10-CM | POA: Diagnosis not present

## 2023-05-30 DIAGNOSIS — K6389 Other specified diseases of intestine: Secondary | ICD-10-CM | POA: Diagnosis not present

## 2023-05-30 DIAGNOSIS — E66812 Obesity, class 2: Secondary | ICD-10-CM | POA: Diagnosis not present

## 2023-05-30 DIAGNOSIS — M159 Polyosteoarthritis, unspecified: Secondary | ICD-10-CM | POA: Diagnosis not present

## 2023-05-30 DIAGNOSIS — Z48815 Encounter for surgical aftercare following surgery on the digestive system: Secondary | ICD-10-CM | POA: Diagnosis not present

## 2023-05-31 NOTE — Progress Notes (Signed)
Patient will be presented at the GI Tumor Board on 12/4.   RN will follow after to review next steps and ensure we follow close to proceed with prostate cancer treatment post GI treatment.

## 2023-06-03 DIAGNOSIS — I10 Essential (primary) hypertension: Secondary | ICD-10-CM | POA: Diagnosis not present

## 2023-06-03 DIAGNOSIS — E119 Type 2 diabetes mellitus without complications: Secondary | ICD-10-CM | POA: Diagnosis not present

## 2023-06-03 DIAGNOSIS — E66812 Obesity, class 2: Secondary | ICD-10-CM | POA: Diagnosis not present

## 2023-06-03 DIAGNOSIS — M159 Polyosteoarthritis, unspecified: Secondary | ICD-10-CM | POA: Diagnosis not present

## 2023-06-03 DIAGNOSIS — Z48815 Encounter for surgical aftercare following surgery on the digestive system: Secondary | ICD-10-CM | POA: Diagnosis not present

## 2023-06-03 DIAGNOSIS — K6389 Other specified diseases of intestine: Secondary | ICD-10-CM | POA: Diagnosis not present

## 2023-06-05 DIAGNOSIS — Z48815 Encounter for surgical aftercare following surgery on the digestive system: Secondary | ICD-10-CM | POA: Diagnosis not present

## 2023-06-05 DIAGNOSIS — M159 Polyosteoarthritis, unspecified: Secondary | ICD-10-CM | POA: Diagnosis not present

## 2023-06-05 DIAGNOSIS — K6389 Other specified diseases of intestine: Secondary | ICD-10-CM | POA: Diagnosis not present

## 2023-06-05 DIAGNOSIS — I10 Essential (primary) hypertension: Secondary | ICD-10-CM | POA: Diagnosis not present

## 2023-06-05 DIAGNOSIS — E119 Type 2 diabetes mellitus without complications: Secondary | ICD-10-CM | POA: Diagnosis not present

## 2023-06-05 DIAGNOSIS — E66812 Obesity, class 2: Secondary | ICD-10-CM | POA: Diagnosis not present

## 2023-06-05 DIAGNOSIS — E118 Type 2 diabetes mellitus with unspecified complications: Secondary | ICD-10-CM | POA: Diagnosis not present

## 2023-06-09 LAB — MOLECULAR PATHOLOGY

## 2023-06-10 DIAGNOSIS — M159 Polyosteoarthritis, unspecified: Secondary | ICD-10-CM | POA: Diagnosis not present

## 2023-06-10 DIAGNOSIS — E119 Type 2 diabetes mellitus without complications: Secondary | ICD-10-CM | POA: Diagnosis not present

## 2023-06-10 DIAGNOSIS — E66812 Obesity, class 2: Secondary | ICD-10-CM | POA: Diagnosis not present

## 2023-06-10 DIAGNOSIS — Z48815 Encounter for surgical aftercare following surgery on the digestive system: Secondary | ICD-10-CM | POA: Diagnosis not present

## 2023-06-10 DIAGNOSIS — K6389 Other specified diseases of intestine: Secondary | ICD-10-CM | POA: Diagnosis not present

## 2023-06-10 DIAGNOSIS — I10 Essential (primary) hypertension: Secondary | ICD-10-CM | POA: Diagnosis not present

## 2023-06-12 ENCOUNTER — Other Ambulatory Visit: Payer: Self-pay | Admitting: *Deleted

## 2023-06-12 ENCOUNTER — Encounter: Payer: Self-pay | Admitting: Gastroenterology

## 2023-06-12 NOTE — Progress Notes (Signed)
The proposed treatment discussed in conference is for discussion purpose only and is not a binding recommendation.  The patients have not been physically examined, or presented with their treatment options.  Therefore, final treatment plans cannot be decided.  

## 2023-06-13 ENCOUNTER — Inpatient Hospital Stay: Payer: Medicare Other | Admitting: Genetic Counselor

## 2023-06-13 ENCOUNTER — Inpatient Hospital Stay: Payer: Medicare Other

## 2023-06-13 DIAGNOSIS — E66812 Obesity, class 2: Secondary | ICD-10-CM | POA: Diagnosis not present

## 2023-06-13 DIAGNOSIS — Z8719 Personal history of other diseases of the digestive system: Secondary | ICD-10-CM | POA: Diagnosis not present

## 2023-06-13 DIAGNOSIS — Z903 Acquired absence of stomach [part of]: Secondary | ICD-10-CM | POA: Diagnosis not present

## 2023-06-13 DIAGNOSIS — E669 Obesity, unspecified: Secondary | ICD-10-CM | POA: Diagnosis not present

## 2023-06-13 DIAGNOSIS — E1169 Type 2 diabetes mellitus with other specified complication: Secondary | ICD-10-CM | POA: Diagnosis not present

## 2023-06-13 DIAGNOSIS — I10 Essential (primary) hypertension: Secondary | ICD-10-CM | POA: Diagnosis not present

## 2023-06-13 DIAGNOSIS — Z9889 Other specified postprocedural states: Secondary | ICD-10-CM | POA: Diagnosis not present

## 2023-06-14 ENCOUNTER — Telehealth: Payer: Self-pay | Admitting: Radiation Oncology

## 2023-06-14 DIAGNOSIS — E119 Type 2 diabetes mellitus without complications: Secondary | ICD-10-CM | POA: Diagnosis not present

## 2023-06-14 DIAGNOSIS — I10 Essential (primary) hypertension: Secondary | ICD-10-CM | POA: Diagnosis not present

## 2023-06-14 DIAGNOSIS — M159 Polyosteoarthritis, unspecified: Secondary | ICD-10-CM | POA: Diagnosis not present

## 2023-06-14 DIAGNOSIS — E66812 Obesity, class 2: Secondary | ICD-10-CM | POA: Diagnosis not present

## 2023-06-14 DIAGNOSIS — K6389 Other specified diseases of intestine: Secondary | ICD-10-CM | POA: Diagnosis not present

## 2023-06-14 DIAGNOSIS — Z48815 Encounter for surgical aftercare following surgery on the digestive system: Secondary | ICD-10-CM | POA: Diagnosis not present

## 2023-06-14 NOTE — Progress Notes (Signed)
The pt has been advised of the results and recommendations.  He agrees to the 3-4 month Colon EMR. Recall entered

## 2023-06-14 NOTE — Telephone Encounter (Signed)
LVM to schedule FUN with Dr. Kathrynn Running and PA Bruning.

## 2023-06-14 NOTE — Telephone Encounter (Signed)
Called pt to offer next available with Dr. Kathrynn Running and PA Bruning. Pt became upset stating "I've told you all that I need afternoon appts" - pt was advised this was my first time ever speaking with him but that I would need to c/b after speaking with PA to find a better suited appt time for him.

## 2023-06-14 NOTE — Progress Notes (Signed)
Patient's case discussed at [facility] this week. Review of radiology and pathology.  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  This means that curative intent has been achieved.  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  Will plan for this in 3 to 4 months from the time of his procedure.  We will relay this information to the patient and get him on the books for follow-up.  From a GI perspective, there is no contraindication to moving forward with any planned interventions for his known prostate cancer.   Corliss Parish, MD [facility] Office # [phone]

## 2023-06-17 DIAGNOSIS — Z48815 Encounter for surgical aftercare following surgery on the digestive system: Secondary | ICD-10-CM | POA: Diagnosis not present

## 2023-06-17 DIAGNOSIS — K6389 Other specified diseases of intestine: Secondary | ICD-10-CM | POA: Diagnosis not present

## 2023-06-17 DIAGNOSIS — M159 Polyosteoarthritis, unspecified: Secondary | ICD-10-CM | POA: Diagnosis not present

## 2023-06-17 DIAGNOSIS — I10 Essential (primary) hypertension: Secondary | ICD-10-CM | POA: Diagnosis not present

## 2023-06-17 DIAGNOSIS — E66812 Obesity, class 2: Secondary | ICD-10-CM | POA: Diagnosis not present

## 2023-06-17 DIAGNOSIS — E119 Type 2 diabetes mellitus without complications: Secondary | ICD-10-CM | POA: Diagnosis not present

## 2023-06-19 DIAGNOSIS — K6389 Other specified diseases of intestine: Secondary | ICD-10-CM | POA: Diagnosis not present

## 2023-06-19 DIAGNOSIS — I10 Essential (primary) hypertension: Secondary | ICD-10-CM | POA: Diagnosis not present

## 2023-06-19 DIAGNOSIS — M159 Polyosteoarthritis, unspecified: Secondary | ICD-10-CM | POA: Diagnosis not present

## 2023-06-19 DIAGNOSIS — Z48815 Encounter for surgical aftercare following surgery on the digestive system: Secondary | ICD-10-CM | POA: Diagnosis not present

## 2023-06-19 DIAGNOSIS — E119 Type 2 diabetes mellitus without complications: Secondary | ICD-10-CM | POA: Diagnosis not present

## 2023-06-19 DIAGNOSIS — E66812 Obesity, class 2: Secondary | ICD-10-CM | POA: Diagnosis not present

## 2023-06-24 DIAGNOSIS — Z48815 Encounter for surgical aftercare following surgery on the digestive system: Secondary | ICD-10-CM | POA: Diagnosis not present

## 2023-06-24 DIAGNOSIS — Z860101 Personal history of adenomatous and serrated colon polyps: Secondary | ICD-10-CM | POA: Diagnosis not present

## 2023-06-24 DIAGNOSIS — Z9181 History of falling: Secondary | ICD-10-CM | POA: Diagnosis not present

## 2023-06-24 DIAGNOSIS — Z6837 Body mass index (BMI) 37.0-37.9, adult: Secondary | ICD-10-CM | POA: Diagnosis not present

## 2023-06-24 DIAGNOSIS — Z8546 Personal history of malignant neoplasm of prostate: Secondary | ICD-10-CM | POA: Diagnosis not present

## 2023-06-24 DIAGNOSIS — E119 Type 2 diabetes mellitus without complications: Secondary | ICD-10-CM | POA: Diagnosis not present

## 2023-06-24 DIAGNOSIS — M159 Polyosteoarthritis, unspecified: Secondary | ICD-10-CM | POA: Diagnosis not present

## 2023-06-24 DIAGNOSIS — Z87891 Personal history of nicotine dependence: Secondary | ICD-10-CM | POA: Diagnosis not present

## 2023-06-24 DIAGNOSIS — E538 Deficiency of other specified B group vitamins: Secondary | ICD-10-CM | POA: Diagnosis not present

## 2023-06-24 DIAGNOSIS — Z79899 Other long term (current) drug therapy: Secondary | ICD-10-CM | POA: Diagnosis not present

## 2023-06-24 DIAGNOSIS — Z96641 Presence of right artificial hip joint: Secondary | ICD-10-CM | POA: Diagnosis not present

## 2023-06-24 DIAGNOSIS — I708 Atherosclerosis of other arteries: Secondary | ICD-10-CM | POA: Diagnosis not present

## 2023-06-24 DIAGNOSIS — E559 Vitamin D deficiency, unspecified: Secondary | ICD-10-CM | POA: Diagnosis not present

## 2023-06-24 DIAGNOSIS — K219 Gastro-esophageal reflux disease without esophagitis: Secondary | ICD-10-CM | POA: Diagnosis not present

## 2023-06-24 DIAGNOSIS — I10 Essential (primary) hypertension: Secondary | ICD-10-CM | POA: Diagnosis not present

## 2023-06-24 DIAGNOSIS — E66812 Obesity, class 2: Secondary | ICD-10-CM | POA: Diagnosis not present

## 2023-06-24 DIAGNOSIS — J4489 Other specified chronic obstructive pulmonary disease: Secondary | ICD-10-CM | POA: Diagnosis not present

## 2023-06-24 DIAGNOSIS — K6389 Other specified diseases of intestine: Secondary | ICD-10-CM | POA: Diagnosis not present

## 2023-06-24 DIAGNOSIS — E7849 Other hyperlipidemia: Secondary | ICD-10-CM | POA: Diagnosis not present

## 2023-06-24 DIAGNOSIS — Z9884 Bariatric surgery status: Secondary | ICD-10-CM | POA: Diagnosis not present

## 2023-06-25 NOTE — Progress Notes (Signed)
GU Location of Tumor / Histology: Prostate Ca  PSA is (5.73 on 12/06/2022)  Open Prostatectomy (2007)  01/01/2023 Dr. Jettie Pagan NM PET (PSMA) Skull to Mid Thigh CLINICAL DATA:  Prostate carcinoma with biochemical recurrence. Post prostatectomy 20 years prior. Rising PSA of 5.73.  IMPRESSION: 1. No evidence local prostate cancer recurrence in the prostatectomy bed. 2. No evidence metastatic adenopathy in the pelvis or periaortic retroperitoneum. 3. No evidence of visceral metastasis or skeletal metastasis. 4. 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  ADDENDUM REPORT: 02/11/2023 13:40   ADDENDUM: Focus of activity inferior to the bladder just RIGHT of midline in the anterior aspect of prostatic fossa has intense radiotracer activity (image 195). Activity is equal intensity urine in the bladder.   Differential would include urine within the prostatic urethra versus local recurrence of prostate adenocarcinoma.    Past/Anticipated interventions by urology, if any: NA  Past/Anticipated interventions by medical oncology, if any: NA  Weight changes, if any: {:18581}  IPSS: SHIM:  Bowel/Bladder complaints, if any: {:18581}   Nausea/Vomiting, if any: {:18581}  Pain issues, if any:  {:18581}  SAFETY ISSUES: Prior radiation? {:18581} Pacemaker/ICD? {:18581} Possible current pregnancy? Male Is the patient on methotrexate? No  Current Complaints / other details:

## 2023-06-26 ENCOUNTER — Encounter: Payer: Self-pay | Admitting: Radiation Oncology

## 2023-06-26 ENCOUNTER — Other Ambulatory Visit: Payer: Self-pay

## 2023-06-26 ENCOUNTER — Ambulatory Visit
Admission: RE | Admit: 2023-06-26 | Discharge: 2023-06-26 | Disposition: A | Discharge status: Home / Self Care | Payer: Medicare Other | Source: Ambulatory Visit | Attending: Radiation Oncology | Admitting: Radiation Oncology

## 2023-06-26 DIAGNOSIS — C61 Malignant neoplasm of prostate: Secondary | ICD-10-CM

## 2023-06-26 HISTORY — DX: Elevated prostate specific antigen (PSA): R97.20

## 2023-06-26 NOTE — Progress Notes (Signed)
Radiation Oncology         [phone]) [phone] ________________________________  Follow-up outpatient visit via telephone  Name: Travis Blanchard MRN: [id number]  Date: 06/26/2023  DOB: August 27, 1945  XB:[id number], Travis Salinas, MD  Gaynelle Adu, MD   REFERRING PHYSICIAN: Gaynelle Adu, MD  DIAGNOSIS: 77 y.o. 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 Malignant neoplasm of prostate (HCC)  C61       HISTORY OF PRESENT ILLNESS: Travis Blanchard is a 77 y.o. male who was originally diagnosed with prostate cancer and underwent an open prostatectomy in 2007 under the care of Dr. Annabell Howells. We have reached out to [facility], but they do not have his office or hospital records available and he was lost to subsequent follow up.   Most recently, he presented to Dr. Cardell Peach on 12/06/22 for right testicular pain. Dr. Cardell Peach recommended follow-up PSA which was elevated at 5.73.  He underwent PSMA PET scan on 01/01/23 which showed some uptake beneath the bladder. It is unclear if this is activity in the urethra or evidence of local recurrence. Fortunately, no evidence of metastatic disease was seen.  He was seen in the radiation clinic on 02/06/2023 to discuss potential salvage radiation to the prostate fossa and pelvic lymph nodes concurrent with ADT.  He had a 47-month Eligard injection in the urology office on 02/19/2023 in preparation to proceed with treatment but in the interim, he was found to have a 3.2 cm soft tissue mass at the hepatic flexure on virtual colonoscopy given his previous history of polyps and ventral hernia.  He subsequently underwent laparoscopic repair of an incarcerated incisional hernia with lysis of adhesions and mucosal resection on 05/21/2023 under the care of Dr. Meridee Score and Dr. Andrey Campanile.  Final surgical pathology confirmed focal, superficial invasive adenocarcinoma in the polyp removed from the descending colon. Per Dr.  Meridee Score, the consensus is that curative intent was achieved surgically and the plan is to follow-up in 3 to 4 months.    He was cleared by GI to proceed with the planned salvage radiotherapy.  Therefore, the patient has kindly been referred back to [name] today for a follow-up discussion regarding proceeding with the salvage radiotherapy to the prostate fossa/pelvic lymph nodes.   PREVIOUS RADIATION THERAPY: No  PAST MEDICAL HISTORY:  Past Medical History:  Diagnosis Date   B12 deficiency    Biochemically recurrent malignant neoplasm of prostate [facility]) 2007   urologist--- dr gay/  radiation onologist--- dr Kathrynn Running;  2007  s/p open prostatectomy;   local recurrent prostated cancer w/ PSA 5.73   COPD with asthma (HCC)    Diet-controlled type 2 diabetes mellitus (HCC)    followed by pcp;   (03-28-2023   per pt checks blood daily in am , not fasting)   GERD (gastroesophageal reflux disease)    History of adenomatous polyp of colon    History of Helicobacter pylori infection    EGD done @ [facility] by dr reed chronic h. 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 Andrey Campanile (general surgeon)   Ventral incisional hernia  Vitamin D deficiency    Wears glasses       PAST SURGICAL HISTORY: Past Surgical History:  Procedure Laterality Date   COLONOSCOPY  08/01/2017   @[facility]  by dr reed   COLONOSCOPY N/A 05/21/2023   Procedure: COLONOSCOPY;  Surgeon: Meridee Score Netty Starring., MD;  Location: [facility];  Service: Gastroenterology;  Laterality: N/A;   COLONOSCOPY WITH PROPOFOL N/A 07/18/2017   Procedure: COLONOSCOPY WITH PROPOFOL;  Surgeon: Corbin Ade, MD;  Location: [facility];  Service: Endoscopy;  Laterality: N/A;   8:45   ENDOSCOPIC MUCOSAL RESECTION  05/21/2023   Procedure: ENDOSCOPIC MUCOSAL RESECTION;  Surgeon: Meridee Score, Netty Starring., MD;  Location: [facility];  Service: Gastroenterology;;   ESOPHAGOGASTRODUODENOSCOPY     HEMORRHOID SURGERY  1974   HEMOSTASIS CLIP PLACEMENT  05/21/2023   Procedure: HEMOSTASIS CLIP PLACEMENT;  Surgeon: Lemar Lofty., MD;  Location: [facility];  Service: Gastroenterology;;   HEMOSTASIS CONTROL  05/21/2023   Procedure: HEMOSTASIS CONTROL;  Surgeon: Lemar Lofty., MD;  Location: [facility];  Service: Gastroenterology;;   KNEE ARTHROSCOPY Right    1985;  1990   LAPAROSCOPIC GASTRIC SLEEVE RESECTION N/A 03/06/2022   Procedure: LAPAROSCOPIC SLEEVE GASTRECTOMY;  Surgeon: Gaynelle Adu, MD;  Location: [facility];  Service: General;  Laterality: N/A;   LAPAROSCOPIC PARTIAL COLECTOMY N/A 05/21/2023   Procedure: LAPAROSCOPIC ASSISTED REPAIR OF INCARCERATED INCISIONAL HERNIA; LYSIS OF ADHESIONS;  Surgeon: Gaynelle Adu, MD;  Location: [facility];  Service: General;  Laterality: N/A;   POLYPECTOMY  05/21/2023   Procedure: POLYPECTOMY;  Surgeon: Lemar Lofty., MD;  Location: [facility];  Service: Gastroenterology;;   PROSTATECTOMY  2007   open   SUBMUCOSAL LIFTING INJECTION  05/21/2023   Procedure: SUBMUCOSAL LIFTING INJECTION;  Surgeon: Lemar Lofty., MD;  Location: [facility];  Service: Gastroenterology;;   SUBMUCOSAL TATTOO INJECTION  05/21/2023   Procedure: SUBMUCOSAL TATTOO INJECTION;  Surgeon: Lemar Lofty., MD;  Location: [facility];  Service: Gastroenterology;;   TOTAL HIP ARTHROPLASTY Right 08/21/2022   Procedure: TOTAL HIP ARTHROPLASTY ANTERIOR APPROACH;  Surgeon: Durene Romans, MD;  Location: [facility];  Service: Orthopedics;  Laterality: Right;   UPPER GI ENDOSCOPY N/A 03/06/2022   Procedure: UPPER GI ENDOSCOPY;  Surgeon: Gaynelle Adu, MD;  Location: [facility];  Service: General;  Laterality: N/A;    FAMILY HISTORY:  Family History  Problem Relation  Age of Onset   Diabetes Mother    Hypertension Mother    Hyperlipidemia Mother    Stroke Mother    Heart disease Mother    Depression Mother    Obesity Mother    Eating disorder Mother    Cancer Father    Depression Father    Anxiety disorder Father    Liver disease Father    Alcoholism Father    Pancreatic cancer Father    Cancer Sister        Breast   Diabetes Sister    Colon cancer Neg Hx    Esophageal cancer Neg Hx    Inflammatory bowel disease Neg Hx    Rectal cancer Neg Hx    Stomach cancer Neg Hx     SOCIAL HISTORY:  Social History   Socioeconomic History   Marital status: Married    Spouse name: Zashawn Conkey   Number of children: Not on file   Years of education: Not on file   Highest education level: Not on file  Occupational History   Occupation: Bailbondsman  Tobacco Use   Smoking status:  Former    Current packs/day: 0.00    Average packs/day: 1 pack/day for 25.0 years (25.0 ttl pk-yrs)    Types: Cigarettes    Start date: 03/11/1977    Quit date: 03/11/2002    Years since quitting: 21.3   Smokeless tobacco: Never   Tobacco comments:    03-28-2023  per pt quit smoking 2003 ,  smoked started age 72  Vaping Use   Vaping status: Never Used  Substance and Sexual Activity   Alcohol use: Yes    Comment: occasional   Drug use: Never   Sexual activity: Not on file  Other Topics Concern   Not on file  Social History Narrative   Lives with wife.     Social Drivers of Corporate investment banker Strain: Not on file  Food Insecurity: No Food Insecurity (06/26/2023)   Hunger Vital Sign    Worried About Running Out of Food in the Last Year: Never true    Ran Out of Food in the Last Year: Never true  Transportation Needs: No Transportation Needs (06/26/2023)   PRAPARE - Administrator, Civil Service (Medical): No    Lack of Transportation (Non-Medical): No  Physical Activity: Not on file  Stress: Not on file  Social Connections: Not on  file  Intimate Partner Violence: Not At Risk (06/26/2023)   Humiliation, Afraid, Rape, and Kick questionnaire    Fear of Current or Ex-Partner: No    Emotionally Abused: No    Physically Abused: No    Sexually Abused: No    ALLERGIES: Lisinopril and Penicillins  MEDICATIONS:  Current Outpatient Medications  Medication Sig Dispense Refill   acetaminophen (TYLENOL) 650 MG CR tablet Take 650 mg by mouth every 8 (eight) hours as needed for pain.     amLODipine (NORVASC) 5 MG tablet Take 5 mg by mouth daily.     atorvastatin (LIPITOR) 20 MG tablet Take 20 mg by mouth daily.      CALCIUM-VITAMIN D PO Take 1 tablet by mouth in the morning and at bedtime.     Cholecalciferol (VITAMIN D3) 1000 units CAPS Take 1,000 Units by mouth daily.     loratadine (CLARITIN) 10 MG tablet Take 10 mg by mouth daily.     Multiple Vitamins-Minerals (BARIATRIC MULTIVITAMINS/IRON PO) Take 1 capsule by mouth 3 (three) times daily.     omeprazole (PRILOSEC) 20 MG capsule Take 20 mg by mouth daily as needed (acid reflux).     TURMERIC PO Take 1,000 mg by mouth 2 (two) times daily.     vitamin B-12 (CYANOCOBALAMIN) 1000 MCG tablet Take 1,000 mcg by mouth daily.     zolpidem (AMBIEN) 10 MG tablet Take 10 mg by mouth at bedtime.     albuterol (VENTOLIN HFA) 108 (90 Base) MCG/ACT inhaler Inhale 1 puff into the lungs every 6 (six) hours as needed for wheezing or shortness of breath. 1 Inhaler 0   fluticasone (FLONASE) 50 MCG/ACT nasal spray Place 1 spray into both nostrils daily as needed for allergies.     hydrocortisone (ANUSOL-HC) 25 MG suppository Place 1 suppository (25 mg total) rectally 2 (two) times daily. 12 suppository 0   metroNIDAZOLE (FLAGYL) 500 MG tablet PLEASE SEE ATTACHED FOR DETAILED DIRECTIONS     Na Sulfate-K Sulfate-Mg Sulf 17.5-3.13-1.6 GM/177ML SOLN See admin instructions.     neomycin (MYCIFRADIN) 500 MG tablet PLEASE SEE ATTACHED FOR DETAILED DIRECTIONS     oxyCODONE (OXY IR/ROXICODONE) 5 MG  immediate release  tablet Take 1 tablet (5 mg total) by mouth every 6 (six) hours as needed for severe pain (pain score 7-10). 20 tablet 0   No current facility-administered medications for this encounter.    REVIEW OF SYSTEMS:  On review of systems, the patient reports that he is doing well overall. He denies any chest pain, shortness of breath, cough, fevers, chills, night sweats, unintended weight changes. He denies any bowel disturbances, and denies abdominal pain, nausea or vomiting. He denies any new musculoskeletal or joint aches or pains. His IPSS was 6, indicating mild urinary symptoms.  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 A complete review of systems is obtained and is otherwise negative.   PHYSICAL EXAM:  Wt Readings from Last 3 Encounters:  05/28/23 257 lb (116.6 kg)  05/21/23 257 lb (116.6 kg)  05/09/23 257 lb (116.6 kg)   Temp Readings from Last 3 Encounters:  05/28/23 98.4 F (36.9 C) (Oral)  05/23/23 99.3 F (37.4 C) (Oral)  05/09/23 99 F (37.2 C) (Oral)   BP Readings from Last 3 Encounters:  05/28/23 123/71  05/23/23 119/71  05/09/23 131/82   Pulse Readings from Last 3 Encounters:  05/28/23 71  05/23/23 82  05/09/23 82   Pain Assessment Pain Score: 3  Pain Loc: Hip (right hip/knee)/10  Unable to assess due to telephone follow-up visit format.  KPS = 100  100 - Normal; no complaints; no evidence of disease. 90   - Able to carry on normal activity; minor signs or symptoms of disease. 80   - Normal activity with effort; some signs or symptoms of disease. 73   - Cares for self; unable to carry on normal activity or to do active work. 60   - Requires occasional assistance, but is able to care for most of his personal needs. 50   - Requires considerable assistance and frequent medical care. 40   - Disabled; requires special care and assistance. 30   - Severely disabled; hospital admission is indicated although death  not imminent. 20   - Very sick; hospital admission necessary; active supportive treatment necessary. 10   - Moribund; fatal processes progressing rapidly. 0     - Dead  Karnofsky DA, Abelmann WH, Craver LS and Burchenal JH [phone]) The use of the nitrogen mustards in the palliative treatment of carcinoma: with particular reference to bronchogenic carcinoma Cancer 1 634-56  LABORATORY DATA:  Lab Results  Component Value Date   WBC 8.3 05/28/2023   HGB 11.1 (L) 05/28/2023   HCT 35.0 (L) 05/28/2023   MCV 93.3 05/28/2023   PLT 230 05/28/2023   Lab Results  Component Value Date   NA 139 05/28/2023   K 3.9 05/28/2023   CL 106 05/28/2023   CO2 24 05/28/2023   Lab Results  Component Value Date   ALT 30 05/28/2023   AST 28 05/28/2023   ALKPHOS 80 05/28/2023   BILITOT 0.6 05/28/2023     RADIOGRAPHY: CT ABDOMEN PELVIS W CONTRAST Result Date: 05/28/2023 CLINICAL DATA:  77 year old male with left side chest pain. Abdominal pain. History of prostate cancer. Also postoperative day seven status post laparoscopic assisted repair of incarcerated ventral abdominal hernia. EXAM: CT ABDOMEN AND PELVIS WITH CONTRAST TECHNIQUE: Multidetector CT imaging of the abdomen and pelvis was performed using the standard protocol following bolus administration of intravenous contrast. RADIATION DOSE REDUCTION: This exam was performed according to the departmental dose-optimization program which includes automated exposure control,  adjustment of the mA and/or kV according to patient size and/or use of iterative reconstruction technique. CONTRAST:  75mL OMNIPAQUE IOHEXOL 350 MG/ML SOLN COMPARISON:  CTA chest today. Previous noncontrast CT Abdomen and Pelvis 03/19/2023. PET-CT 01/01/2023. FINDINGS: Lower chest: Curvilinear and platelike lung base atelectasis described on the CTA today separately. Hepatobiliary: Negative liver and gallbladder. Pancreas: Negative. Spleen: Negative. Adrenals/Urinary Tract: Negative adrenal  glands. Nonobstructed kidneys with symmetric renal enhancement and contrast excretion. Nondilated ureters. Diminutive bladder which is mostly obscured from hip arthroplasty streak artifact. Stomach/Bowel: Recent postoperative changes to the ventral abdominal wall including a mildly complex, slightly serpiginous air and fluid collection (series 2, image 49) encompassing 30 x 88 x 48 mm (AP by transverse by CC) is in the subcutaneous fat. Surrounding soft tissue inflammation, stranding. No other abdominal wall gas or fluid identified. Decompressed large bowel from the level of a distal splenic flexure anastomosis into the pelvis. Upstream redundant splenic flexure also mostly decompressed. Retained gas and stool in the transverse colon which is redundant. Mid transverse directly underlies the abnormal ventral abdominal wall. No residual herniation is evident. No large bowel wall thickening. Normal retrocecal appendix series 2, image 44. Fluid in the terminal ileum but no dilated small bowel in the abdomen or pelvis. Chronic postoperative changes to the stomach, perhaps gastric sleeve and stable since September. Stomach and duodenum fairly decompressed. No free air or free fluid in the peritoneal cavity. Pre-existing bowel and mesentery containing ventral abdominal hernia most recently seen on CT 03/19/2023 has been reduced and no definite peritoneal cavity defect is identified now. Vascular/Lymphatic: Aortoiliac calcified atherosclerosis. Normal caliber abdominal aorta. Major arterial structures remain patent. Portal venous system appears to be patent. No lymphadenopathy identified. Reproductive: Hip arthroplasty streak artifact. Evidence of previous prostatectomy. Other: Hip arthroplasty streak artifact in the pelvis. No definite pelvis free fluid. Musculoskeletal: Lower thoracic interbody ankylosis from flowing endplate osteophytes. Levels of advanced lumbar disc degeneration vacuum disc as well as similar interbody  ankylosis including at the lumbosacral junction. Right total hip arthroplasty is chronic. No acute or suspicious osseous lesion identified. IMPRESSION: 1. Recent postoperative changes from surgical repair of large ventral abdominal hernia. Ventral abdominal wall subcutaneous air and fluid collection (approximate volume 60 mL, series 2, image 50)) is nonspecific and might be seroma or developing abscess. Regional abdominal wall inflammation versus contusion. But no residual or recurrent hernia identified. 2. No other acute or inflammatory process identified in the abdomen or pelvis. CTA Chest reported separately. 3.  Aortic Atherosclerosis (ICD10-I70.0). Electronically Signed   By: Odessa Fleming M.D.   On: 05/28/2023 14:44   CT Angio Chest PE W/Cm &/Or Wo Cm Result Date: 05/28/2023 CLINICAL DATA:  77 year old male with left side chest pain. Abdominal pain. EXAM: CT ANGIOGRAPHY CHEST WITH CONTRAST TECHNIQUE: Multidetector CT imaging of the chest was performed using the standard protocol during bolus administration of intravenous contrast. Multiplanar CT image reconstructions and MIPs were obtained to evaluate the vascular anatomy. RADIATION DOSE REDUCTION: This exam was performed according to the departmental dose-optimization program which includes automated exposure control, adjustment of the mA and/or kV according to patient size and/or use of iterative reconstruction technique. CONTRAST:  75mL OMNIPAQUE IOHEXOL 350 MG/ML SOLN COMPARISON:  Portable chest x-ray 1014 hours today. CT Abdomen and Pelvis reported separately. FINDINGS: Cardiovascular: Adequate contrast bolus timing in the pulmonary arterial tree. No pulmonary artery filling defect identified. Calcified coronary artery atherosclerosis series 10, image 186. Mild for age Calcified aortic atherosclerosis. Heart size within normal limits.  No pericardial effusion. Mediastinum/Nodes: Negative for mediastinal mass or lymphadenopathy. Lungs/Pleura: Centrilobular  emphysema. Somewhat low lung volumes. Major airways are patent with some atelectatic change. Platelike and dependent atelectasis in both lower lobes is symmetric. No pleural effusion. No convincing pulmonary inflammation. Upper Abdomen: CT Abdomen and Pelvis reported separately. Musculoskeletal: No acute osseous abnormality identified. Review of the MIP images confirms the above findings. IMPRESSION: 1. Negative for acute pulmonary embolus. 2. Low lung volumes with lower lobe atelectasis. 3. Aortic Atherosclerosis (ICD10-I70.0) and Emphysema (ICD10-J43.9). Electronically Signed   By: Odessa Fleming M.D.   On: 05/28/2023 14:36   DG Chest Port 1 View Result Date: 05/28/2023 CLINICAL DATA:  77 year old male with left side chest pain. EXAM: PORTABLE CHEST 1 VIEW COMPARISON:  Chest radiograph 12/18/2018. FINDINGS: Portable AP semi upright view at 1014 hours. Lower lung volumes. Mild tortuosity of the thoracic aorta and other mediastinal contours appear stable. Visualized tracheal air column is within normal limits. Allowing for portable technique the lungs are clear. No pneumothorax. No acute osseous abnormality identified. IMPRESSION: Lower lung volumes.  No acute cardiopulmonary abnormality. Electronically Signed   By: Odessa Fleming M.D.   On: 05/28/2023 14:24   We personally reviewed his images.     IMPRESSION/PLAN: I spoke with the patient to conduct his scheduled follow up visit via telephone to spare the patient unnecessary potential exposure in the healthcare setting during the current COVID-19 pandemic.  The patient was notified in advance and gave permission to proceed with this visit format.  1. 77 y.o. 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  We discussed the natural history of prostate cancer.  We unfortunately do not have his records for his previous prostate cancer.  PSMA PET imaging shows likely  disease recurrence just right of midline in the anterior aspect of the prostatic fossa versus urine within the prostatic urethra. We had previously reviewed some of the evidence suggesting an advantage for patients with detectable, rising postoperative PSA, who undergo early salvage therapy and discussed radiation treatment directed to the prostatic fossa with regard to the logistics and delivery of a 7.5 week course of daily external beam radiotherapy.  We reviewed this again today along with the anticipated acute and long-term side effects associated with this treatment.  He was encouraged to ask questions that were answered to his stated satisfaction.  At the conclusion of our conversation, the patient is interested in moving forward with 7.5 weeks of salvage external beam therapy concurrent with ADT. He has already started ADT with a 47-month Lupron injection on 02/19/2023.  The patient appears to have a good understanding of his disease and our treatment recommendations which are of curative intent and is in agreement with the stated plan. Therefore, we will share our discussion with Dr. Cardell Peach and move forward with treatment planning accordingly. He is tentatively scheduled for CT simulation at 1 PM on 06/27/2023, in anticipation of beginning IMRT in the near future.   We enjoyed meeting with him again today and look forward to continuing to participate in his care.  We personally spent 60 minutes in this encounter including chart review, reviewing radiological studies, telephone conversation with the patient, entering orders, coordinating care and completing documentation.   Marguarite Arbour, PA-C    Margaretmary Dys, MD  [facility]  Radiation Oncology Direct Dial: [phone]  Fax: [phone] .com  Skype  LinkedIn

## 2023-06-27 ENCOUNTER — Ambulatory Visit
Admission: RE | Admit: 2023-06-27 | Discharge: 2023-06-27 | Disposition: A | Discharge status: Home / Self Care | Payer: Medicare Other | Source: Ambulatory Visit | Attending: Radiation Oncology | Admitting: Radiation Oncology

## 2023-06-27 DIAGNOSIS — K6389 Other specified diseases of intestine: Secondary | ICD-10-CM | POA: Diagnosis not present

## 2023-06-27 DIAGNOSIS — M159 Polyosteoarthritis, unspecified: Secondary | ICD-10-CM | POA: Diagnosis not present

## 2023-06-27 DIAGNOSIS — Z48815 Encounter for surgical aftercare following surgery on the digestive system: Secondary | ICD-10-CM | POA: Diagnosis not present

## 2023-06-27 DIAGNOSIS — Z191 Hormone sensitive malignancy status: Secondary | ICD-10-CM | POA: Diagnosis not present

## 2023-06-27 DIAGNOSIS — I10 Essential (primary) hypertension: Secondary | ICD-10-CM | POA: Diagnosis not present

## 2023-06-27 DIAGNOSIS — C61 Malignant neoplasm of prostate: Secondary | ICD-10-CM | POA: Diagnosis not present

## 2023-06-27 DIAGNOSIS — E119 Type 2 diabetes mellitus without complications: Secondary | ICD-10-CM | POA: Diagnosis not present

## 2023-06-27 DIAGNOSIS — E66812 Obesity, class 2: Secondary | ICD-10-CM | POA: Diagnosis not present

## 2023-06-27 NOTE — Progress Notes (Signed)
  Radiation Oncology         [phone] ________________________________  Name: Travis Blanchard MRN: [id number]  Date: 06/27/2023  DOB: [birth year]  SIMULATION AND TREATMENT PLANNING NOTE    ICD-10-CM   1. Malignant neoplasm of prostate (HCC)  C61       DIAGNOSIS:  77 y.o. 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  Identity was confirmed.  All relevant records and images related to the planned course of therapy were reviewed.  The patient freely provided informed written consent to proceed with treatment after reviewing the details related to the planned course of therapy. The consent form was witnessed and verified by the simulation staff.  Then, the patient was set-up in a stable reproducible supine position for radiation therapy.  A vacuum lock pillow device was custom fabricated to position his legs in a reproducible immobilized position.  Then, I performed a urethrogram under sterile conditions to identify the prostatic apex.  CT images were obtained.  Surface markings were placed.  The CT images were loaded into the planning software.  Then the prostate target and avoidance structures including the rectum, bladder, bowel and hips were contoured.  Treatment planning then occurred.  The radiation prescription was entered and confirmed.  A total of one complex treatment devices was fabricated. I have requested : Intensity Modulated Radiotherapy (IMRT) is medically necessary for this case for the following reason:  Rectal sparing.[name]  PLAN:   The prostate fossa and pelvic lymph nodes will initially be treated to 45 Gy in 25 fractions of 1.8 Gy followed by a boost to the fossa only, to 68.4 Gy with 13 additional fractions of 1.8 Gy   ________________________________  Artist Pais Kathrynn Running, M.D.

## 2023-07-01 DIAGNOSIS — Z48815 Encounter for surgical aftercare following surgery on the digestive system: Secondary | ICD-10-CM | POA: Diagnosis not present

## 2023-07-01 DIAGNOSIS — E66812 Obesity, class 2: Secondary | ICD-10-CM | POA: Diagnosis not present

## 2023-07-01 DIAGNOSIS — I10 Essential (primary) hypertension: Secondary | ICD-10-CM | POA: Diagnosis not present

## 2023-07-01 DIAGNOSIS — M159 Polyosteoarthritis, unspecified: Secondary | ICD-10-CM | POA: Diagnosis not present

## 2023-07-01 DIAGNOSIS — E119 Type 2 diabetes mellitus without complications: Secondary | ICD-10-CM | POA: Diagnosis not present

## 2023-07-01 DIAGNOSIS — K6389 Other specified diseases of intestine: Secondary | ICD-10-CM | POA: Diagnosis not present

## 2023-07-04 DIAGNOSIS — K6389 Other specified diseases of intestine: Secondary | ICD-10-CM | POA: Diagnosis not present

## 2023-07-04 DIAGNOSIS — E66812 Obesity, class 2: Secondary | ICD-10-CM | POA: Diagnosis not present

## 2023-07-04 DIAGNOSIS — Z48815 Encounter for surgical aftercare following surgery on the digestive system: Secondary | ICD-10-CM | POA: Diagnosis not present

## 2023-07-04 DIAGNOSIS — M159 Polyosteoarthritis, unspecified: Secondary | ICD-10-CM | POA: Diagnosis not present

## 2023-07-04 DIAGNOSIS — I10 Essential (primary) hypertension: Secondary | ICD-10-CM | POA: Diagnosis not present

## 2023-07-04 DIAGNOSIS — E119 Type 2 diabetes mellitus without complications: Secondary | ICD-10-CM | POA: Diagnosis not present

## 2023-07-05 DIAGNOSIS — E118 Type 2 diabetes mellitus with unspecified complications: Secondary | ICD-10-CM | POA: Diagnosis not present

## 2023-07-05 DIAGNOSIS — I10 Essential (primary) hypertension: Secondary | ICD-10-CM | POA: Diagnosis not present

## 2023-07-08 DIAGNOSIS — C61 Malignant neoplasm of prostate: Secondary | ICD-10-CM | POA: Diagnosis not present

## 2023-07-08 DIAGNOSIS — Z191 Hormone sensitive malignancy status: Secondary | ICD-10-CM | POA: Diagnosis not present

## 2023-07-09 DIAGNOSIS — M159 Polyosteoarthritis, unspecified: Secondary | ICD-10-CM | POA: Diagnosis not present

## 2023-07-09 DIAGNOSIS — K6389 Other specified diseases of intestine: Secondary | ICD-10-CM | POA: Diagnosis not present

## 2023-07-09 DIAGNOSIS — E66812 Obesity, class 2: Secondary | ICD-10-CM | POA: Diagnosis not present

## 2023-07-09 DIAGNOSIS — I10 Essential (primary) hypertension: Secondary | ICD-10-CM | POA: Diagnosis not present

## 2023-07-09 DIAGNOSIS — Z48815 Encounter for surgical aftercare following surgery on the digestive system: Secondary | ICD-10-CM | POA: Diagnosis not present

## 2023-07-09 DIAGNOSIS — E119 Type 2 diabetes mellitus without complications: Secondary | ICD-10-CM | POA: Diagnosis not present

## 2023-07-15 ENCOUNTER — Ambulatory Visit
Admission: RE | Admit: 2023-07-15 | Discharge: 2023-07-15 | Disposition: A | Discharge status: Home / Self Care | Payer: Medicare Other | Source: Ambulatory Visit | Attending: Radiation Oncology | Admitting: Radiation Oncology

## 2023-07-15 ENCOUNTER — Other Ambulatory Visit: Payer: Self-pay

## 2023-07-15 DIAGNOSIS — Z191 Hormone sensitive malignancy status: Secondary | ICD-10-CM | POA: Diagnosis not present

## 2023-07-15 DIAGNOSIS — C61 Malignant neoplasm of prostate: Secondary | ICD-10-CM | POA: Diagnosis not present

## 2023-07-15 DIAGNOSIS — Z510 Encounter for antineoplastic radiation therapy: Secondary | ICD-10-CM | POA: Diagnosis not present

## 2023-07-15 LAB — RAD ONC ARIA SESSION SUMMARY
Course Elapsed Days: 0
Plan Fractions Treated to Date: 1
Plan Prescribed Dose Per Fraction: 1.8 Gy
Plan Total Fractions Prescribed: 25
Plan Total Prescribed Dose: 45 Gy
Reference Point Dosage Given to Date: 1.8 Gy
Reference Point Session Dosage Given: 1.8 Gy
Session Number: 1

## 2023-07-16 ENCOUNTER — Other Ambulatory Visit: Payer: Self-pay

## 2023-07-16 ENCOUNTER — Ambulatory Visit
Admission: RE | Admit: 2023-07-16 | Discharge: 2023-07-16 | Disposition: A | Discharge status: Home / Self Care | Payer: Medicare Other | Source: Ambulatory Visit | Attending: Radiation Oncology | Admitting: Radiation Oncology

## 2023-07-16 DIAGNOSIS — Z510 Encounter for antineoplastic radiation therapy: Secondary | ICD-10-CM | POA: Diagnosis not present

## 2023-07-16 DIAGNOSIS — Z191 Hormone sensitive malignancy status: Secondary | ICD-10-CM | POA: Diagnosis not present

## 2023-07-16 DIAGNOSIS — C61 Malignant neoplasm of prostate: Secondary | ICD-10-CM | POA: Diagnosis not present

## 2023-07-16 LAB — RAD ONC ARIA SESSION SUMMARY
Course Elapsed Days: 1
Plan Fractions Treated to Date: 2
Plan Prescribed Dose Per Fraction: 1.8 Gy
Plan Total Fractions Prescribed: 25
Plan Total Prescribed Dose: 45 Gy
Reference Point Dosage Given to Date: 3.6 Gy
Reference Point Session Dosage Given: 1.8 Gy
Session Number: 2

## 2023-07-17 ENCOUNTER — Ambulatory Visit
Admission: RE | Admit: 2023-07-17 | Discharge: 2023-07-17 | Disposition: A | Discharge status: Home / Self Care | Payer: Medicare Other | Source: Ambulatory Visit | Attending: Radiation Oncology | Admitting: Radiation Oncology

## 2023-07-17 ENCOUNTER — Other Ambulatory Visit: Payer: Self-pay

## 2023-07-17 DIAGNOSIS — Z191 Hormone sensitive malignancy status: Secondary | ICD-10-CM | POA: Diagnosis not present

## 2023-07-17 DIAGNOSIS — Z510 Encounter for antineoplastic radiation therapy: Secondary | ICD-10-CM | POA: Diagnosis not present

## 2023-07-17 DIAGNOSIS — C61 Malignant neoplasm of prostate: Secondary | ICD-10-CM | POA: Diagnosis not present

## 2023-07-17 LAB — RAD ONC ARIA SESSION SUMMARY
Course Elapsed Days: 2
Plan Fractions Treated to Date: 3
Plan Prescribed Dose Per Fraction: 1.8 Gy
Plan Total Fractions Prescribed: 25
Plan Total Prescribed Dose: 45 Gy
Reference Point Dosage Given to Date: 5.4 Gy
Reference Point Session Dosage Given: 1.8 Gy
Session Number: 3

## 2023-07-18 ENCOUNTER — Ambulatory Visit: Payer: Medicare Other

## 2023-07-19 ENCOUNTER — Ambulatory Visit
Admission: RE | Admit: 2023-07-19 | Discharge: 2023-07-19 | Disposition: A | Discharge status: Home / Self Care | Payer: Medicare Other | Source: Ambulatory Visit | Attending: Radiation Oncology | Admitting: Radiation Oncology

## 2023-07-19 ENCOUNTER — Other Ambulatory Visit: Payer: Self-pay

## 2023-07-19 ENCOUNTER — Ambulatory Visit: Payer: Medicare Other

## 2023-07-19 DIAGNOSIS — Z191 Hormone sensitive malignancy status: Secondary | ICD-10-CM | POA: Diagnosis not present

## 2023-07-19 DIAGNOSIS — Z510 Encounter for antineoplastic radiation therapy: Secondary | ICD-10-CM | POA: Diagnosis not present

## 2023-07-19 DIAGNOSIS — C61 Malignant neoplasm of prostate: Secondary | ICD-10-CM | POA: Diagnosis not present

## 2023-07-19 LAB — RAD ONC ARIA SESSION SUMMARY
Course Elapsed Days: 4
Plan Fractions Treated to Date: 4
Plan Prescribed Dose Per Fraction: 1.8 Gy
Plan Total Fractions Prescribed: 25
Plan Total Prescribed Dose: 45 Gy
Reference Point Dosage Given to Date: 7.2 Gy
Reference Point Session Dosage Given: 1.8 Gy
Session Number: 4

## 2023-07-22 ENCOUNTER — Ambulatory Visit: Payer: Medicare Other

## 2023-07-22 ENCOUNTER — Ambulatory Visit
Admission: RE | Admit: 2023-07-22 | Discharge: 2023-07-22 | Discharge status: Home / Self Care | Payer: Medicare Other | Source: Ambulatory Visit | Attending: Radiation Oncology

## 2023-07-22 ENCOUNTER — Other Ambulatory Visit: Payer: Self-pay

## 2023-07-22 ENCOUNTER — Ambulatory Visit
Admission: RE | Admit: 2023-07-22 | Discharge: 2023-07-22 | Disposition: A | Discharge status: Home / Self Care | Payer: Medicare Other | Source: Ambulatory Visit | Attending: Radiation Oncology

## 2023-07-22 DIAGNOSIS — C61 Malignant neoplasm of prostate: Secondary | ICD-10-CM | POA: Diagnosis not present

## 2023-07-22 DIAGNOSIS — Z510 Encounter for antineoplastic radiation therapy: Secondary | ICD-10-CM | POA: Diagnosis not present

## 2023-07-22 DIAGNOSIS — Z191 Hormone sensitive malignancy status: Secondary | ICD-10-CM | POA: Diagnosis not present

## 2023-07-22 LAB — RAD ONC ARIA SESSION SUMMARY
Course Elapsed Days: 7
Plan Fractions Treated to Date: 5
Plan Prescribed Dose Per Fraction: 1.8 Gy
Plan Total Fractions Prescribed: 25
Plan Total Prescribed Dose: 45 Gy
Reference Point Dosage Given to Date: 9 Gy
Reference Point Session Dosage Given: 1.8 Gy
Session Number: 5

## 2023-07-23 ENCOUNTER — Other Ambulatory Visit: Payer: Self-pay

## 2023-07-23 ENCOUNTER — Ambulatory Visit
Admission: RE | Admit: 2023-07-23 | Discharge: 2023-07-23 | Disposition: A | Discharge status: Home / Self Care | Payer: Medicare Other | Source: Ambulatory Visit | Attending: Radiation Oncology

## 2023-07-23 ENCOUNTER — Inpatient Hospital Stay: Payer: Medicare Other

## 2023-07-23 ENCOUNTER — Inpatient Hospital Stay: Payer: Medicare Other | Admitting: Genetic Counselor

## 2023-07-23 DIAGNOSIS — Z191 Hormone sensitive malignancy status: Secondary | ICD-10-CM | POA: Diagnosis not present

## 2023-07-23 DIAGNOSIS — Z510 Encounter for antineoplastic radiation therapy: Secondary | ICD-10-CM | POA: Diagnosis not present

## 2023-07-23 DIAGNOSIS — C61 Malignant neoplasm of prostate: Secondary | ICD-10-CM | POA: Diagnosis not present

## 2023-07-23 LAB — RAD ONC ARIA SESSION SUMMARY
Course Elapsed Days: 8
Plan Fractions Treated to Date: 6
Plan Prescribed Dose Per Fraction: 1.8 Gy
Plan Total Fractions Prescribed: 25
Plan Total Prescribed Dose: 45 Gy
Reference Point Dosage Given to Date: 10.8 Gy
Reference Point Session Dosage Given: 1.8 Gy
Session Number: 6

## 2023-07-24 ENCOUNTER — Other Ambulatory Visit: Payer: Self-pay

## 2023-07-24 ENCOUNTER — Ambulatory Visit
Admission: RE | Admit: 2023-07-24 | Discharge: 2023-07-24 | Disposition: A | Discharge status: Home / Self Care | Payer: Medicare Other | Source: Ambulatory Visit | Attending: Radiation Oncology

## 2023-07-24 DIAGNOSIS — C61 Malignant neoplasm of prostate: Secondary | ICD-10-CM | POA: Diagnosis not present

## 2023-07-24 DIAGNOSIS — Z191 Hormone sensitive malignancy status: Secondary | ICD-10-CM | POA: Diagnosis not present

## 2023-07-24 DIAGNOSIS — Z510 Encounter for antineoplastic radiation therapy: Secondary | ICD-10-CM | POA: Diagnosis not present

## 2023-07-24 LAB — RAD ONC ARIA SESSION SUMMARY
Course Elapsed Days: 9
Plan Fractions Treated to Date: 7
Plan Prescribed Dose Per Fraction: 1.8 Gy
Plan Total Fractions Prescribed: 25
Plan Total Prescribed Dose: 45 Gy
Reference Point Dosage Given to Date: 12.6 Gy
Reference Point Session Dosage Given: 1.8 Gy
Session Number: 7

## 2023-07-25 ENCOUNTER — Ambulatory Visit
Admission: RE | Admit: 2023-07-25 | Discharge: 2023-07-25 | Disposition: A | Discharge status: Home / Self Care | Payer: Medicare Other | Source: Ambulatory Visit | Attending: Radiation Oncology | Admitting: Radiation Oncology

## 2023-07-25 ENCOUNTER — Other Ambulatory Visit: Payer: Self-pay

## 2023-07-25 DIAGNOSIS — C61 Malignant neoplasm of prostate: Secondary | ICD-10-CM | POA: Diagnosis not present

## 2023-07-25 DIAGNOSIS — Z191 Hormone sensitive malignancy status: Secondary | ICD-10-CM | POA: Diagnosis not present

## 2023-07-25 DIAGNOSIS — Z510 Encounter for antineoplastic radiation therapy: Secondary | ICD-10-CM | POA: Diagnosis not present

## 2023-07-25 LAB — RAD ONC ARIA SESSION SUMMARY
Course Elapsed Days: 10
Plan Fractions Treated to Date: 8
Plan Prescribed Dose Per Fraction: 1.8 Gy
Plan Total Fractions Prescribed: 25
Plan Total Prescribed Dose: 45 Gy
Reference Point Dosage Given to Date: 14.4 Gy
Reference Point Session Dosage Given: 1.8 Gy
Session Number: 8

## 2023-07-26 ENCOUNTER — Other Ambulatory Visit: Payer: Self-pay

## 2023-07-26 ENCOUNTER — Ambulatory Visit
Admission: RE | Admit: 2023-07-26 | Discharge: 2023-07-26 | Disposition: A | Discharge status: Home / Self Care | Payer: Medicare Other | Source: Ambulatory Visit | Attending: Radiation Oncology

## 2023-07-26 ENCOUNTER — Ambulatory Visit
Admission: RE | Admit: 2023-07-26 | Discharge: 2023-07-26 | Disposition: A | Discharge status: Home / Self Care | Payer: Medicare Other | Source: Ambulatory Visit | Attending: Radiation Oncology | Admitting: Radiation Oncology

## 2023-07-26 DIAGNOSIS — Z510 Encounter for antineoplastic radiation therapy: Secondary | ICD-10-CM | POA: Diagnosis not present

## 2023-07-26 DIAGNOSIS — C61 Malignant neoplasm of prostate: Secondary | ICD-10-CM | POA: Diagnosis not present

## 2023-07-26 DIAGNOSIS — Z191 Hormone sensitive malignancy status: Secondary | ICD-10-CM | POA: Diagnosis not present

## 2023-07-26 LAB — RAD ONC ARIA SESSION SUMMARY
Course Elapsed Days: 11
Plan Fractions Treated to Date: 9
Plan Prescribed Dose Per Fraction: 1.8 Gy
Plan Total Fractions Prescribed: 25
Plan Total Prescribed Dose: 45 Gy
Reference Point Dosage Given to Date: 16.2 Gy
Reference Point Session Dosage Given: 1.8 Gy
Session Number: 9

## 2023-07-29 ENCOUNTER — Ambulatory Visit
Admission: RE | Admit: 2023-07-29 | Discharge: 2023-07-29 | Disposition: A | Discharge status: Home / Self Care | Payer: Medicare Other | Source: Ambulatory Visit | Attending: Radiation Oncology | Admitting: Radiation Oncology

## 2023-07-29 ENCOUNTER — Other Ambulatory Visit: Payer: Self-pay

## 2023-07-29 DIAGNOSIS — Z510 Encounter for antineoplastic radiation therapy: Secondary | ICD-10-CM | POA: Diagnosis not present

## 2023-07-29 DIAGNOSIS — Z191 Hormone sensitive malignancy status: Secondary | ICD-10-CM | POA: Diagnosis not present

## 2023-07-29 DIAGNOSIS — C61 Malignant neoplasm of prostate: Secondary | ICD-10-CM | POA: Diagnosis not present

## 2023-07-29 LAB — RAD ONC ARIA SESSION SUMMARY
Course Elapsed Days: 14
Plan Fractions Treated to Date: 10
Plan Prescribed Dose Per Fraction: 1.8 Gy
Plan Total Fractions Prescribed: 25
Plan Total Prescribed Dose: 45 Gy
Reference Point Dosage Given to Date: 18 Gy
Reference Point Session Dosage Given: 1.8 Gy
Session Number: 10

## 2023-07-30 ENCOUNTER — Other Ambulatory Visit: Payer: Self-pay

## 2023-07-30 ENCOUNTER — Ambulatory Visit
Admission: RE | Admit: 2023-07-30 | Discharge: 2023-07-30 | Disposition: A | Discharge status: Home / Self Care | Payer: Medicare Other | Source: Ambulatory Visit | Attending: Radiation Oncology | Admitting: Radiation Oncology

## 2023-07-30 DIAGNOSIS — Z191 Hormone sensitive malignancy status: Secondary | ICD-10-CM | POA: Diagnosis not present

## 2023-07-30 DIAGNOSIS — Z510 Encounter for antineoplastic radiation therapy: Secondary | ICD-10-CM | POA: Diagnosis not present

## 2023-07-30 DIAGNOSIS — C61 Malignant neoplasm of prostate: Secondary | ICD-10-CM | POA: Diagnosis not present

## 2023-07-30 LAB — RAD ONC ARIA SESSION SUMMARY
Course Elapsed Days: 15
Plan Fractions Treated to Date: 11
Plan Prescribed Dose Per Fraction: 1.8 Gy
Plan Total Fractions Prescribed: 25
Plan Total Prescribed Dose: 45 Gy
Reference Point Dosage Given to Date: 19.8 Gy
Reference Point Session Dosage Given: 1.8 Gy
Session Number: 11

## 2023-07-31 ENCOUNTER — Ambulatory Visit: Payer: Medicare Other

## 2023-08-01 ENCOUNTER — Ambulatory Visit: Payer: Medicare Other

## 2023-08-02 ENCOUNTER — Other Ambulatory Visit: Payer: Self-pay

## 2023-08-02 ENCOUNTER — Ambulatory Visit
Admission: RE | Admit: 2023-08-02 | Discharge: 2023-08-02 | Disposition: A | Discharge status: Home / Self Care | Payer: Medicare Other | Source: Ambulatory Visit | Attending: Radiation Oncology | Admitting: Radiation Oncology

## 2023-08-02 DIAGNOSIS — C61 Malignant neoplasm of prostate: Secondary | ICD-10-CM | POA: Diagnosis not present

## 2023-08-02 DIAGNOSIS — Z510 Encounter for antineoplastic radiation therapy: Secondary | ICD-10-CM | POA: Diagnosis not present

## 2023-08-02 DIAGNOSIS — Z191 Hormone sensitive malignancy status: Secondary | ICD-10-CM | POA: Diagnosis not present

## 2023-08-02 LAB — RAD ONC ARIA SESSION SUMMARY
Course Elapsed Days: 18
Plan Fractions Treated to Date: 12
Plan Prescribed Dose Per Fraction: 1.8 Gy
Plan Total Fractions Prescribed: 25
Plan Total Prescribed Dose: 45 Gy
Reference Point Dosage Given to Date: 21.6 Gy
Reference Point Session Dosage Given: 1.8 Gy
Session Number: 12

## 2023-08-05 ENCOUNTER — Ambulatory Visit
Admission: RE | Admit: 2023-08-05 | Discharge: 2023-08-05 | Disposition: A | Discharge status: Home / Self Care | Payer: Medicare Other | Source: Ambulatory Visit | Attending: Radiation Oncology | Admitting: Radiation Oncology

## 2023-08-05 ENCOUNTER — Other Ambulatory Visit: Payer: Self-pay

## 2023-08-05 DIAGNOSIS — Z510 Encounter for antineoplastic radiation therapy: Secondary | ICD-10-CM | POA: Diagnosis not present

## 2023-08-05 DIAGNOSIS — Z191 Hormone sensitive malignancy status: Secondary | ICD-10-CM | POA: Diagnosis not present

## 2023-08-05 DIAGNOSIS — I10 Essential (primary) hypertension: Secondary | ICD-10-CM | POA: Diagnosis not present

## 2023-08-05 DIAGNOSIS — E118 Type 2 diabetes mellitus with unspecified complications: Secondary | ICD-10-CM | POA: Diagnosis not present

## 2023-08-05 DIAGNOSIS — C61 Malignant neoplasm of prostate: Secondary | ICD-10-CM | POA: Diagnosis not present

## 2023-08-05 LAB — RAD ONC ARIA SESSION SUMMARY
Course Elapsed Days: 21
Plan Fractions Treated to Date: 13
Plan Prescribed Dose Per Fraction: 1.8 Gy
Plan Total Fractions Prescribed: 25
Plan Total Prescribed Dose: 45 Gy
Reference Point Dosage Given to Date: 23.4 Gy
Reference Point Session Dosage Given: 1.8 Gy
Session Number: 13

## 2023-08-06 ENCOUNTER — Ambulatory Visit
Admission: RE | Admit: 2023-08-06 | Discharge: 2023-08-06 | Disposition: A | Discharge status: Home / Self Care | Payer: Medicare Other | Source: Ambulatory Visit | Attending: Radiation Oncology | Admitting: Radiation Oncology

## 2023-08-06 ENCOUNTER — Other Ambulatory Visit: Payer: Self-pay

## 2023-08-06 DIAGNOSIS — Z191 Hormone sensitive malignancy status: Secondary | ICD-10-CM | POA: Diagnosis not present

## 2023-08-06 DIAGNOSIS — C61 Malignant neoplasm of prostate: Secondary | ICD-10-CM | POA: Diagnosis not present

## 2023-08-06 DIAGNOSIS — Z510 Encounter for antineoplastic radiation therapy: Secondary | ICD-10-CM | POA: Diagnosis not present

## 2023-08-06 LAB — RAD ONC ARIA SESSION SUMMARY
Course Elapsed Days: 22
Plan Fractions Treated to Date: 14
Plan Prescribed Dose Per Fraction: 1.8 Gy
Plan Total Fractions Prescribed: 25
Plan Total Prescribed Dose: 45 Gy
Reference Point Dosage Given to Date: 25.2 Gy
Reference Point Session Dosage Given: 1.8 Gy
Session Number: 14

## 2023-08-06 NOTE — Progress Notes (Signed)
Recommendations for patient to continue with LT-ADT.  Patient received Eligard on 8/13 at [facility].   RN coordinated for patient to have PSA at [facility] on 2/25 at 2:15 followed by MD visit with Dr. Cardell Peach on 3/4 at 8am to continue with ADT.   RN spoke with patient to inform him of appointments, will send through my chart as requested.

## 2023-08-07 ENCOUNTER — Other Ambulatory Visit: Payer: Self-pay

## 2023-08-07 ENCOUNTER — Ambulatory Visit
Admission: RE | Admit: 2023-08-07 | Discharge: 2023-08-07 | Disposition: A | Discharge status: Home / Self Care | Payer: Medicare Other | Source: Ambulatory Visit | Attending: Radiation Oncology | Admitting: Radiation Oncology

## 2023-08-07 DIAGNOSIS — Z510 Encounter for antineoplastic radiation therapy: Secondary | ICD-10-CM | POA: Diagnosis not present

## 2023-08-07 DIAGNOSIS — C61 Malignant neoplasm of prostate: Secondary | ICD-10-CM | POA: Diagnosis not present

## 2023-08-07 DIAGNOSIS — Z191 Hormone sensitive malignancy status: Secondary | ICD-10-CM | POA: Diagnosis not present

## 2023-08-07 LAB — RAD ONC ARIA SESSION SUMMARY
Course Elapsed Days: 23
Plan Fractions Treated to Date: 15
Plan Prescribed Dose Per Fraction: 1.8 Gy
Plan Total Fractions Prescribed: 25
Plan Total Prescribed Dose: 45 Gy
Reference Point Dosage Given to Date: 27 Gy
Reference Point Session Dosage Given: 1.8 Gy
Session Number: 15

## 2023-08-08 ENCOUNTER — Ambulatory Visit
Admission: RE | Admit: 2023-08-08 | Discharge: 2023-08-08 | Disposition: A | Discharge status: Home / Self Care | Payer: Medicare Other | Source: Ambulatory Visit | Attending: Radiation Oncology | Admitting: Radiation Oncology

## 2023-08-08 ENCOUNTER — Other Ambulatory Visit: Payer: Self-pay

## 2023-08-08 DIAGNOSIS — Z191 Hormone sensitive malignancy status: Secondary | ICD-10-CM | POA: Diagnosis not present

## 2023-08-08 DIAGNOSIS — C61 Malignant neoplasm of prostate: Secondary | ICD-10-CM | POA: Diagnosis not present

## 2023-08-08 DIAGNOSIS — Z510 Encounter for antineoplastic radiation therapy: Secondary | ICD-10-CM | POA: Diagnosis not present

## 2023-08-08 LAB — RAD ONC ARIA SESSION SUMMARY
Course Elapsed Days: 24
Plan Fractions Treated to Date: 16
Plan Prescribed Dose Per Fraction: 1.8 Gy
Plan Total Fractions Prescribed: 25
Plan Total Prescribed Dose: 45 Gy
Reference Point Dosage Given to Date: 28.8 Gy
Reference Point Session Dosage Given: 1.8 Gy
Session Number: 16

## 2023-08-09 ENCOUNTER — Ambulatory Visit
Admission: RE | Admit: 2023-08-09 | Discharge: 2023-08-09 | Disposition: A | Discharge status: Home / Self Care | Payer: Medicare Other | Source: Ambulatory Visit | Attending: Radiation Oncology | Admitting: Radiation Oncology

## 2023-08-09 ENCOUNTER — Other Ambulatory Visit: Payer: Self-pay

## 2023-08-09 DIAGNOSIS — Z191 Hormone sensitive malignancy status: Secondary | ICD-10-CM | POA: Diagnosis not present

## 2023-08-09 DIAGNOSIS — Z510 Encounter for antineoplastic radiation therapy: Secondary | ICD-10-CM | POA: Diagnosis not present

## 2023-08-09 DIAGNOSIS — C61 Malignant neoplasm of prostate: Secondary | ICD-10-CM | POA: Diagnosis not present

## 2023-08-09 LAB — RAD ONC ARIA SESSION SUMMARY
Course Elapsed Days: 25
Plan Fractions Treated to Date: 17
Plan Prescribed Dose Per Fraction: 1.8 Gy
Plan Total Fractions Prescribed: 25
Plan Total Prescribed Dose: 45 Gy
Reference Point Dosage Given to Date: 30.6 Gy
Reference Point Session Dosage Given: 1.8 Gy
Session Number: 17

## 2023-08-12 ENCOUNTER — Ambulatory Visit
Admission: RE | Admit: 2023-08-12 | Discharge: 2023-08-12 | Disposition: A | Discharge status: Home / Self Care | Payer: Medicare Other | Source: Ambulatory Visit | Attending: Radiation Oncology | Admitting: Radiation Oncology

## 2023-08-12 ENCOUNTER — Other Ambulatory Visit: Payer: Self-pay

## 2023-08-12 DIAGNOSIS — Z191 Hormone sensitive malignancy status: Secondary | ICD-10-CM | POA: Diagnosis not present

## 2023-08-12 DIAGNOSIS — Z510 Encounter for antineoplastic radiation therapy: Secondary | ICD-10-CM | POA: Diagnosis not present

## 2023-08-12 DIAGNOSIS — C61 Malignant neoplasm of prostate: Secondary | ICD-10-CM | POA: Insufficient documentation

## 2023-08-12 LAB — RAD ONC ARIA SESSION SUMMARY
Course Elapsed Days: 28
Plan Fractions Treated to Date: 18
Plan Prescribed Dose Per Fraction: 1.8 Gy
Plan Total Fractions Prescribed: 25
Plan Total Prescribed Dose: 45 Gy
Reference Point Dosage Given to Date: 32.4 Gy
Reference Point Session Dosage Given: 1.8 Gy
Session Number: 18

## 2023-08-13 ENCOUNTER — Ambulatory Visit
Admission: RE | Admit: 2023-08-13 | Discharge: 2023-08-13 | Disposition: A | Discharge status: Home / Self Care | Payer: Medicare Other | Source: Ambulatory Visit | Attending: Radiation Oncology | Admitting: Radiation Oncology

## 2023-08-13 ENCOUNTER — Other Ambulatory Visit: Payer: Self-pay

## 2023-08-13 DIAGNOSIS — Z510 Encounter for antineoplastic radiation therapy: Secondary | ICD-10-CM | POA: Diagnosis not present

## 2023-08-13 DIAGNOSIS — C61 Malignant neoplasm of prostate: Secondary | ICD-10-CM | POA: Diagnosis not present

## 2023-08-13 DIAGNOSIS — Z191 Hormone sensitive malignancy status: Secondary | ICD-10-CM | POA: Diagnosis not present

## 2023-08-13 LAB — RAD ONC ARIA SESSION SUMMARY
Course Elapsed Days: 29
Plan Fractions Treated to Date: 19
Plan Prescribed Dose Per Fraction: 1.8 Gy
Plan Total Fractions Prescribed: 25
Plan Total Prescribed Dose: 45 Gy
Reference Point Dosage Given to Date: 34.2 Gy
Reference Point Session Dosage Given: 1.8 Gy
Session Number: 19

## 2023-08-14 ENCOUNTER — Ambulatory Visit
Admission: RE | Admit: 2023-08-14 | Discharge: 2023-08-14 | Disposition: A | Discharge status: Home / Self Care | Payer: Medicare Other | Source: Ambulatory Visit | Attending: Radiation Oncology

## 2023-08-14 ENCOUNTER — Other Ambulatory Visit: Payer: Self-pay

## 2023-08-14 DIAGNOSIS — Z191 Hormone sensitive malignancy status: Secondary | ICD-10-CM | POA: Diagnosis not present

## 2023-08-14 DIAGNOSIS — C61 Malignant neoplasm of prostate: Secondary | ICD-10-CM | POA: Diagnosis not present

## 2023-08-14 DIAGNOSIS — Z510 Encounter for antineoplastic radiation therapy: Secondary | ICD-10-CM | POA: Diagnosis not present

## 2023-08-14 LAB — RAD ONC ARIA SESSION SUMMARY
Course Elapsed Days: 30
Plan Fractions Treated to Date: 20
Plan Prescribed Dose Per Fraction: 1.8 Gy
Plan Total Fractions Prescribed: 25
Plan Total Prescribed Dose: 45 Gy
Reference Point Dosage Given to Date: 36 Gy
Reference Point Session Dosage Given: 1.8 Gy
Session Number: 20

## 2023-08-15 ENCOUNTER — Ambulatory Visit
Admission: RE | Admit: 2023-08-15 | Discharge: 2023-08-15 | Disposition: A | Discharge status: Home / Self Care | Payer: Medicare Other | Source: Ambulatory Visit | Attending: Radiation Oncology | Admitting: Radiation Oncology

## 2023-08-15 ENCOUNTER — Other Ambulatory Visit: Payer: Self-pay

## 2023-08-15 DIAGNOSIS — C61 Malignant neoplasm of prostate: Secondary | ICD-10-CM | POA: Diagnosis not present

## 2023-08-15 DIAGNOSIS — Z191 Hormone sensitive malignancy status: Secondary | ICD-10-CM | POA: Diagnosis not present

## 2023-08-15 DIAGNOSIS — Z510 Encounter for antineoplastic radiation therapy: Secondary | ICD-10-CM | POA: Diagnosis not present

## 2023-08-15 LAB — RAD ONC ARIA SESSION SUMMARY
Course Elapsed Days: 31
Plan Fractions Treated to Date: 21
Plan Prescribed Dose Per Fraction: 1.8 Gy
Plan Total Fractions Prescribed: 25
Plan Total Prescribed Dose: 45 Gy
Reference Point Dosage Given to Date: 37.8 Gy
Reference Point Session Dosage Given: 1.8 Gy
Session Number: 21

## 2023-08-16 ENCOUNTER — Other Ambulatory Visit: Payer: Self-pay

## 2023-08-16 ENCOUNTER — Ambulatory Visit
Admission: RE | Admit: 2023-08-16 | Discharge: 2023-08-16 | Disposition: A | Discharge status: Home / Self Care | Payer: Medicare Other | Source: Ambulatory Visit | Attending: Radiation Oncology | Admitting: Radiation Oncology

## 2023-08-16 DIAGNOSIS — C61 Malignant neoplasm of prostate: Secondary | ICD-10-CM | POA: Diagnosis not present

## 2023-08-16 DIAGNOSIS — Z510 Encounter for antineoplastic radiation therapy: Secondary | ICD-10-CM | POA: Diagnosis not present

## 2023-08-16 DIAGNOSIS — Z191 Hormone sensitive malignancy status: Secondary | ICD-10-CM | POA: Diagnosis not present

## 2023-08-16 LAB — RAD ONC ARIA SESSION SUMMARY
Course Elapsed Days: 32
Plan Fractions Treated to Date: 22
Plan Prescribed Dose Per Fraction: 1.8 Gy
Plan Total Fractions Prescribed: 25
Plan Total Prescribed Dose: 45 Gy
Reference Point Dosage Given to Date: 39.6 Gy
Reference Point Session Dosage Given: 1.8 Gy
Session Number: 22

## 2023-08-19 ENCOUNTER — Ambulatory Visit
Admission: RE | Admit: 2023-08-19 | Discharge: 2023-08-19 | Disposition: A | Discharge status: Home / Self Care | Payer: Medicare Other | Source: Ambulatory Visit | Attending: Radiation Oncology | Admitting: Radiation Oncology

## 2023-08-19 ENCOUNTER — Ambulatory Visit: Payer: Medicare Other

## 2023-08-19 ENCOUNTER — Other Ambulatory Visit: Payer: Self-pay

## 2023-08-19 DIAGNOSIS — Z510 Encounter for antineoplastic radiation therapy: Secondary | ICD-10-CM | POA: Diagnosis not present

## 2023-08-19 DIAGNOSIS — Z191 Hormone sensitive malignancy status: Secondary | ICD-10-CM | POA: Diagnosis not present

## 2023-08-19 DIAGNOSIS — C61 Malignant neoplasm of prostate: Secondary | ICD-10-CM | POA: Diagnosis not present

## 2023-08-19 LAB — RAD ONC ARIA SESSION SUMMARY
Course Elapsed Days: 35
Plan Fractions Treated to Date: 23
Plan Prescribed Dose Per Fraction: 1.8 Gy
Plan Total Fractions Prescribed: 25
Plan Total Prescribed Dose: 45 Gy
Reference Point Dosage Given to Date: 41.4 Gy
Reference Point Session Dosage Given: 1.8 Gy
Session Number: 23

## 2023-08-20 ENCOUNTER — Ambulatory Visit: Payer: Medicare Other

## 2023-08-20 ENCOUNTER — Ambulatory Visit
Admission: RE | Admit: 2023-08-20 | Discharge: 2023-08-20 | Discharge status: Home / Self Care | Payer: Medicare Other | Source: Ambulatory Visit | Attending: Radiation Oncology

## 2023-08-20 ENCOUNTER — Other Ambulatory Visit: Payer: Self-pay

## 2023-08-20 DIAGNOSIS — Z191 Hormone sensitive malignancy status: Secondary | ICD-10-CM | POA: Diagnosis not present

## 2023-08-20 DIAGNOSIS — Z510 Encounter for antineoplastic radiation therapy: Secondary | ICD-10-CM | POA: Diagnosis not present

## 2023-08-20 DIAGNOSIS — C61 Malignant neoplasm of prostate: Secondary | ICD-10-CM | POA: Diagnosis not present

## 2023-08-20 LAB — RAD ONC ARIA SESSION SUMMARY
Course Elapsed Days: 36
Plan Fractions Treated to Date: 24
Plan Prescribed Dose Per Fraction: 1.8 Gy
Plan Total Fractions Prescribed: 25
Plan Total Prescribed Dose: 45 Gy
Reference Point Dosage Given to Date: 43.2 Gy
Reference Point Session Dosage Given: 1.8 Gy
Session Number: 24

## 2023-08-21 ENCOUNTER — Other Ambulatory Visit: Payer: Self-pay

## 2023-08-21 ENCOUNTER — Ambulatory Visit: Payer: Medicare Other

## 2023-08-21 ENCOUNTER — Ambulatory Visit
Admission: RE | Admit: 2023-08-21 | Discharge: 2023-08-21 | Disposition: A | Discharge status: Home / Self Care | Payer: Medicare Other | Source: Ambulatory Visit | Attending: Radiation Oncology | Admitting: Radiation Oncology

## 2023-08-21 DIAGNOSIS — Z191 Hormone sensitive malignancy status: Secondary | ICD-10-CM | POA: Diagnosis not present

## 2023-08-21 DIAGNOSIS — Z510 Encounter for antineoplastic radiation therapy: Secondary | ICD-10-CM | POA: Diagnosis not present

## 2023-08-21 DIAGNOSIS — C61 Malignant neoplasm of prostate: Secondary | ICD-10-CM | POA: Diagnosis not present

## 2023-08-21 LAB — RAD ONC ARIA SESSION SUMMARY
Course Elapsed Days: 37
Plan Fractions Treated to Date: 25
Plan Prescribed Dose Per Fraction: 1.8 Gy
Plan Total Fractions Prescribed: 25
Plan Total Prescribed Dose: 45 Gy
Reference Point Dosage Given to Date: 45 Gy
Reference Point Session Dosage Given: 1.8 Gy
Session Number: 25

## 2023-08-22 ENCOUNTER — Ambulatory Visit
Admission: RE | Admit: 2023-08-22 | Discharge: 2023-08-22 | Disposition: A | Discharge status: Home / Self Care | Payer: Medicare Other | Source: Ambulatory Visit | Attending: Radiation Oncology

## 2023-08-22 ENCOUNTER — Other Ambulatory Visit: Payer: Self-pay

## 2023-08-22 DIAGNOSIS — C61 Malignant neoplasm of prostate: Secondary | ICD-10-CM | POA: Diagnosis not present

## 2023-08-22 DIAGNOSIS — Z510 Encounter for antineoplastic radiation therapy: Secondary | ICD-10-CM | POA: Diagnosis not present

## 2023-08-22 DIAGNOSIS — Z191 Hormone sensitive malignancy status: Secondary | ICD-10-CM | POA: Diagnosis not present

## 2023-08-22 LAB — RAD ONC ARIA SESSION SUMMARY
Course Elapsed Days: 38
Plan Fractions Treated to Date: 1
Plan Prescribed Dose Per Fraction: 1.8 Gy
Plan Total Fractions Prescribed: 13
Plan Total Prescribed Dose: 23.4 Gy
Reference Point Dosage Given to Date: 1.8 Gy
Reference Point Session Dosage Given: 1.8 Gy
Session Number: 26

## 2023-08-23 ENCOUNTER — Other Ambulatory Visit: Payer: Self-pay

## 2023-08-23 ENCOUNTER — Ambulatory Visit
Admission: RE | Admit: 2023-08-23 | Discharge: 2023-08-23 | Disposition: A | Discharge status: Home / Self Care | Payer: Medicare Other | Source: Ambulatory Visit | Attending: Radiation Oncology | Admitting: Radiation Oncology

## 2023-08-23 DIAGNOSIS — C61 Malignant neoplasm of prostate: Secondary | ICD-10-CM | POA: Diagnosis not present

## 2023-08-23 DIAGNOSIS — Z191 Hormone sensitive malignancy status: Secondary | ICD-10-CM | POA: Diagnosis not present

## 2023-08-23 DIAGNOSIS — Z510 Encounter for antineoplastic radiation therapy: Secondary | ICD-10-CM | POA: Diagnosis not present

## 2023-08-23 LAB — RAD ONC ARIA SESSION SUMMARY
Course Elapsed Days: 39
Plan Fractions Treated to Date: 2
Plan Prescribed Dose Per Fraction: 1.8 Gy
Plan Total Fractions Prescribed: 13
Plan Total Prescribed Dose: 23.4 Gy
Reference Point Dosage Given to Date: 3.6 Gy
Reference Point Session Dosage Given: 1.8 Gy
Session Number: 27

## 2023-08-26 ENCOUNTER — Ambulatory Visit
Admission: RE | Admit: 2023-08-26 | Discharge: 2023-08-26 | Disposition: A | Discharge status: Home / Self Care | Payer: Medicare Other | Source: Ambulatory Visit | Attending: Radiation Oncology

## 2023-08-26 ENCOUNTER — Other Ambulatory Visit: Payer: Self-pay

## 2023-08-26 DIAGNOSIS — Z510 Encounter for antineoplastic radiation therapy: Secondary | ICD-10-CM | POA: Diagnosis not present

## 2023-08-26 DIAGNOSIS — Z191 Hormone sensitive malignancy status: Secondary | ICD-10-CM | POA: Diagnosis not present

## 2023-08-26 DIAGNOSIS — C61 Malignant neoplasm of prostate: Secondary | ICD-10-CM | POA: Diagnosis not present

## 2023-08-26 LAB — RAD ONC ARIA SESSION SUMMARY
Course Elapsed Days: 42
Plan Fractions Treated to Date: 3
Plan Prescribed Dose Per Fraction: 1.8 Gy
Plan Total Fractions Prescribed: 13
Plan Total Prescribed Dose: 23.4 Gy
Reference Point Dosage Given to Date: 5.4 Gy
Reference Point Session Dosage Given: 1.8 Gy
Session Number: 28

## 2023-08-27 ENCOUNTER — Other Ambulatory Visit: Payer: Self-pay

## 2023-08-27 ENCOUNTER — Ambulatory Visit
Admission: RE | Admit: 2023-08-27 | Discharge: 2023-08-27 | Disposition: A | Discharge status: Home / Self Care | Payer: Medicare Other | Source: Ambulatory Visit | Attending: Radiation Oncology

## 2023-08-27 DIAGNOSIS — Z1389 Encounter for screening for other disorder: Secondary | ICD-10-CM | POA: Diagnosis not present

## 2023-08-27 DIAGNOSIS — Z191 Hormone sensitive malignancy status: Secondary | ICD-10-CM | POA: Diagnosis not present

## 2023-08-27 DIAGNOSIS — E785 Hyperlipidemia, unspecified: Secondary | ICD-10-CM | POA: Diagnosis not present

## 2023-08-27 DIAGNOSIS — Z510 Encounter for antineoplastic radiation therapy: Secondary | ICD-10-CM | POA: Diagnosis not present

## 2023-08-27 DIAGNOSIS — M1991 Primary osteoarthritis, unspecified site: Secondary | ICD-10-CM | POA: Diagnosis not present

## 2023-08-27 DIAGNOSIS — K219 Gastro-esophageal reflux disease without esophagitis: Secondary | ICD-10-CM | POA: Diagnosis not present

## 2023-08-27 DIAGNOSIS — C61 Malignant neoplasm of prostate: Secondary | ICD-10-CM | POA: Diagnosis not present

## 2023-08-27 DIAGNOSIS — J449 Chronic obstructive pulmonary disease, unspecified: Secondary | ICD-10-CM | POA: Diagnosis not present

## 2023-08-27 DIAGNOSIS — Z1331 Encounter for screening for depression: Secondary | ICD-10-CM | POA: Diagnosis not present

## 2023-08-27 DIAGNOSIS — E118 Type 2 diabetes mellitus with unspecified complications: Secondary | ICD-10-CM | POA: Diagnosis not present

## 2023-08-27 DIAGNOSIS — I10 Essential (primary) hypertension: Secondary | ICD-10-CM | POA: Diagnosis not present

## 2023-08-27 LAB — RAD ONC ARIA SESSION SUMMARY
Course Elapsed Days: 43
Plan Fractions Treated to Date: 4
Plan Prescribed Dose Per Fraction: 1.8 Gy
Plan Total Fractions Prescribed: 13
Plan Total Prescribed Dose: 23.4 Gy
Reference Point Dosage Given to Date: 7.2 Gy
Reference Point Session Dosage Given: 1.8 Gy
Session Number: 29

## 2023-08-28 ENCOUNTER — Ambulatory Visit: Payer: Medicare Other

## 2023-08-28 ENCOUNTER — Other Ambulatory Visit: Payer: Self-pay

## 2023-08-28 ENCOUNTER — Ambulatory Visit
Admission: RE | Admit: 2023-08-28 | Discharge: 2023-08-28 | Disposition: A | Discharge status: Home / Self Care | Payer: Medicare Other | Source: Ambulatory Visit | Attending: Radiation Oncology | Admitting: Radiation Oncology

## 2023-08-28 DIAGNOSIS — E118 Type 2 diabetes mellitus with unspecified complications: Secondary | ICD-10-CM | POA: Diagnosis not present

## 2023-08-28 DIAGNOSIS — Z510 Encounter for antineoplastic radiation therapy: Secondary | ICD-10-CM | POA: Diagnosis not present

## 2023-08-28 DIAGNOSIS — E785 Hyperlipidemia, unspecified: Secondary | ICD-10-CM | POA: Diagnosis not present

## 2023-08-28 DIAGNOSIS — N390 Urinary tract infection, site not specified: Secondary | ICD-10-CM | POA: Diagnosis not present

## 2023-08-28 DIAGNOSIS — Z191 Hormone sensitive malignancy status: Secondary | ICD-10-CM | POA: Diagnosis not present

## 2023-08-28 DIAGNOSIS — C61 Malignant neoplasm of prostate: Secondary | ICD-10-CM | POA: Diagnosis not present

## 2023-08-28 DIAGNOSIS — I10 Essential (primary) hypertension: Secondary | ICD-10-CM | POA: Diagnosis not present

## 2023-08-28 LAB — RAD ONC ARIA SESSION SUMMARY
Course Elapsed Days: 44
Plan Fractions Treated to Date: 5
Plan Prescribed Dose Per Fraction: 1.8 Gy
Plan Total Fractions Prescribed: 13
Plan Total Prescribed Dose: 23.4 Gy
Reference Point Dosage Given to Date: 9 Gy
Reference Point Session Dosage Given: 1.8 Gy
Session Number: 30

## 2023-08-29 ENCOUNTER — Ambulatory Visit
Admission: RE | Admit: 2023-08-29 | Discharge: 2023-08-29 | Disposition: A | Discharge status: Home / Self Care | Payer: Medicare Other | Source: Ambulatory Visit | Attending: Radiation Oncology

## 2023-08-29 ENCOUNTER — Other Ambulatory Visit: Payer: Self-pay

## 2023-08-29 DIAGNOSIS — Z510 Encounter for antineoplastic radiation therapy: Secondary | ICD-10-CM | POA: Diagnosis not present

## 2023-08-29 DIAGNOSIS — Z191 Hormone sensitive malignancy status: Secondary | ICD-10-CM | POA: Diagnosis not present

## 2023-08-29 DIAGNOSIS — C61 Malignant neoplasm of prostate: Secondary | ICD-10-CM | POA: Diagnosis not present

## 2023-08-29 LAB — RAD ONC ARIA SESSION SUMMARY
Course Elapsed Days: 45
Plan Fractions Treated to Date: 6
Plan Prescribed Dose Per Fraction: 1.8 Gy
Plan Total Fractions Prescribed: 13
Plan Total Prescribed Dose: 23.4 Gy
Reference Point Dosage Given to Date: 10.8 Gy
Reference Point Session Dosage Given: 1.8 Gy
Session Number: 31

## 2023-08-30 ENCOUNTER — Other Ambulatory Visit: Payer: Self-pay

## 2023-08-30 ENCOUNTER — Ambulatory Visit
Admission: RE | Admit: 2023-08-30 | Discharge: 2023-08-30 | Disposition: A | Discharge status: Home / Self Care | Payer: Medicare Other | Source: Ambulatory Visit | Attending: Radiation Oncology | Admitting: Radiation Oncology

## 2023-08-30 DIAGNOSIS — Z191 Hormone sensitive malignancy status: Secondary | ICD-10-CM | POA: Diagnosis not present

## 2023-08-30 DIAGNOSIS — Z510 Encounter for antineoplastic radiation therapy: Secondary | ICD-10-CM | POA: Diagnosis not present

## 2023-08-30 DIAGNOSIS — C61 Malignant neoplasm of prostate: Secondary | ICD-10-CM | POA: Diagnosis not present

## 2023-08-30 LAB — RAD ONC ARIA SESSION SUMMARY
Course Elapsed Days: 46
Plan Fractions Treated to Date: 7
Plan Prescribed Dose Per Fraction: 1.8 Gy
Plan Total Fractions Prescribed: 13
Plan Total Prescribed Dose: 23.4 Gy
Reference Point Dosage Given to Date: 12.6 Gy
Reference Point Session Dosage Given: 1.8 Gy
Session Number: 32

## 2023-09-02 ENCOUNTER — Other Ambulatory Visit: Payer: Self-pay

## 2023-09-02 ENCOUNTER — Ambulatory Visit
Admission: RE | Admit: 2023-09-02 | Discharge: 2023-09-02 | Disposition: A | Discharge status: Home / Self Care | Payer: Medicare Other | Source: Ambulatory Visit | Attending: Radiation Oncology | Admitting: Radiation Oncology

## 2023-09-02 DIAGNOSIS — Z191 Hormone sensitive malignancy status: Secondary | ICD-10-CM | POA: Diagnosis not present

## 2023-09-02 DIAGNOSIS — Z510 Encounter for antineoplastic radiation therapy: Secondary | ICD-10-CM | POA: Diagnosis not present

## 2023-09-02 DIAGNOSIS — C61 Malignant neoplasm of prostate: Secondary | ICD-10-CM | POA: Diagnosis not present

## 2023-09-02 LAB — RAD ONC ARIA SESSION SUMMARY
Course Elapsed Days: 49
Plan Fractions Treated to Date: 8
Plan Prescribed Dose Per Fraction: 1.8 Gy
Plan Total Fractions Prescribed: 13
Plan Total Prescribed Dose: 23.4 Gy
Reference Point Dosage Given to Date: 14.4 Gy
Reference Point Session Dosage Given: 1.8 Gy
Session Number: 33

## 2023-09-03 ENCOUNTER — Ambulatory Visit
Admission: RE | Admit: 2023-09-03 | Discharge: 2023-09-03 | Disposition: A | Discharge status: Home / Self Care | Payer: Medicare Other | Source: Ambulatory Visit | Attending: Radiation Oncology | Admitting: Radiation Oncology

## 2023-09-03 ENCOUNTER — Other Ambulatory Visit: Payer: Self-pay

## 2023-09-03 DIAGNOSIS — Z191 Hormone sensitive malignancy status: Secondary | ICD-10-CM | POA: Diagnosis not present

## 2023-09-03 DIAGNOSIS — C61 Malignant neoplasm of prostate: Secondary | ICD-10-CM | POA: Diagnosis not present

## 2023-09-03 DIAGNOSIS — Z510 Encounter for antineoplastic radiation therapy: Secondary | ICD-10-CM | POA: Diagnosis not present

## 2023-09-03 LAB — RAD ONC ARIA SESSION SUMMARY
Course Elapsed Days: 50
Plan Fractions Treated to Date: 9
Plan Prescribed Dose Per Fraction: 1.8 Gy
Plan Total Fractions Prescribed: 13
Plan Total Prescribed Dose: 23.4 Gy
Reference Point Dosage Given to Date: 16.2 Gy
Reference Point Session Dosage Given: 1.8 Gy
Session Number: 34

## 2023-09-04 ENCOUNTER — Other Ambulatory Visit: Payer: Self-pay

## 2023-09-04 ENCOUNTER — Ambulatory Visit: Payer: Medicare Other

## 2023-09-04 ENCOUNTER — Ambulatory Visit
Admission: RE | Admit: 2023-09-04 | Discharge: 2023-09-04 | Disposition: A | Discharge status: Home / Self Care | Payer: Medicare Other | Source: Ambulatory Visit | Attending: Radiation Oncology | Admitting: Radiation Oncology

## 2023-09-04 DIAGNOSIS — Z191 Hormone sensitive malignancy status: Secondary | ICD-10-CM | POA: Diagnosis not present

## 2023-09-04 DIAGNOSIS — Z510 Encounter for antineoplastic radiation therapy: Secondary | ICD-10-CM | POA: Diagnosis not present

## 2023-09-04 DIAGNOSIS — C61 Malignant neoplasm of prostate: Secondary | ICD-10-CM | POA: Diagnosis not present

## 2023-09-04 LAB — RAD ONC ARIA SESSION SUMMARY
Course Elapsed Days: 51
Plan Fractions Treated to Date: 10
Plan Prescribed Dose Per Fraction: 1.8 Gy
Plan Total Fractions Prescribed: 13
Plan Total Prescribed Dose: 23.4 Gy
Reference Point Dosage Given to Date: 18 Gy
Reference Point Session Dosage Given: 1.8 Gy
Session Number: 35

## 2023-09-05 ENCOUNTER — Ambulatory Visit: Payer: Medicare Other

## 2023-09-05 ENCOUNTER — Other Ambulatory Visit: Payer: Self-pay

## 2023-09-05 ENCOUNTER — Ambulatory Visit
Admission: RE | Admit: 2023-09-05 | Discharge: 2023-09-05 | Disposition: A | Discharge status: Home / Self Care | Payer: Medicare Other | Source: Ambulatory Visit | Attending: Radiation Oncology | Admitting: Radiation Oncology

## 2023-09-05 DIAGNOSIS — Z510 Encounter for antineoplastic radiation therapy: Secondary | ICD-10-CM | POA: Diagnosis not present

## 2023-09-05 DIAGNOSIS — Z191 Hormone sensitive malignancy status: Secondary | ICD-10-CM | POA: Diagnosis not present

## 2023-09-05 DIAGNOSIS — C61 Malignant neoplasm of prostate: Secondary | ICD-10-CM | POA: Diagnosis not present

## 2023-09-05 LAB — RAD ONC ARIA SESSION SUMMARY
Course Elapsed Days: 52
Plan Fractions Treated to Date: 11
Plan Prescribed Dose Per Fraction: 1.8 Gy
Plan Total Fractions Prescribed: 13
Plan Total Prescribed Dose: 23.4 Gy
Reference Point Dosage Given to Date: 19.8 Gy
Reference Point Session Dosage Given: 1.8 Gy
Session Number: 36

## 2023-09-06 ENCOUNTER — Ambulatory Visit: Payer: Medicare Other

## 2023-09-06 ENCOUNTER — Ambulatory Visit
Admission: RE | Admit: 2023-09-06 | Discharge: 2023-09-06 | Disposition: A | Discharge status: Home / Self Care | Payer: Medicare Other | Source: Ambulatory Visit | Attending: Radiation Oncology | Admitting: Radiation Oncology

## 2023-09-06 ENCOUNTER — Other Ambulatory Visit: Payer: Self-pay

## 2023-09-06 DIAGNOSIS — Z191 Hormone sensitive malignancy status: Secondary | ICD-10-CM | POA: Diagnosis not present

## 2023-09-06 DIAGNOSIS — C61 Malignant neoplasm of prostate: Secondary | ICD-10-CM | POA: Diagnosis not present

## 2023-09-06 DIAGNOSIS — Z510 Encounter for antineoplastic radiation therapy: Secondary | ICD-10-CM | POA: Diagnosis not present

## 2023-09-06 LAB — RAD ONC ARIA SESSION SUMMARY
Course Elapsed Days: 53
Plan Fractions Treated to Date: 12
Plan Prescribed Dose Per Fraction: 1.8 Gy
Plan Total Fractions Prescribed: 13
Plan Total Prescribed Dose: 23.4 Gy
Reference Point Dosage Given to Date: 21.6 Gy
Reference Point Session Dosage Given: 1.8 Gy
Session Number: 37

## 2023-09-06 NOTE — Progress Notes (Addendum)
 RN left message for call back to follow up with mychart message that was sent.   RN spoke with patient and followed up regarding pain on right side.  Patient reports pain in on right lower side of abd, and usually notices when he is bending down to put on shoes.  Denies any fever, nausea, able to void, and no concerns with bowels. Per patient no distention.  Palpable pain only with bending.  Patient reports he has been in contact with PCP and Dr. Dillard Essex office about this as well.  Awaiting call back/recommendations.  RN encouraged patient to reach out to GI as well.    RN provided recommendations to continue OTC pain medications.  Educated patient to report to ED if fever, vomiting, extreme pain, or inability to void develop.  Patient verbalized understanding.

## 2023-09-09 ENCOUNTER — Ambulatory Visit
Admission: RE | Admit: 2023-09-09 | Discharge: 2023-09-09 | Disposition: A | Discharge status: Home / Self Care | Payer: Medicare Other | Source: Ambulatory Visit | Attending: Radiation Oncology | Admitting: Radiation Oncology

## 2023-09-09 ENCOUNTER — Other Ambulatory Visit: Payer: Self-pay

## 2023-09-09 DIAGNOSIS — C61 Malignant neoplasm of prostate: Secondary | ICD-10-CM | POA: Diagnosis not present

## 2023-09-09 DIAGNOSIS — Z191 Hormone sensitive malignancy status: Secondary | ICD-10-CM | POA: Diagnosis not present

## 2023-09-09 DIAGNOSIS — Z510 Encounter for antineoplastic radiation therapy: Secondary | ICD-10-CM | POA: Diagnosis not present

## 2023-09-09 LAB — RAD ONC ARIA SESSION SUMMARY
Course Elapsed Days: 56
Plan Fractions Treated to Date: 13
Plan Prescribed Dose Per Fraction: 1.8 Gy
Plan Total Fractions Prescribed: 13
Plan Total Prescribed Dose: 23.4 Gy
Reference Point Dosage Given to Date: 23.4 Gy
Reference Point Session Dosage Given: 1.8 Gy
Session Number: 38

## 2023-09-10 NOTE — Radiation Completion Notes (Addendum)
  Radiation Oncology         [phone] ________________________________  Name: Travis Blanchard MRN: [id number]  Date: 09/09/2023  DOB: [birth year]  Referring Physician: Jettie Pagan, M.D. Date of Service: 2023-09-10 Radiation Oncologist: Margaretmary Bayley, M.D. [facility]     RADIATION ONCOLOGY END OF TREATMENT NOTE     Diagnosis: 78 y.o. 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; s/p prostatectomy in 2007   Intent: Curative     ==========DELIVERED PLANS==========  First Treatment Date: 2023-07-15 Last Treatment Date: 2023-09-09   Plan Name: ProstBed_Pelv Site: Prostate Bed Technique: IMRT Mode: Photon Dose Per Fraction: 1.8 Gy Prescribed Dose (Delivered / Prescribed): 45 Gy / 45 Gy Prescribed Fxs (Delivered / Prescribed): 25 / 25   Plan Name: ProstBed_Bst Site: Prostate Bed Technique: IMRT Mode: Photon Dose Per Fraction: 1.8 Gy Prescribed Dose (Delivered / Prescribed): 23.4 Gy / 23.4 Gy Prescribed Fxs (Delivered / Prescribed): 13 / 13     ==========ON TREATMENT VISIT DATES========== 2023-07-22, 2023-07-26, 2023-08-02, 2023-08-09, 2023-08-16, 2023-08-23, 2023-08-30, 2023-09-06   See weekly On Treatment Notes in Epic for details in the Media tab (listed as Progress notes on the On Treatment Visit Dates listed above).  He tolerated the radiation treatments relatively well but did report some increased nocturia and frequent, loose stools.  He also reported modest fatigue.  The patient will receive a call in about one month from the radiation oncology department. He will continue follow up with his urologist, Dr. Cardell Peach, as well.  ------------------------------------------------   Margaretmary Dys, MD [facility]  Radiation Oncology Direct Dial: [phone]  Fax: [phone] [email or web address]  Skype  LinkedIn

## 2023-09-13 NOTE — Progress Notes (Signed)
 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  Patient proceed with treatment recommendations of 7.5 weeks of salvage external beam therapy concurrent with ADT and had his final radiation treatment on 3/3.   Patient is scheduled for a post treatment nurse call on 4/1 and has ongoing follow up's with urology.  Patient will receive Eligard on 3/18.   RN spoke with patient and provided post treatment education.  All questions answered.

## 2023-09-24 DIAGNOSIS — E118 Type 2 diabetes mellitus with unspecified complications: Secondary | ICD-10-CM | POA: Diagnosis not present

## 2023-09-24 DIAGNOSIS — I10 Essential (primary) hypertension: Secondary | ICD-10-CM | POA: Diagnosis not present

## 2023-09-30 NOTE — Progress Notes (Addendum)
  Radiation Oncology         [phone] ________________________________  Name: Travis Blanchard MRN: [id number]  Date of Service: 09/30/2023  DOB: July 17, 1945  Post Treatment Telephone Note  Diagnosis:  C61 Malignant neoplasm of prostate (as documented in provider EOT note)  Pre Treatment IPSS Score: 6 (as documented in the provider consult note)  The patient was available for call today.   Symptoms of fatigue have improved since completing therapy.  Symptoms of bladder changes have improved since completing therapy. Current symptoms include urinary urgency, and medications for bladder symptoms include none.  Symptoms of bowel changes have improved since completing therapy. Current symptoms include mild diarrhea, and medications for bowel symptoms include Metamucil.   Post Treatment IPSS Score: IPSS Questionnaire (AUA-7): Over the past month.   1)  How often have you had a sensation of not emptying your bladder completely after you finish urinating?  3 - About half the time  2)  How often have you had to urinate again less than two hours after you finished urinating? 2 - Less than half the time  3)  How often have you found you stopped and started again several times when you urinated?  1 - Less than 1 time in 5  4) How difficult have you found it to postpone urination?  3 - About half the time  5) How often have you had a weak urinary stream?  3 - About half the time  6) How often have you had to push or strain to begin urination?  0 - Not at all  7) How many times did you most typically get up to urinate from the time you went to bed until the time you got up in the morning?  2 - 2 times  Total score:  14. Which indicates moderate symptoms  0-7 mildly symptomatic   8-19 moderately symptomatic   20-35 severely symptomatic    Patient has a scheduled follow up visit with his urologist, Dr. Cardell Peach, on 10/2023 for ongoing surveillance. He was counseled that PSA levels will be drawn in  the urology office, and was reassured that additional time is expected to improve bowel and bladder symptoms. He was encouraged to call back with concerns or questions regarding radiation.   This concludes the interaction.  Ruel Favors, LPN

## 2023-10-08 ENCOUNTER — Ambulatory Visit
Admission: RE | Admit: 2023-10-08 | Discharge: 2023-10-08 | Disposition: A | Discharge status: Home / Self Care | Source: Ambulatory Visit | Attending: Radiation Oncology | Admitting: Radiation Oncology

## 2023-10-09 DIAGNOSIS — R1084 Generalized abdominal pain: Secondary | ICD-10-CM | POA: Diagnosis not present

## 2023-10-09 DIAGNOSIS — R109 Unspecified abdominal pain: Secondary | ICD-10-CM | POA: Diagnosis not present

## 2023-10-18 ENCOUNTER — Other Ambulatory Visit: Payer: Self-pay | Admitting: Urology

## 2023-10-18 DIAGNOSIS — C61 Malignant neoplasm of prostate: Secondary | ICD-10-CM

## 2023-10-23 ENCOUNTER — Ambulatory Visit: Admitting: Gastroenterology

## 2023-10-25 DIAGNOSIS — E118 Type 2 diabetes mellitus with unspecified complications: Secondary | ICD-10-CM | POA: Diagnosis not present

## 2023-10-25 DIAGNOSIS — I10 Essential (primary) hypertension: Secondary | ICD-10-CM | POA: Diagnosis not present

## 2023-11-05 ENCOUNTER — Encounter: Payer: Self-pay | Admitting: *Deleted

## 2023-11-24 DIAGNOSIS — I10 Essential (primary) hypertension: Secondary | ICD-10-CM | POA: Diagnosis not present

## 2023-11-24 DIAGNOSIS — E118 Type 2 diabetes mellitus with unspecified complications: Secondary | ICD-10-CM | POA: Diagnosis not present

## 2023-11-25 ENCOUNTER — Inpatient Hospital Stay: Attending: Adult Health | Admitting: *Deleted

## 2023-11-25 ENCOUNTER — Encounter: Payer: Self-pay | Admitting: *Deleted

## 2023-11-25 DIAGNOSIS — C61 Malignant neoplasm of prostate: Secondary | ICD-10-CM

## 2023-11-25 NOTE — Progress Notes (Signed)
 SCP reviewed and completed . Allergies and meds reviewed and updated. Pt does not smoke or use illicit drugs. Pt drinks on special occasions. Nutrition and exercise were discussed.pt has started walking 20-30 minutes daily and mows his own lawn. Vaccines were discussed and updated. Last colonoscopy was 05/21/2023. Pt also has hernia repair at the same time. Repeat colonoscopy 05/2024. Most recent PSA labs were <0.015 on 09/03/2023. PSA labs to be repeated in 6 months and Eligard  injection also. Pt does experience hotflashes but says he is tolerating them pretty well. A copy of prostate tx summary was sent to PCP.

## 2023-12-17 ENCOUNTER — Telehealth: Payer: Self-pay

## 2023-12-17 DIAGNOSIS — E66812 Obesity, class 2: Secondary | ICD-10-CM | POA: Diagnosis not present

## 2023-12-17 DIAGNOSIS — Z903 Acquired absence of stomach [part of]: Secondary | ICD-10-CM | POA: Diagnosis not present

## 2023-12-17 DIAGNOSIS — Z8546 Personal history of malignant neoplasm of prostate: Secondary | ICD-10-CM | POA: Diagnosis not present

## 2023-12-17 DIAGNOSIS — D6489 Other specified anemias: Secondary | ICD-10-CM | POA: Diagnosis not present

## 2023-12-17 DIAGNOSIS — Z860100 Personal history of colon polyps, unspecified: Secondary | ICD-10-CM | POA: Diagnosis not present

## 2023-12-17 DIAGNOSIS — I10 Essential (primary) hypertension: Secondary | ICD-10-CM | POA: Diagnosis not present

## 2023-12-17 DIAGNOSIS — M150 Primary generalized (osteo)arthritis: Secondary | ICD-10-CM | POA: Diagnosis not present

## 2023-12-17 DIAGNOSIS — E1169 Type 2 diabetes mellitus with other specified complication: Secondary | ICD-10-CM | POA: Diagnosis not present

## 2023-12-17 DIAGNOSIS — Z8639 Personal history of other endocrine, nutritional and metabolic disease: Secondary | ICD-10-CM | POA: Diagnosis not present

## 2023-12-17 NOTE — Telephone Encounter (Signed)
-----   Message from [facility] sent at 12/17/2023  4:28 PM EDT ----- Regarding: RE: recall colonoscopy EW, The recall was in the system from what I am seeing on not sure why it did not process however. I will get him in next available.  Makala Fetterolf, This patient needs next available colonoscopy 75-minute EMR with me.  Please get him on the books and let Dr. Elvan Hamel know when he has been scheduled. GM ----- Message ----- From: Aldean Hummingbird, MD Sent: 12/17/2023   4:15 PM EDT To: Normie Becton., MD Subject: recall colonoscopy                             Hi Gabe  I saw this pt for f/u after his sleeve gastrectomy. If you remember we did a concomitant procedure back in November 2024-incisional hernia repair and on table concomitant colonoscopy where you remove 2 large polyps.  I think he was due for recall colonoscopy 3 months after that but perhaps something changed based on the final path report.  I cannot really remember.  But I guess he definitely needs 1 within 1 year of November 2024.  Just pinging you about this  Thanks eric

## 2023-12-18 ENCOUNTER — Other Ambulatory Visit: Payer: Self-pay

## 2023-12-18 DIAGNOSIS — E66812 Obesity, class 2: Secondary | ICD-10-CM | POA: Diagnosis not present

## 2023-12-18 DIAGNOSIS — Z860100 Personal history of colon polyps, unspecified: Secondary | ICD-10-CM

## 2023-12-18 DIAGNOSIS — Z8639 Personal history of other endocrine, nutritional and metabolic disease: Secondary | ICD-10-CM | POA: Diagnosis not present

## 2023-12-18 MED ORDER — NA SULFATE-K SULFATE-MG SULF 17.5-3.13-1.6 GM/177ML PO SOLN: 1.0000 | Freq: Once | ORAL | 0 refills | Status: AC

## 2023-12-18 NOTE — Telephone Encounter (Signed)
 Colon EMR has been entered for 02/20/24 at 230 pm at [facility] with GM

## 2023-12-18 NOTE — Telephone Encounter (Signed)
 Colon EMR scheduled, pt instructed and medications reviewed.  Patient instructions mailed to home.  Patient to call with any questions or concerns.

## 2023-12-18 NOTE — Telephone Encounter (Signed)
 Error time for case is 145 pm

## 2023-12-19 DIAGNOSIS — I10 Essential (primary) hypertension: Secondary | ICD-10-CM | POA: Diagnosis not present

## 2023-12-19 DIAGNOSIS — H6120 Impacted cerumen, unspecified ear: Secondary | ICD-10-CM | POA: Diagnosis not present

## 2023-12-19 DIAGNOSIS — K0500 Acute gingivitis, plaque induced: Secondary | ICD-10-CM | POA: Diagnosis not present

## 2023-12-19 DIAGNOSIS — E118 Type 2 diabetes mellitus with unspecified complications: Secondary | ICD-10-CM | POA: Diagnosis not present

## 2024-01-18 DIAGNOSIS — E118 Type 2 diabetes mellitus with unspecified complications: Secondary | ICD-10-CM | POA: Diagnosis not present

## 2024-01-18 DIAGNOSIS — I10 Essential (primary) hypertension: Secondary | ICD-10-CM | POA: Diagnosis not present

## 2024-02-12 ENCOUNTER — Telehealth: Payer: Self-pay | Admitting: Gastroenterology

## 2024-02-12 NOTE — Telephone Encounter (Signed)
 Procedure:Colonoscopy Procedure date: 02/20/24 Procedure location: [facility] Arrival Time: 11:45 am Spoke with the patient Y/N: Yes Any prep concerns? No  Has the patient obtained the prep from the pharmacy ? Yes Do you have a care partner and transportation: Yes Any additional concerns? No

## 2024-02-13 ENCOUNTER — Encounter (HOSPITAL_COMMUNITY): Payer: Self-pay | Admitting: Gastroenterology

## 2024-02-13 NOTE — Progress Notes (Signed)
 Attempted to obtain medical history for pre op call via telephone, unable to reach at this time. HIPAA compliant voicemail message left requesting return call to pre surgical testing department.

## 2024-02-18 DIAGNOSIS — I10 Essential (primary) hypertension: Secondary | ICD-10-CM | POA: Diagnosis not present

## 2024-02-18 DIAGNOSIS — E118 Type 2 diabetes mellitus with unspecified complications: Secondary | ICD-10-CM | POA: Diagnosis not present

## 2024-02-20 ENCOUNTER — Ambulatory Visit (HOSPITAL_COMMUNITY)
Admission: RE | Admit: 2024-02-20 | Discharge: 2024-02-20 | Disposition: A | Discharge status: Home / Self Care | Attending: Gastroenterology | Admitting: Gastroenterology

## 2024-02-20 ENCOUNTER — Ambulatory Visit (HOSPITAL_COMMUNITY): Admitting: Certified Registered"

## 2024-02-20 ENCOUNTER — Encounter (HOSPITAL_COMMUNITY)
Admission: RE | Disposition: A | Discharge status: Home / Self Care | Payer: Self-pay | Source: Home / Self Care | Attending: Gastroenterology

## 2024-02-20 ENCOUNTER — Ambulatory Visit (HOSPITAL_BASED_OUTPATIENT_CLINIC_OR_DEPARTMENT_OTHER): Admitting: Certified Registered"

## 2024-02-20 ENCOUNTER — Encounter (HOSPITAL_COMMUNITY): Payer: Self-pay | Admitting: Gastroenterology

## 2024-02-20 ENCOUNTER — Other Ambulatory Visit: Payer: Self-pay

## 2024-02-20 DIAGNOSIS — E119 Type 2 diabetes mellitus without complications: Secondary | ICD-10-CM | POA: Insufficient documentation

## 2024-02-20 DIAGNOSIS — Z8249 Family history of ischemic heart disease and other diseases of the circulatory system: Secondary | ICD-10-CM | POA: Diagnosis not present

## 2024-02-20 DIAGNOSIS — Z833 Family history of diabetes mellitus: Secondary | ICD-10-CM | POA: Diagnosis not present

## 2024-02-20 DIAGNOSIS — K219 Gastro-esophageal reflux disease without esophagitis: Secondary | ICD-10-CM | POA: Insufficient documentation

## 2024-02-20 DIAGNOSIS — Z860100 Personal history of colon polyps, unspecified: Secondary | ICD-10-CM | POA: Diagnosis not present

## 2024-02-20 DIAGNOSIS — K6389 Other specified diseases of intestine: Secondary | ICD-10-CM | POA: Diagnosis not present

## 2024-02-20 DIAGNOSIS — I10 Essential (primary) hypertension: Secondary | ICD-10-CM | POA: Insufficient documentation

## 2024-02-20 DIAGNOSIS — Z85038 Personal history of other malignant neoplasm of large intestine: Secondary | ICD-10-CM | POA: Diagnosis not present

## 2024-02-20 DIAGNOSIS — Z860101 Personal history of adenomatous and serrated colon polyps: Secondary | ICD-10-CM

## 2024-02-20 DIAGNOSIS — D124 Benign neoplasm of descending colon: Secondary | ICD-10-CM | POA: Diagnosis not present

## 2024-02-20 DIAGNOSIS — L818 Other specified disorders of pigmentation: Secondary | ICD-10-CM

## 2024-02-20 DIAGNOSIS — J449 Chronic obstructive pulmonary disease, unspecified: Secondary | ICD-10-CM | POA: Diagnosis not present

## 2024-02-20 DIAGNOSIS — Z1211 Encounter for screening for malignant neoplasm of colon: Secondary | ICD-10-CM

## 2024-02-20 DIAGNOSIS — Z87891 Personal history of nicotine dependence: Secondary | ICD-10-CM

## 2024-02-20 DIAGNOSIS — K644 Residual hemorrhoidal skin tags: Secondary | ICD-10-CM | POA: Insufficient documentation

## 2024-02-20 DIAGNOSIS — Z9889 Other specified postprocedural states: Secondary | ICD-10-CM | POA: Diagnosis not present

## 2024-02-20 DIAGNOSIS — J4489 Other specified chronic obstructive pulmonary disease: Secondary | ICD-10-CM | POA: Diagnosis not present

## 2024-02-20 DIAGNOSIS — K641 Second degree hemorrhoids: Secondary | ICD-10-CM

## 2024-02-20 HISTORY — PX: COLONOSCOPY: SHX5424

## 2024-02-20 LAB — GLUCOSE, CAPILLARY: Glucose-Capillary: 90 mg/dL (ref 70–99)

## 2024-02-20 SURGERY — COLONOSCOPY
Anesthesia: Monitor Anesthesia Care

## 2024-02-20 MED ORDER — SODIUM CHLORIDE 0.9 % IV SOLN: INTRAVENOUS | Status: DC

## 2024-02-20 MED ORDER — PROPOFOL 10 MG/ML IV BOLUS
INTRAVENOUS | Status: DC | PRN
  Administered 2024-02-20: 20 mg via INTRAVENOUS
  Administered 2024-02-20: 10 mg via INTRAVENOUS
  Administered 2024-02-20: 50 mg via INTRAVENOUS

## 2024-02-20 MED ORDER — PROPOFOL 500 MG/50ML IV EMUL
INTRAVENOUS | Status: DC | PRN
  Administered 2024-02-20: 150 ug/kg/min via INTRAVENOUS

## 2024-02-20 MED ORDER — LIDOCAINE 2% (20 MG/ML) 5 ML SYRINGE
INTRAMUSCULAR | Status: DC | PRN
  Administered 2024-02-20: 40 mg via INTRAVENOUS

## 2024-02-20 NOTE — Transfer of Care (Signed)
 Immediate Anesthesia Transfer of Care Note  Patient: Travis Blanchard  Procedure(s) Performed: COLONOSCOPY RESECTION, MUCOSAL LESION, GI TRACT, ENDOSCOPIC  Patient Location: PACU  Anesthesia Type:MAC  Level of Consciousness: awake, alert , oriented, and patient cooperative  Airway & Oxygen Therapy: Patient Spontanous Breathing and Patient connected to face mask oxygen  Post-op Assessment: Report given to RN and Post -op Vital signs reviewed and stable  Post vital signs: Reviewed and stable  Last Vitals:  Vitals Value Taken Time  BP 94/64 02/20/24 14:31  Temp    Pulse 84 02/20/24 14:33  Resp 17 02/20/24 14:33  SpO2 99 % 02/20/24 14:33  Vitals shown include unfiled device data.  Last Pain:  Vitals:   02/20/24 1257  TempSrc: Temporal  PainSc: 0-No pain         Complications: No notable events documented.

## 2024-02-20 NOTE — H&P (Signed)
 GASTROENTEROLOGY PROCEDURE H&P NOTE   Primary Care Physician: Carlette Benita Area, MD  HPI: Travis Blanchard is a 78 y.o. male who presents for Colonoscopy for evaluation/surveillance of previous EMRs with one showing superficial cancer.  Past Medical History:  Diagnosis Date   B12 deficiency    Biochemically recurrent malignant neoplasm of prostate [facility]) 2007   urologist--- dr gay/  radiation onologist--- dr patrcia;  2007  s/p open prostatectomy;   local recurrent prostated cancer w/ PSA 5.73   COPD with asthma (HCC)    Diet-controlled type 2 diabetes mellitus (HCC)    followed by pcp;   (03-28-2023   per pt checks blood daily in am , not fasting)   Elevated PSA    GERD (gastroesophageal reflux disease)    History of adenomatous polyp of colon    History of Helicobacter pylori infection    EGD done @ [facility] by dr reed chronic h. 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 tanda (general surgeon)   Ventral incisional hernia    Vitamin D  deficiency    Wears glasses    Past Surgical History:  Procedure Laterality Date   COLONOSCOPY  08/01/2017   @[facility]  by dr reed   COLONOSCOPY N/A 05/21/2023   Procedure: COLONOSCOPY;  Surgeon: Wilhelmenia Aloha Raddle., MD;  Location: [facility];  Service: Gastroenterology;  Laterality: N/A;   COLONOSCOPY WITH PROPOFOL  N/A 07/18/2017   Procedure: COLONOSCOPY WITH PROPOFOL ;  Surgeon: Shaaron Lamar HERO, MD;  Location[facility];  Service: Endoscopy;  Laterality: N/A;  8:45   ENDOSCOPIC MUCOSAL RESECTION  05/21/2023   Procedure: ENDOSCOPIC MUCOSAL RESECTION;  Surgeon: Wilhelmenia, Aloha Raddle., MD;  Location: [facility];  Service: Gastroenterology;;    ESOPHAGOGASTRODUODENOSCOPY     HEMORRHOID SURGERY  1974   HEMOSTASIS CLIP PLACEMENT  05/21/2023   Procedure: HEMOSTASIS CLIP PLACEMENT;  Surgeon: Wilhelmenia Aloha Raddle., MD;  Location: [facility];  Service: Gastroenterology;;   HEMOSTASIS CONTROL  05/21/2023   Procedure: HEMOSTASIS CONTROL;  Surgeon: Wilhelmenia Aloha Raddle., MD;  Location: [facility];  Service: Gastroenterology;;   KNEE ARTHROSCOPY Right    1985;  1990   LAPAROSCOPIC GASTRIC SLEEVE RESECTION N/A 03/06/2022   Procedure: LAPAROSCOPIC SLEEVE GASTRECTOMY;  Surgeon: tanda Locus, MD;  Location: [facility];  Service: General;  Laterality: N/A;   LAPAROSCOPIC PARTIAL COLECTOMY N/A 05/21/2023   Procedure: LAPAROSCOPIC ASSISTED REPAIR OF INCARCERATED INCISIONAL HERNIA; LYSIS OF ADHESIONS;  Surgeon: tanda Locus, MD;  Location: [facility];  Service: General;  Laterality: N/A;   POLYPECTOMY  05/21/2023   Procedure: POLYPECTOMY;  Surgeon: Wilhelmenia Aloha Raddle., MD;  Location: [facility];  Service: Gastroenterology;;   PROSTATECTOMY  2007   open   SUBMUCOSAL LIFTING INJECTION  05/21/2023   Procedure: SUBMUCOSAL LIFTING INJECTION;  Surgeon: Wilhelmenia Aloha Raddle., MD;  Location: [facility];  Service: Gastroenterology;;   SUBMUCOSAL TATTOO INJECTION  05/21/2023   Procedure: SUBMUCOSAL TATTOO INJECTION;  Surgeon: Wilhelmenia Aloha Raddle., MD;  Location: [facility];  Service: Gastroenterology;;   TOTAL HIP ARTHROPLASTY Right 08/21/2022   Procedure: TOTAL HIP ARTHROPLASTY ANTERIOR APPROACH;  Surgeon: Ernie Cough, MD;  Location: [facility];  Service: Orthopedics;  Laterality: Right;  UPPER GI ENDOSCOPY N/A 03/06/2022   Procedure: UPPER GI ENDOSCOPY;  Surgeon: Tanda Locus, MD;  Location: [facility];  Service: General;  Laterality: N/A;   Current Facility-Administered Medications  Medication Dose Route Frequency Provider Last Rate Last Admin   0.9 %  sodium chloride  infusion   Intravenous Continuous Mansouraty, Aloha Raddle., MD 20 mL/hr at 02/20/24 1327 New Bag at 02/20/24  1327    Current Facility-Administered Medications:    0.9 %  sodium chloride  infusion, , Intravenous, Continuous, Mansouraty, Aloha Raddle., MD, Last Rate: 20 mL/hr at 02/20/24 1327, New Bag at 02/20/24 1327 Allergies  Allergen Reactions   Lisinopril Swelling    Facial swelling   Penicillins Anaphylaxis, Hives, Shortness Of Breath, Itching and Other (See Comments)    Has patient had a PCN reaction causing immediate rash, facial/tongue/throat swelling, SOB or lightheadedness with hypotension: Yes Has patient had a PCN reaction causing severe rash involving mucus membranes or skin necrosis: No Has patient had a PCN reaction that required hospitalization: No Has patient had a PCN reaction occurring within the last 10 years: No If all of the above answers are NO, then may proceed with Cephalosporin use.    Family History  Problem Relation Age of Onset   Diabetes Mother    Hypertension Mother    Hyperlipidemia Mother    Stroke Mother    Heart disease Mother    Depression Mother    Obesity Mother    Eating disorder Mother    Cancer Father    Depression Father    Anxiety disorder Father    Liver disease Father    Alcoholism Father    Pancreatic cancer Father    Cancer Sister        Breast   Diabetes Sister    Colon cancer Neg Hx    Esophageal cancer Neg Hx    Inflammatory bowel disease Neg Hx    Rectal cancer Neg Hx    Stomach cancer Neg Hx    Social History   Socioeconomic History   Marital status: Married    Spouse name: Whitley Strycharz   Number of children: Not on file   Years of education: Not on file   Highest education level: Not on file  Occupational History   Occupation: Musician  Tobacco Use   Smoking status: Former    Current packs/day: 0.00    Average packs/day: 1 pack/day for 25.0 years (25.0 ttl pk-yrs)    Types: Cigarettes    Start date: 03/11/1977    Quit date: 03/11/2002    Years since quitting: 21.9   Smokeless tobacco: Never   Tobacco  comments:    03-28-2023  per pt quit smoking 2003 ,  smoked started age 73  Vaping Use   Vaping status: Never Used  Substance and Sexual Activity   Alcohol  use: Yes    Comment: occasional   Drug use: Never   Sexual activity: Not on file  Other Topics Concern   Not on file  Social History Narrative   Lives with wife.     Social Drivers of Corporate investment banker Strain: Not on file  Food Insecurity: No Food Insecurity (06/26/2023)   Hunger Vital Sign    Worried About Running Out of Food in the Last Year: Never true    Ran Out of Food in the Last Year: Never true  Transportation Needs: No Transportation Needs (06/26/2023)   PRAPARE - Administrator, Civil Service (Medical): No  Lack of Transportation (Non-Medical): No  Physical Activity: Not on file  Stress: Not on file  Social Connections: Not on file  Intimate Partner Violence: Not At Risk (06/26/2023)   Humiliation, Afraid, Rape, and Kick questionnaire    Fear of Current or Ex-Partner: No    Emotionally Abused: No    Physically Abused: No    Sexually Abused: No    Physical Exam: Today's Vitals   02/19/24 1115 02/19/24 1119 02/20/24 1257  BP:   131/69  Pulse:   85  Resp:   16  Temp:   97.8 F (36.6 C)  TempSrc:   Temporal  SpO2:   98%  Weight: 117.9 kg  117.9 kg  Height:  5' 10 (1.778 m) 5' 10 (1.778 m)  PainSc:   0-No pain   Body mass index is 37.29 kg/m. GEN: NAD EYE: Sclerae anicteric ENT: MMM CV: Non-tachycardic GI: Soft, NT/ND NEURO:  Alert & Oriented x 3  Lab Results: No results for input(s): WBC, HGB, HCT, PLT in the last 72 hours. BMET No results for input(s): NA, K, CL, CO2, GLUCOSE, BUN, CREATININE, CALCIUM  in the last 72 hours. LFT No results for input(s): PROT, ALBUMIN, AST, ALT, ALKPHOS, BILITOT, BILIDIR, IBILI in the last 72 hours. PT/INR No results for input(s): LABPROT, INR in the last 72 hours.   Impression /  Plan: This is a 78 y.o.male who presents for Colonoscopy for evaluation/surveillance of previous EMRs with one showing superficial cancer.  The risks and benefits of endoscopic evaluation/treatment were discussed with the patient and/or family; these include but are not limited to the risk of perforation, infection, bleeding, missed lesions, lack of diagnosis, severe illness requiring hospitalization, as well as anesthesia and sedation related illnesses.  The patient's history has been reviewed, patient examined, no change in status, and deemed stable for procedure.  The patient and/or family is agreeable to proceed.    Aloha Finner, MD [facility] Advanced [facility]oscopy Office # [phone]

## 2024-02-20 NOTE — Anesthesia Preprocedure Evaluation (Addendum)
 Anesthesia Evaluation  Patient identified by MRN, date of birth, ID band Patient awake    Reviewed: Allergy & Precautions, NPO status , Patient's Chart, lab work & pertinent test results  Airway Mallampati: II  TM Distance: >3 FB Neck ROM: Full    Dental no notable dental hx.    Pulmonary asthma , COPD,  COPD inhaler, former smoker   Pulmonary exam normal        Cardiovascular hypertension, Pt. on medications Normal cardiovascular exam     Neuro/Psych negative neurological ROS  negative psych ROS   GI/Hepatic Neg liver ROS,GERD  Medicated and Controlled,,  Endo/Other  diabetes, Oral Hypoglycemic Agents    Renal/GU negative Renal ROS     Musculoskeletal   Abdominal  (+) + obese  Peds  Hematology negative hematology ROS (+)   Anesthesia Other Findings Pseudopolyposis of colon, unspecified complication status, unspecified part of colon   Reproductive/Obstetrics                              Anesthesia Physical Anesthesia Plan  ASA: 3  Anesthesia Plan: MAC   Post-op Pain Management:    Induction: Intravenous  PONV Risk Score and Plan: 1 and Propofol  infusion and Treatment may vary due to age or medical condition  Airway Management Planned: Simple Face Mask  Additional Equipment:   Intra-op Plan:   Post-operative Plan:   Informed Consent: I have reviewed the patients History and Physical, chart, labs and discussed the procedure including the risks, benefits and alternatives for the proposed anesthesia with the patient or authorized representative who has indicated his/her understanding and acceptance.     Dental advisory given  Plan Discussed with: CRNA  Anesthesia Plan Comments:         Anesthesia Quick Evaluation

## 2024-02-20 NOTE — Discharge Instructions (Signed)

## 2024-02-20 NOTE — Anesthesia Postprocedure Evaluation (Signed)
 Anesthesia Post Note  Patient: Travis Blanchard  Procedure(s) Performed: COLONOSCOPY RESECTION, MUCOSAL LESION, GI TRACT, ENDOSCOPIC     Patient location during evaluation: Endoscopy Anesthesia Type: MAC Level of consciousness: awake Pain management: pain level controlled Vital Signs Assessment: post-procedure vital signs reviewed and stable Respiratory status: spontaneous breathing, nonlabored ventilation and respiratory function stable Cardiovascular status: blood pressure returned to baseline and stable Postop Assessment: no apparent nausea or vomiting Anesthetic complications: no   No notable events documented.  Last Vitals:  Vitals:   02/20/24 1440 02/20/24 1450  BP: (!) 141/96 (!) 151/96  Pulse: 82 61  Resp: 14 15  Temp:    SpO2: 99% 98%    Last Pain:  Vitals:   02/20/24 1450  TempSrc:   PainSc: 0-No pain                 Travis Blanchard

## 2024-02-20 NOTE — Op Note (Signed)
 [facility] Patient Name: Travis Blanchard Procedure Date: 02/20/2024 MRN: [id number] Attending MD: Aloha Finner , MD, [id number] Date of Birth: 15-Sep-1945 CSN: [id number] Age: 78 Admit Type: Outpatient Procedure:                Colonoscopy Indications:              High risk colon cancer surveillance: Personal                            history of adenoma with high grade dysplasia, High                            risk colon cancer surveillance: Personal history of                            adenoma with villous component, High risk colon                            cancer surveillance: Personal history of colon                            cancer Providers:                Aloha Finner, MD, Robie Breed, RN,                            Haskel Chris, Technician Referring MD:              Medicines:                Monitored Anesthesia Care Complications:            No immediate complications. Estimated Blood Loss:     Estimated blood loss was minimal. Procedure:                Pre-Anesthesia Assessment:                           - Prior to the procedure, a History and Physical                            was performed, and patient medications and                            allergies were reviewed. The patient's tolerance of                            previous anesthesia was also reviewed. The risks                            and benefits of the procedure and the sedation                            options and risks were discussed with the patient.                            All questions were answered,  and informed consent                            was obtained. Prior Anticoagulants: The patient has                            taken no anticoagulant or antiplatelet agents. ASA                            Grade Assessment: III - A patient with severe                            systemic disease. After reviewing the risks and                             benefits, the patient was deemed in satisfactory                            condition to undergo the procedure.                           After obtaining informed consent, the colonoscope                            was passed under direct vision. Throughout the                            procedure, the patient's blood pressure, pulse, and                            oxygen saturations were monitored continuously. The                            CF-HQ190L ([id number]) Olympus colonoscope was                            introduced through the anus and advanced to the the                            cecum, identified by appendiceal orifice and                            ileocecal valve. The colonoscopy was performed                            without difficulty. The patient tolerated the                            procedure. The quality of the bowel preparation was                            adequate. The ileocecal valve, appendiceal orifice,  and rectum were photographed. Scope In: 1:54:07 PM Scope Out: 2:25:10 PM Scope Withdrawal Time: 0 hours 27 minutes 35 seconds  Total Procedure Duration: 0 hours 31 minutes 3 seconds  Findings:      The digital rectal exam findings include hemorrhoids. Pertinent       negatives include no palpable rectal lesions.      A moderate amount of semi-liquid stool was found in the entire colon,       interfering with visualization.      A medium post mucosectomy scar was found at the hepatic flexure. Tattoo       noted distal to this area. There was no evidence of the previous polyp.       Difficult to visualize even in retroflexion      A medium post mucosectomy scar was found in the descending colon. There       was granular tissue. Biopsies were taken with a cold forceps for       histology to rule out adenomatous change.      A 3 mm polyp was found in the descending colon. The polyp was sessile.       The polyp was removed with a cold  snare. Resection and retrieval were       complete.      Non-bleeding non-thrombosed external and internal hemorrhoids were found       during retroflexion, during perianal exam and during digital exam. The       hemorrhoids were Grade II (internal hemorrhoids that prolapse but reduce       spontaneously). Impression:               - Hemorrhoids found on digital rectal exam.                           - Stool in the entire examined colon. Lavaged with                            adequate visualization.                           - Post mucosectomy scar at the hepatic flexure.                            Difficult to visualize even in retroflexion.                           - Post mucosectomy scar in the descending colon.                            Granular tissue noted. Biopsied.                           - One 3 mm polyp in the descending colon, removed                            with a cold snare. Resected and retrieved.                           - Non-bleeding non-thrombosed external and internal  hemorrhoids. Moderate Sedation:      Not Applicable - Patient had care per Anesthesia. Recommendation:           - The patient will be observed post-procedure,                            until all discharge criteria are met.                           - Discharge patient to home.                           - Continue present medications.                           - Await pathology results.                           - Repeat colonoscopy for surveillance based on                            pathology results.                           - Will recommend CTAP and CEA level pending final                            pathology.                           - The findings and recommendations were discussed                            with the patient.                           - The findings and recommendations were discussed                            with the patient's  family. Procedure Code(s):        --- Professional ---                           [phone], Colonoscopy, flexible; with removal of                            tumor(s), polyp(s), or other lesion(s) by snare                            technique                           45380, 59, Colonoscopy, flexible; with biopsy,                            single or multiple Diagnosis Code(s):        --- Professional ---  Z86.010, Personal history of colonic polyps                           Z85.038, Personal history of other malignant                            neoplasm of large intestine                           K64.1, Second degree hemorrhoids                           Z98.890, Other specified postprocedural states                           D12.4, Benign neoplasm of descending colon CPT copyright 2022 [employer]. All rights reserved. The codes documented in this report are preliminary and upon coder review may  be revised to meet current compliance requirements. Aloha Finner, MD 02/20/2024 3:09:11 PM Number of Addenda: 0

## 2024-02-24 LAB — SURGICAL PATHOLOGY

## 2024-02-25 ENCOUNTER — Encounter (HOSPITAL_COMMUNITY): Payer: Self-pay | Admitting: Gastroenterology

## 2024-02-27 ENCOUNTER — Ambulatory Visit: Payer: Self-pay | Admitting: Gastroenterology

## 2024-04-01 DIAGNOSIS — E785 Hyperlipidemia, unspecified: Secondary | ICD-10-CM | POA: Diagnosis not present

## 2024-04-01 DIAGNOSIS — I10 Essential (primary) hypertension: Secondary | ICD-10-CM | POA: Diagnosis not present

## 2024-04-01 DIAGNOSIS — J449 Chronic obstructive pulmonary disease, unspecified: Secondary | ICD-10-CM | POA: Diagnosis not present

## 2024-04-01 DIAGNOSIS — E118 Type 2 diabetes mellitus with unspecified complications: Secondary | ICD-10-CM | POA: Diagnosis not present

## 2024-04-21 DIAGNOSIS — H25813 Combined forms of age-related cataract, bilateral: Secondary | ICD-10-CM | POA: Diagnosis not present

## 2024-04-21 DIAGNOSIS — H18413 Arcus senilis, bilateral: Secondary | ICD-10-CM | POA: Diagnosis not present

## 2024-04-21 DIAGNOSIS — E119 Type 2 diabetes mellitus without complications: Secondary | ICD-10-CM | POA: Diagnosis not present

## 2024-04-28 DIAGNOSIS — R350 Frequency of micturition: Secondary | ICD-10-CM | POA: Diagnosis not present

## 2024-04-28 DIAGNOSIS — C61 Malignant neoplasm of prostate: Secondary | ICD-10-CM | POA: Diagnosis not present

## 2024-04-28 DIAGNOSIS — R3912 Poor urinary stream: Secondary | ICD-10-CM | POA: Diagnosis not present

## 2024-04-28 DIAGNOSIS — R3914 Feeling of incomplete bladder emptying: Secondary | ICD-10-CM | POA: Diagnosis not present

## 2024-04-28 DIAGNOSIS — N400 Enlarged prostate without lower urinary tract symptoms: Secondary | ICD-10-CM | POA: Diagnosis not present

## 2024-04-28 DIAGNOSIS — R351 Nocturia: Secondary | ICD-10-CM | POA: Diagnosis not present

## 2024-05-01 DIAGNOSIS — I10 Essential (primary) hypertension: Secondary | ICD-10-CM | POA: Diagnosis not present

## 2024-05-01 DIAGNOSIS — E118 Type 2 diabetes mellitus with unspecified complications: Secondary | ICD-10-CM | POA: Diagnosis not present

## 2024-05-06 DIAGNOSIS — M170 Bilateral primary osteoarthritis of knee: Secondary | ICD-10-CM | POA: Diagnosis not present

## 2024-05-10 DIAGNOSIS — M1712 Unilateral primary osteoarthritis, left knee: Secondary | ICD-10-CM | POA: Insufficient documentation

## 2024-05-11 ENCOUNTER — Telehealth (HOSPITAL_BASED_OUTPATIENT_CLINIC_OR_DEPARTMENT_OTHER): Payer: Self-pay | Admitting: *Deleted

## 2024-05-11 NOTE — Telephone Encounter (Signed)
   Pre-operative Risk Assessment    Patient Name: Travis Blanchard  DOB: Nov 30, 1945 MRN: [id number]   Date of last office visit: 08/17/2022 Date of next office visit: None  Request for Surgical Clearance    Procedure:  Right Total Knee Arthroplasty  Date of Surgery:  Clearance 08/25/24                                 Surgeon:  Dr. Donnice Car Surgeon's Group or Practice Name:  [facility] Phone number:  [phone] Fax number:  [phone]   Type of Clearance Requested:   - Medical    Type of Anesthesia:  Not Indicated   Additional requests/questions:    Signed, Edsel Grayce Sanders   05/11/2024, 9:33 AM

## 2024-05-11 NOTE — Telephone Encounter (Signed)
 Patient has been scheduled for in office

## 2024-05-11 NOTE — Telephone Encounter (Signed)
 Primary Cardiologist:James Hochrein, MD  Chart reviewed as part of pre-operative protocol coverage. Because of Travis Blanchard's past medical history and time since last visit, he/she will require a follow-up visit in order to better assess preoperative cardiovascular risk.  Pre-op covering staff: - Please schedule appointment and call patient to inform them (schedule after 06/24/24) since clearance is only applicable for < 2 months.  - Please contact requesting surgeon's office via preferred method (i.e, phone, fax) to inform them of need for appointment prior to surgery.  [name], NP-C  05/11/2024, 12:37 PM [address] [place], [place] [address] Office [phone] Fax [phone]

## 2024-06-01 DIAGNOSIS — I10 Essential (primary) hypertension: Secondary | ICD-10-CM | POA: Diagnosis not present

## 2024-06-01 DIAGNOSIS — E118 Type 2 diabetes mellitus with unspecified complications: Secondary | ICD-10-CM | POA: Diagnosis not present

## 2024-06-11 ENCOUNTER — Other Ambulatory Visit: Payer: Self-pay | Admitting: Gastroenterology

## 2024-07-21 ENCOUNTER — Other Ambulatory Visit: Payer: Self-pay

## 2024-07-21 DIAGNOSIS — Z87898 Personal history of other specified conditions: Secondary | ICD-10-CM | POA: Insufficient documentation

## 2024-07-21 DIAGNOSIS — M1990 Unspecified osteoarthritis, unspecified site: Secondary | ICD-10-CM | POA: Insufficient documentation

## 2024-07-21 DIAGNOSIS — E559 Vitamin D deficiency, unspecified: Secondary | ICD-10-CM | POA: Insufficient documentation

## 2024-07-21 DIAGNOSIS — J4489 Other specified chronic obstructive pulmonary disease: Secondary | ICD-10-CM | POA: Insufficient documentation

## 2024-07-21 DIAGNOSIS — E538 Deficiency of other specified B group vitamins: Secondary | ICD-10-CM | POA: Insufficient documentation

## 2024-07-21 DIAGNOSIS — R351 Nocturia: Secondary | ICD-10-CM | POA: Insufficient documentation

## 2024-07-21 DIAGNOSIS — Z973 Presence of spectacles and contact lenses: Secondary | ICD-10-CM | POA: Insufficient documentation

## 2024-07-21 DIAGNOSIS — K219 Gastro-esophageal reflux disease without esophagitis: Secondary | ICD-10-CM | POA: Insufficient documentation

## 2024-07-21 DIAGNOSIS — R972 Elevated prostate specific antigen [PSA]: Secondary | ICD-10-CM | POA: Insufficient documentation

## 2024-07-22 DIAGNOSIS — E118 Type 2 diabetes mellitus with unspecified complications: Secondary | ICD-10-CM | POA: Insufficient documentation

## 2024-07-22 NOTE — Progress Notes (Signed)
" °  Cardiology Office Note:   Date:  07/23/2024  ID:  Travis Blanchard, DOB 1946/04/13, MRN [id number] PCP: Carlette Benita Area, MD  [facility] Providers Cardiologist:  Lynwood Schilling, MD {  History of Present Illness:   Travis Blanchard is a 79 y.o. male who presents for evaluation of shortness of breath. He had a perfusion study in April 2023 and had no ischemia.  There was a fixed inferior defect.  He is going to have knee surgery.  He is here preoperatively.  He has had no new cardiovascular complaints.  He takes care of his wife and he has to do all the household chores.  He goes grocery shopping. The patient denies any new symptoms such as chest discomfort, neck or arm discomfort. There has been no new shortness of breath, PND or orthopnea. There have been no reported palpitations, presyncope or syncope.  He previously had shortness of breath but he does not note this anymore when he walks 30 yards up an incline from his mailbox.   ROS: As stated in the HPI and negative for all other systems.  Studies Reviewed:    EKG:   EKG Interpretation Date/Time:  Thursday July 23 2024 12:04:49 EST Ventricular Rate:  83 PR Interval:  198 QRS Duration:  80 QT Interval:  380 QTC Calculation: 446 R Axis:   -46  Text Interpretation: Normal sinus rhythm Left anterior fascicular block When compared with ECG of 28-May-2023 08:39, No significant change since last tracing Confirmed by Schilling Rattan ([id number]) on 07/23/2024 12:33:17 PM    Risk Assessment/Calculations:              Physical Exam:   VS:  BP 112/73   Pulse 83   Ht 5' 10 (1.778 m)   Wt 274 lb 12.8 oz (124.6 kg)   SpO2 98%   BMI 39.43 kg/m    Wt Readings from Last 3 Encounters:  07/23/24 274 lb 12.8 oz (124.6 kg)  02/20/24 259 lb 14.8 oz (117.9 kg)  05/28/23 257 lb (116.6 kg)     GEN: Well nourished, well developed in no acute distress NECK: No JVD; No carotid bruits CARDIAC: RRR, no murmurs, rubs,  gallops RESPIRATORY:  Clear to auscultation without rales, wheezing or rhonchi  ABDOMEN: Soft, non-tender, non-distended EXTREMITIES:  No edema; No deformity   ASSESSMENT AND PLAN:   SOB: The patient has baseline mild dyspnea and has had no new symptoms since his last stress testing.  No change in therapy.    DM: A1c is 6.3 and being managed by his primary provider.  No change in therapy.  HTN: Blood pressure is well-controlled.  He will continue meds as listed.  Preop: The patient is going for knee surgery.  This is not a high risk test.  He is at a very good functional level.  He has no high risk findings or symptoms.  He had a negative stress test in 2023.  Given this and according to ACC/AHA guidelines no further testing is suggested.  He is at acceptable risk for the planned surgery.     Follow up with me as needed.  Signed, Lynwood Schilling, MD   "

## 2024-07-23 ENCOUNTER — Ambulatory Visit: Attending: Cardiology | Admitting: Cardiology

## 2024-07-23 ENCOUNTER — Encounter: Payer: Self-pay | Admitting: Cardiology

## 2024-07-23 VITALS — BP 112/73 | HR 83 | Ht 70.0 in | Wt 274.8 lb

## 2024-07-23 DIAGNOSIS — R0602 Shortness of breath: Secondary | ICD-10-CM | POA: Insufficient documentation

## 2024-07-23 DIAGNOSIS — I10 Essential (primary) hypertension: Secondary | ICD-10-CM | POA: Diagnosis not present

## 2024-07-23 DIAGNOSIS — E118 Type 2 diabetes mellitus with unspecified complications: Secondary | ICD-10-CM | POA: Diagnosis not present

## 2024-07-23 NOTE — Patient Instructions (Signed)

## 2024-08-25 ENCOUNTER — Ambulatory Visit (HOSPITAL_COMMUNITY): Admit: 2024-08-25 | Admitting: Orthopedic Surgery
